# Patient Record
Sex: Female | Born: 1940 | Race: White | Hispanic: No | Smoking: Never smoker
Health system: Southern US, Community
[De-identification: ages and names within clinical notes are randomized; demographics above are authoritative.]

## PROBLEM LIST (undated history)

## (undated) DIAGNOSIS — I10 Essential (primary) hypertension: Secondary | ICD-10-CM

## (undated) DIAGNOSIS — M1990 Unspecified osteoarthritis, unspecified site: Secondary | ICD-10-CM

## (undated) DIAGNOSIS — R42 Dizziness and giddiness: Secondary | ICD-10-CM

## (undated) DIAGNOSIS — E785 Hyperlipidemia, unspecified: Secondary | ICD-10-CM

## (undated) DIAGNOSIS — K5730 Diverticulosis of large intestine without perforation or abscess without bleeding: Secondary | ICD-10-CM

## (undated) DIAGNOSIS — R002 Palpitations: Secondary | ICD-10-CM

## (undated) DIAGNOSIS — C801 Malignant (primary) neoplasm, unspecified: Secondary | ICD-10-CM

## (undated) DIAGNOSIS — Z86010 Personal history of colonic polyps: Secondary | ICD-10-CM

## (undated) DIAGNOSIS — R030 Elevated blood-pressure reading, without diagnosis of hypertension: Secondary | ICD-10-CM

## (undated) HISTORY — DX: Unspecified osteoarthritis, unspecified site: M19.90

## (undated) HISTORY — PX: ROTATOR CUFF REPAIR: SHX139

## (undated) HISTORY — DX: Hyperlipidemia, unspecified: E78.5

## (undated) HISTORY — PX: LAMINECTOMY: SHX219

## (undated) HISTORY — DX: Personal history of colonic polyps: Z86.010

## (undated) HISTORY — DX: Dizziness and giddiness: R42

## (undated) HISTORY — DX: Malignant (primary) neoplasm, unspecified: C80.1

## (undated) HISTORY — PX: ABDOMINAL HYSTERECTOMY: SHX81

## (undated) HISTORY — DX: Diverticulosis of large intestine without perforation or abscess without bleeding: K57.30

## (undated) HISTORY — DX: Elevated blood-pressure reading, without diagnosis of hypertension: R03.0

## (undated) HISTORY — PX: VAGINAL HYSTERECTOMY: SUR661

## (undated) HISTORY — DX: Palpitations: R00.2

---

## 2004-07-18 ENCOUNTER — Ambulatory Visit: Payer: Self-pay | Admitting: Internal Medicine

## 2004-09-05 ENCOUNTER — Ambulatory Visit: Payer: Self-pay | Admitting: Internal Medicine

## 2004-12-10 ENCOUNTER — Ambulatory Visit: Payer: Self-pay | Admitting: Internal Medicine

## 2005-01-30 ENCOUNTER — Ambulatory Visit: Payer: Self-pay | Admitting: Internal Medicine

## 2005-02-24 ENCOUNTER — Encounter: Admission: RE | Admit: 2005-02-24 | Discharge: 2005-02-24 | Payer: Self-pay | Admitting: Specialist

## 2005-03-13 ENCOUNTER — Ambulatory Visit: Payer: Self-pay | Admitting: Internal Medicine

## 2005-03-21 ENCOUNTER — Ambulatory Visit (HOSPITAL_COMMUNITY): Admission: RE | Admit: 2005-03-21 | Discharge: 2005-03-22 | Payer: Self-pay | Admitting: Specialist

## 2005-03-21 ENCOUNTER — Encounter (INDEPENDENT_AMBULATORY_CARE_PROVIDER_SITE_OTHER): Payer: Self-pay | Admitting: Specialist

## 2005-10-04 ENCOUNTER — Ambulatory Visit (HOSPITAL_COMMUNITY): Admission: RE | Admit: 2005-10-04 | Discharge: 2005-10-05 | Payer: Self-pay | Admitting: Specialist

## 2005-11-28 ENCOUNTER — Ambulatory Visit: Payer: Self-pay | Admitting: Internal Medicine

## 2006-06-21 ENCOUNTER — Emergency Department (HOSPITAL_COMMUNITY): Admission: EM | Admit: 2006-06-21 | Discharge: 2006-06-21 | Payer: Self-pay | Admitting: Emergency Medicine

## 2006-06-24 ENCOUNTER — Encounter: Payer: Self-pay | Admitting: Internal Medicine

## 2006-06-27 ENCOUNTER — Ambulatory Visit: Payer: Self-pay | Admitting: Internal Medicine

## 2006-06-27 LAB — CONVERTED CEMR LAB
Albumin: 3.7 g/dL (ref 3.5–5.2)
Basophils Absolute: 0 10*3/uL (ref 0.0–0.1)
Bilirubin, Direct: 0.1 mg/dL (ref 0.0–0.3)
Cholesterol: 254 mg/dL (ref 0–200)
Direct LDL: 149.4 mg/dL
Eosinophils Absolute: 0.1 10*3/uL (ref 0.0–0.6)
Hemoglobin: 13.5 g/dL (ref 12.0–15.0)
Lymphocytes Relative: 25.4 % (ref 12.0–46.0)
MCV: 91.1 fL (ref 78.0–100.0)
Monocytes Relative: 7.5 % (ref 3.0–11.0)
Neutro Abs: 4.2 10*3/uL (ref 1.4–7.7)
TSH: 2.41 microintl units/mL (ref 0.35–5.50)
Total CHOL/HDL Ratio: 4.4
Total Protein: 7.5 g/dL (ref 6.0–8.3)
Triglycerides: 127 mg/dL (ref 0–149)
VLDL: 25 mg/dL (ref 0–40)

## 2006-07-01 ENCOUNTER — Encounter: Payer: Self-pay | Admitting: Internal Medicine

## 2006-07-01 DIAGNOSIS — E785 Hyperlipidemia, unspecified: Secondary | ICD-10-CM

## 2006-07-01 HISTORY — DX: Hyperlipidemia, unspecified: E78.5

## 2006-09-26 ENCOUNTER — Telehealth: Payer: Self-pay | Admitting: *Deleted

## 2006-10-23 ENCOUNTER — Ambulatory Visit: Payer: Self-pay | Admitting: Internal Medicine

## 2006-10-23 LAB — CONVERTED CEMR LAB
ALT: 27 units/L (ref 0–35)
AST: 22 units/L (ref 0–37)
BUN: 13 mg/dL (ref 6–23)
Bilirubin Urine: NEGATIVE
CO2: 33 meq/L — ABNORMAL HIGH (ref 19–32)
Chloride: 108 meq/L (ref 96–112)
Cholesterol: 202 mg/dL (ref 0–200)
GFR calc Af Amer: 108 mL/min
GFR calc non Af Amer: 90 mL/min
HCT: 37.1 % (ref 36.0–46.0)
HDL: 60.2 mg/dL (ref >39.0)
Hemoglobin: 12.6 g/dL (ref 12.0–15.0)
Lymphocytes Relative: 29.9 % (ref 12.0–46.0)
Neutro Abs: 2.9 10*3/uL (ref 1.4–7.7)
Neutrophils Relative %: 58.9 % (ref 43.0–77.0)
RBC: 4.03 M/uL (ref 3.87–5.11)
RDW: 13.9 % (ref 11.5–14.6)
Specific Gravity, Urine: 1.02
Total Bilirubin: 0.7 mg/dL (ref 0.3–1.2)
Triglycerides: 134 mg/dL (ref 0–149)
WBC: 4.9 10*3/uL (ref 4.5–10.5)
pH: 6.5

## 2006-10-31 ENCOUNTER — Ambulatory Visit: Payer: Self-pay | Admitting: Internal Medicine

## 2006-10-31 DIAGNOSIS — Z860100 Personal history of colon polyps, unspecified: Secondary | ICD-10-CM

## 2006-10-31 HISTORY — DX: Personal history of colon polyps, unspecified: Z86.0100

## 2006-10-31 HISTORY — DX: Personal history of colonic polyps: Z86.010

## 2006-11-03 ENCOUNTER — Telehealth: Payer: Self-pay | Admitting: Internal Medicine

## 2006-11-14 ENCOUNTER — Ambulatory Visit: Payer: Self-pay | Admitting: Internal Medicine

## 2006-11-14 ENCOUNTER — Encounter: Payer: Self-pay | Admitting: Internal Medicine

## 2006-11-24 ENCOUNTER — Ambulatory Visit: Payer: Self-pay | Admitting: Internal Medicine

## 2006-11-24 ENCOUNTER — Encounter: Payer: Self-pay | Admitting: Internal Medicine

## 2007-01-29 ENCOUNTER — Ambulatory Visit: Payer: Self-pay | Admitting: Internal Medicine

## 2007-01-29 ENCOUNTER — Telehealth: Payer: Self-pay | Admitting: Internal Medicine

## 2007-01-29 DIAGNOSIS — R1013 Epigastric pain: Secondary | ICD-10-CM | POA: Insufficient documentation

## 2007-01-30 ENCOUNTER — Encounter: Admission: RE | Admit: 2007-01-30 | Discharge: 2007-01-30 | Payer: Self-pay | Admitting: Internal Medicine

## 2007-01-30 ENCOUNTER — Telehealth: Payer: Self-pay | Admitting: Internal Medicine

## 2007-04-29 ENCOUNTER — Telehealth: Payer: Self-pay | Admitting: Internal Medicine

## 2007-07-06 ENCOUNTER — Ambulatory Visit: Payer: Self-pay | Admitting: Internal Medicine

## 2007-08-20 ENCOUNTER — Telehealth: Payer: Self-pay | Admitting: Internal Medicine

## 2007-08-21 ENCOUNTER — Ambulatory Visit: Payer: Self-pay | Admitting: Internal Medicine

## 2007-08-21 DIAGNOSIS — M549 Dorsalgia, unspecified: Secondary | ICD-10-CM | POA: Insufficient documentation

## 2007-08-28 ENCOUNTER — Encounter: Payer: Self-pay | Admitting: Internal Medicine

## 2007-11-02 ENCOUNTER — Telehealth: Payer: Self-pay | Admitting: Internal Medicine

## 2007-11-06 ENCOUNTER — Ambulatory Visit: Payer: Self-pay | Admitting: Internal Medicine

## 2007-11-24 ENCOUNTER — Telehealth: Payer: Self-pay | Admitting: Internal Medicine

## 2007-12-08 ENCOUNTER — Telehealth: Payer: Self-pay | Admitting: Internal Medicine

## 2007-12-30 ENCOUNTER — Ambulatory Visit: Payer: Self-pay | Admitting: Internal Medicine

## 2007-12-30 ENCOUNTER — Telehealth: Payer: Self-pay | Admitting: Internal Medicine

## 2008-01-07 ENCOUNTER — Encounter: Payer: Self-pay | Admitting: Internal Medicine

## 2008-01-14 ENCOUNTER — Ambulatory Visit: Payer: Self-pay | Admitting: Internal Medicine

## 2008-01-14 DIAGNOSIS — J069 Acute upper respiratory infection, unspecified: Secondary | ICD-10-CM | POA: Insufficient documentation

## 2008-02-15 ENCOUNTER — Ambulatory Visit: Payer: Self-pay | Admitting: Internal Medicine

## 2008-02-16 ENCOUNTER — Telehealth: Payer: Self-pay | Admitting: Internal Medicine

## 2008-02-19 ENCOUNTER — Telehealth: Payer: Self-pay | Admitting: Internal Medicine

## 2008-04-07 ENCOUNTER — Telehealth: Payer: Self-pay | Admitting: Internal Medicine

## 2008-04-08 ENCOUNTER — Telehealth: Payer: Self-pay | Admitting: Internal Medicine

## 2008-10-06 ENCOUNTER — Telehealth: Payer: Self-pay | Admitting: Internal Medicine

## 2008-10-21 ENCOUNTER — Ambulatory Visit: Payer: Self-pay | Admitting: Internal Medicine

## 2008-10-21 DIAGNOSIS — R002 Palpitations: Secondary | ICD-10-CM

## 2008-10-21 DIAGNOSIS — K5730 Diverticulosis of large intestine without perforation or abscess without bleeding: Secondary | ICD-10-CM | POA: Insufficient documentation

## 2008-10-21 HISTORY — DX: Diverticulosis of large intestine without perforation or abscess without bleeding: K57.30

## 2008-10-21 HISTORY — DX: Palpitations: R00.2

## 2009-01-18 ENCOUNTER — Encounter: Payer: Self-pay | Admitting: Internal Medicine

## 2009-06-13 ENCOUNTER — Ambulatory Visit: Payer: Self-pay | Admitting: Internal Medicine

## 2009-06-13 DIAGNOSIS — J029 Acute pharyngitis, unspecified: Secondary | ICD-10-CM

## 2009-07-12 ENCOUNTER — Ambulatory Visit: Payer: Self-pay | Admitting: Family Medicine

## 2009-07-12 DIAGNOSIS — R030 Elevated blood-pressure reading, without diagnosis of hypertension: Secondary | ICD-10-CM

## 2009-07-12 HISTORY — DX: Elevated blood-pressure reading, without diagnosis of hypertension: R03.0

## 2009-07-25 ENCOUNTER — Ambulatory Visit: Payer: Self-pay | Admitting: Family Medicine

## 2009-07-25 DIAGNOSIS — R42 Dizziness and giddiness: Secondary | ICD-10-CM | POA: Insufficient documentation

## 2009-07-25 DIAGNOSIS — H6120 Impacted cerumen, unspecified ear: Secondary | ICD-10-CM

## 2009-07-25 HISTORY — DX: Dizziness and giddiness: R42

## 2009-10-16 ENCOUNTER — Telehealth: Payer: Self-pay | Admitting: Internal Medicine

## 2009-10-16 ENCOUNTER — Ambulatory Visit: Payer: Self-pay | Admitting: Internal Medicine

## 2009-11-30 ENCOUNTER — Ambulatory Visit: Payer: Self-pay | Admitting: Internal Medicine

## 2009-12-18 ENCOUNTER — Telehealth: Payer: Self-pay | Admitting: Internal Medicine

## 2009-12-20 ENCOUNTER — Ambulatory Visit: Payer: Self-pay | Admitting: Internal Medicine

## 2009-12-20 DIAGNOSIS — M79609 Pain in unspecified limb: Secondary | ICD-10-CM

## 2009-12-25 ENCOUNTER — Telehealth: Payer: Self-pay | Admitting: Internal Medicine

## 2010-02-06 ENCOUNTER — Ambulatory Visit
Admission: RE | Admit: 2010-02-06 | Discharge: 2010-02-06 | Payer: Self-pay | Source: Home / Self Care | Attending: Internal Medicine | Admitting: Internal Medicine

## 2010-02-06 ENCOUNTER — Other Ambulatory Visit: Payer: Self-pay | Admitting: Internal Medicine

## 2010-02-06 ENCOUNTER — Encounter: Payer: Self-pay | Admitting: Internal Medicine

## 2010-02-06 DIAGNOSIS — E559 Vitamin D deficiency, unspecified: Secondary | ICD-10-CM | POA: Insufficient documentation

## 2010-02-06 LAB — CBC WITH DIFFERENTIAL/PLATELET
Basophils Absolute: 0 10*3/uL (ref 0.0–0.1)
Basophils Relative: 0.6 % (ref 0.0–3.0)
Eosinophils Absolute: 0.1 10*3/uL (ref 0.0–0.7)
Eosinophils Relative: 2.1 % (ref 0.0–5.0)
HCT: 39.3 % (ref 36.0–46.0)
Hemoglobin: 13.4 g/dL (ref 12.0–15.0)
Lymphocytes Relative: 19.6 % (ref 12.0–46.0)
Lymphs Abs: 1 10*3/uL (ref 0.7–4.0)
MCHC: 34.1 g/dL (ref 30.0–36.0)
MCV: 92.9 fl (ref 78.0–100.0)
Monocytes Absolute: 0.3 10*3/uL (ref 0.1–1.0)
Monocytes Relative: 6.9 % (ref 3.0–12.0)
Neutro Abs: 3.5 10*3/uL (ref 1.4–7.7)
Neutrophils Relative %: 70.8 % (ref 43.0–77.0)
Platelets: 300 10*3/uL (ref 150.0–400.0)
RBC: 4.24 Mil/uL (ref 3.87–5.11)
RDW: 14.5 % (ref 11.5–14.6)
WBC: 5 10*3/uL (ref 4.5–10.5)

## 2010-02-06 LAB — BASIC METABOLIC PANEL
BUN: 14 mg/dL (ref 6–23)
CO2: 30 mEq/L (ref 19–32)
Calcium: 9 mg/dL (ref 8.4–10.5)
Chloride: 105 mEq/L (ref 96–112)
Creatinine, Ser: 0.5 mg/dL (ref 0.4–1.2)
GFR: 119.36 mL/min (ref >60.00)
Glucose, Bld: 87 mg/dL (ref 70–99)
Potassium: 4.3 mEq/L (ref 3.5–5.1)
Sodium: 143 mEq/L (ref 135–145)

## 2010-02-06 LAB — HEPATIC FUNCTION PANEL
ALT: 19 U/L (ref 0–35)
AST: 18 U/L (ref 0–37)
Albumin: 3.9 g/dL (ref 3.5–5.2)
Alkaline Phosphatase: 73 U/L (ref 39–117)
Bilirubin, Direct: 0.1 mg/dL (ref 0.0–0.3)
Total Bilirubin: 0.7 mg/dL (ref 0.3–1.2)
Total Protein: 7.2 g/dL (ref 6.0–8.3)

## 2010-02-06 LAB — LIPID PANEL
Cholesterol: 231 mg/dL — ABNORMAL HIGH (ref 0–200)
HDL: 72.3 mg/dL (ref >39.00)
Total CHOL/HDL Ratio: 3
Triglycerides: 91 mg/dL (ref 0.0–149.0)
VLDL: 18.2 mg/dL (ref 0.0–40.0)

## 2010-02-06 LAB — TSH: TSH: 3.4 u[IU]/mL (ref 0.35–5.50)

## 2010-02-06 LAB — LDL CHOLESTEROL, DIRECT: Direct LDL: 145.3 mg/dL

## 2010-02-09 LAB — CONVERTED CEMR LAB: Vit D, 25-Hydroxy: 22 ng/mL — ABNORMAL LOW (ref 30–89)

## 2010-02-11 ENCOUNTER — Encounter: Payer: Self-pay | Admitting: Orthopedic Surgery

## 2010-02-11 ENCOUNTER — Encounter: Payer: Self-pay | Admitting: Specialist

## 2010-02-18 LAB — CONVERTED CEMR LAB
ALT: 28 units/L (ref 0–35)
AST: 21 units/L (ref 0–37)
Basophils Absolute: 0 10*3/uL (ref 0.0–0.1)
Bilirubin, Direct: 0.1 mg/dL (ref 0.0–0.3)
Creatinine, Ser: 0.5 mg/dL (ref 0.4–1.2)
Eosinophils Relative: 1.9 % (ref 0.0–5.0)
GFR calc non Af Amer: 130.96 mL/min (ref >60)
Glucose, Bld: 89 mg/dL (ref 70–99)
HDL: 64.5 mg/dL (ref >39.00)
Hemoglobin: 13.7 g/dL (ref 12.0–15.0)
Lymphocytes Relative: 25.7 % (ref 12.0–46.0)
MCV: 93.5 fL (ref 78.0–100.0)
Neutrophils Relative %: 65.3 % (ref 43.0–77.0)
Platelets: 278 10*3/uL (ref 150.0–400.0)
RBC: 4.36 M/uL (ref 3.87–5.11)
TSH: 2.53 microintl units/mL (ref 0.35–5.50)
Total Bilirubin: 0.6 mg/dL (ref 0.3–1.2)
Total Protein: 7.7 g/dL (ref 6.0–8.3)
Triglycerides: 85 mg/dL (ref 0.0–149.0)
Vit D, 25-Hydroxy: 28 ng/mL — ABNORMAL LOW (ref 30–89)

## 2010-02-20 NOTE — Progress Notes (Signed)
Summary: tetanus  Phone Note Call from Patient Call back at Work Phone [phone]   Caller: vm Summary of Call: Larey Seat on grocery cart yessterday & broke skin on leg.  Let me know if I need tetanus shot.  None on record.  Patient coming at 4:30 from [place].  Rudy Jew, RN  October 16, 2009 2:51 PM  Initial call taken by: Rudy Jew, RN,  October 16, 2009 2:44 PM

## 2010-02-20 NOTE — Assessment & Plan Note (Signed)
Summary: elevated BP/dm   Vital Signs:  Patient profile:   70 year old female Height:      68 inches (172.72 cm) Weight:      186 pounds (84.55 kg) O2 Sat:      98 % on Room air Temp:     98.1 degrees F (36.72 degrees C) oral Pulse rate:   69 / minute BP sitting:   150 / 90  (left arm) Cuff size:   regular  Vitals Entered By: Kathrynn Speed CMA (July 12, 2009 3:33 PM)  O2 Flow:  Room air  Serial Vital Signs/Assessments:  Time      Position  BP       Pulse  Resp  Temp     By                     124/84                         Danise Edge MD                     136/84                         Danise Edge MD  CC: Elevated BP x 2 days/ light headed /shakey not on going/ headacehes/ src CBG Result 111   History of Present Illness: Patient in for concerns regarding recent malaise and elevated BP. She has been struggling with low grade congestion and irritation in throat lately. She has noted some mild facial congestion/mild HA and so she checked her BP yesterday twice and once got a systolic of 145 and then got 162 later. She also notes a sense of feeling weak and very slightly woozey, has had a sense of a slight film over her eyes, denies any discharge or pruritis.  She has been seeing an opthamologist recently due to some issues with visual fields floaters. These have now resolved. She is quick to point out that she has been under a great deal of stress lately. Her husband of 22 years is in the advanced stages of Alzheimer's disease and stays at a SNF for care at this time. She still goes to visit him 1-2 x daily and reports an episode lately where she got very upset with the staff for the way they were talking to the residents and she actually considered removing him. In general though she is very well aware of how important it is for him to be there. She still reports quality sleep, denies anhedonia and denies difficulty with concentration. She does admit to crying easily on most days but  does not feel she is overly depressed. She has tried support groups to help with caregivers of Alzheimer's patients but does not find them helpful  Current Medications (verified): 1)  Crestor 5 Mg Tabs (Rosuvastatin Calcium) .Marland Kitchen.. 1 Once Daily  Allergies (verified): No Known Drug Allergies  Past History:  Past medical history reviewed for relevance to current acute and chronic problems. Social history (including risk factors) reviewed for relevance to current acute and chronic problems.  Past Medical History: Reviewed history from 10/21/2008 and no changes required. Hyperlipidemia Colonic polyps, hx of palpitations Diverticulosis, colon  Social History: Reviewed history from 10/31/2006 and no changes required. married and lives with her husband has advanced dementia  Review of Systems      See HPI  Physical Exam  General:  Well-developed,well-nourished,in no acute distress; alert,appropriate and cooperative throughout examination Head:  Normocephalic and atraumatic without obvious abnormalities.  Eyes:  No corneal or conjunctival inflammation noted. EOMI. Perrla. Funduscopic exam benign, without hemorrhages, exudates or papilledema. Vision grossly normal. Ears:  External ear exam shows no significant lesions or deformities.  Otoscopic examination reveals clear canals, tympanic membranes are intact bilaterally without bulging, retraction, inflammation or discharge. Hearing is grossly normal bilaterally. Slight non obstructing cerumen b/l canals Nose:  mucosal erythema.   Mouth:  Oral mucosa and oropharynx without lesions or exudates.  Teeth in good repair. Neck:  No deformities, masses, or tenderness noted. Lungs:  Normal respiratory effort, chest expands symmetrically. Lungs are clear to auscultation, no crackles or wheezes. Heart:  Normal rate and regular rhythm. S1 and S2 normal without gallop, murmur, click, rub or other extra sounds. Abdomen:  Bowel sounds positive,abdomen  soft and non-tender without masses, organomegaly or hernias noted. Extremities:  No clubbing, cyanosis, edema Neurologic:  No cranial nerve deficits noted. Station and gait are normal. Plantar reflexes are down-going bilaterally. DTRs are symmetrical throughout. Sensory, motor and coordinative functions appear intact. Cervical Nodes:  No lymphadenopathy noted Psych:  Cognition and judgment appear intact. Alert and cooperative with normal attention span and concentration. No apparent delusions, illusions, hallucinations   Impression & Recommendations:  Problem # 1:  SORE THROAT (ICD-462) Appears to  be recovering from a mild viral illness, she is encouraged to gradually decrease her visits to the nursing home to visit her husband and to put more energy into her health maintenance and she agrees. Report worsening symptoms, consider OTC antihistamine if sinus pressure and throat irritation escalate.  Problem # 2:  PALPITATIONS, OCCASIONAL (ICD-785.1) Not occurring frequently and without associated symptoms, but does tend to occur when she is stressed, would rec she try Ativan and use it sparingly under stressful conditions, do not drive after taking and if use increases consider daily SSRI  Problem # 3:  ELEVATED BP READING WITHOUT DX HYPERTENSION (ICD-796.2) Avoid sodium, caffeine as much as possible, cont with moderate exercise and a good sleeping regimen. Check bp after resting 15 min weekly and thenreturn for bp check in one month. If any concerns call or seek immediate medical care  Complete Medication List: 1)  Crestor 5 Mg Tabs (Rosuvastatin calcium) .Marland Kitchen.. 1 once daily 2)  Lorazepam 0.5 Mg Tabs (Lorazepam) .Marland Kitchen.. 1 tab by mouth daily as needed for anxiety/palpitations or insomnia  Patient Instructions: 1)  Please schedule a follow-up appointment in 1 month for BP check. Avoid excessive sodium and caffeine. Keep up with regular exercise and get 7-8 hours sleep nightly. Use Lorazepam as needed  for anxiety/insomnia/palpitations. Prescriptions: LORAZEPAM 0.5 MG TABS (LORAZEPAM) 1 tab by mouth daily as needed for anxiety/palpitations or insomnia  #20 x 0   Entered and Authorized by:   Danise Edge MD   Signed by:   Danise Edge MD on 07/12/2009   Method used:   Print then Give to Patient   RxID:   [id number]   Laboratory Results   Blood Tests     CBG Random:: 111mg /dL

## 2010-02-20 NOTE — Progress Notes (Signed)
Summary: low back pain  Phone Note Call from Patient Call back at Work Phone [phone]   Caller: Patient Call For: Gordy Savers  MD Summary of Call: Pt would like to have a handicapped sticker due to low back pain. Initial call taken by: Lynann Beaver CMA AAMA,  December 18, 2009 1:34 PM  Follow-up for Phone Call        ok-will fill out at next [facility] Follow-up by: Gordy Savers  MD,  December 18, 2009 5:23 PM  Additional Follow-up for Phone Call Additional follow up Details #1::        Pt is requesting to move appt up.  Additional Follow-up by: Lynann Beaver CMA AAMA,  December 19, 2009 10:49 AM

## 2010-02-20 NOTE — Progress Notes (Signed)
Summary: sinus infect  Phone Note Call from Patient   Caller: Patient Call For: Gordy Savers  MD Reason for Call: Acute Illness Summary of Call: Pt is asking for Rx for sinus infection.......Marland Kitchenheadache, nasal congestion........swollen around eyes and nose.   [place] [phone] Initial call taken by: Lynann Beaver CMA AAMA,  December 25, 2009 12:09 PM  Follow-up for Phone Call        mucinex d  one  twice daily;  saline irrigation  prn Follow-up by: Gordy Savers  MD,  December 25, 2009 12:22 PM  Additional Follow-up for Phone Call Additional follow up Details #1::        Notified pt. Additional Follow-up by: Lynann Beaver CMA AAMA,  December 25, 2009 12:25 PM

## 2010-02-20 NOTE — Assessment & Plan Note (Signed)
Summary: needs handicapped sticker/dm   Vital Signs:  Patient profile:   70 year old female Weight:      189 pounds Temp:     97.8 degrees F oral BP sitting:   120 / 78  (right arm) Cuff size:   regular  Vitals Entered By: Duard Brady LPN (December 20, 2009 10:50 AM) CC: c/o back pain  , check "knot" inner (R) foot Is Patient Diabetic? No   CC:  c/o back pain   and check "knot" inner (R) foot.  History of Present Illness: 70 year old patient who is seen today complaining of right medial foot pain.  She does have a prior history of a laminectomy and chronic left lower extremity postoperative numbness.  Pain is been present intermittently for several days.  She has not taken any anti-inflammatory medication.  She is on Crestor and scheduled for a comprehensive evaluation in two months  Allergies (verified): No Known Drug Allergies  Past History:  Past Medical History: Reviewed history from 10/21/2008 and no changes required. Hyperlipidemia Colonic polyps, hx of palpitations Diverticulosis, colon  Review of Systems       The patient complains of difficulty walking.  The patient denies anorexia, fever, weight loss, weight gain, vision loss, decreased hearing, hoarseness, chest pain, syncope, dyspnea on exertion, peripheral edema, prolonged cough, headaches, hemoptysis, abdominal pain, melena, hematochezia, severe indigestion/heartburn, hematuria, incontinence, genital sores, muscle weakness, suspicious skin lesions, transient blindness, depression, unusual weight change, abnormal bleeding, enlarged lymph nodes, angioedema, and breast masses.    Physical Exam  General:  Well-developed,well-nourished,in no acute distress; alert,appropriate and cooperative throughout examination; normal blood pressure Extremities:  some mild the soft tissue swelling and tenderness along the medial aspect of the right midfoot area   Impression & Recommendations:  Problem # 1:  FOOT PAIN,  RIGHT (ICD-729.5)  Problem # 2:  HYPERLIPIDEMIA (ICD-272.4)  Her updated medication list for this problem includes:    Crestor 5 Mg Tabs (Rosuvastatin calcium) .Marland Kitchen... 1 once daily  Her updated medication list for this problem includes:    Crestor 5 Mg Tabs (Rosuvastatin calcium) .Marland Kitchen... 1 once daily  Complete Medication List: 1)  Crestor 5 Mg Tabs (Rosuvastatin calcium) .Marland Kitchen.. 1 once daily 2)  Lorazepam 0.5 Mg Tabs (Lorazepam) .Marland Kitchen.. 1 tab by mouth daily as needed for anxiety/palpitations or insomnia  Patient Instructions: 1)  annual examination as scheduled 2)  Take 400-600mg  of Ibuprofen (Advil, Motrin) with food every 4-6 hours as needed for relief of pain or comfort of fever. Prescriptions: CRESTOR 5 MG TABS (ROSUVASTATIN CALCIUM) 1 once daily  #90 x 6   Entered and Authorized by:   Gordy Savers  MD   Signed by:   Gordy Savers  MD on 12/20/2009   Method used:   Electronically to        [facility]* (retail)       [address]       [place], [place]  [id number]       Ph: [phone]       Fax: [phone]   RxID:   [id number]    Orders Added: 1)  Est. Patient Level III [30160]

## 2010-02-20 NOTE — Assessment & Plan Note (Signed)
Summary: sore throat/dm   Vital Signs:  Patient profile:   70 year old female Weight:      188 pounds Temp:     97.8 degrees F oral BP sitting:   118 / 68  (right arm) Cuff size:   regular  Vitals Entered By: Duard Brady LPN (Jun 13, 2009 5:00 PM) CC: c/o sore throat only Is Patient Diabetic? No   CC:  c/o sore throat only.  History of Present Illness: 70 year old patient who presents with the 3 day history of sore throat and malaise.  She will be caring for her grandchildren later and was concerned about the possibility of a strep pharyngitis.  Denies any fever or cervical adenopathy.  No prior history of strep.  Only medical regimen includes Crestor 5 mg daily  Preventive Screening-Counseling & Management  Alcohol-Tobacco     Smoking Status: never  Allergies (verified): No Known Drug Allergies  Past History:  Past Medical History: Reviewed history from 10/21/2008 and no changes required. Hyperlipidemia Colonic polyps, hx of palpitations Diverticulosis, colon  Review of Systems  The patient denies anorexia, fever, weight loss, weight gain, vision loss, decreased hearing, hoarseness, chest pain, syncope, dyspnea on exertion, peripheral edema, prolonged cough, headaches, hemoptysis, abdominal pain, melena, hematochezia, severe indigestion/heartburn, hematuria, incontinence, genital sores, muscle weakness, suspicious skin lesions, transient blindness, difficulty walking, depression, unusual weight change, abnormal bleeding, enlarged lymph nodes, angioedema, and breast masses.    Physical Exam  General:  Well-developed,well-nourished,in no acute distress; alert,appropriate and cooperative throughout examination Head:  Normocephalic and atraumatic without obvious abnormalities. No apparent alopecia or balding. Eyes:  No corneal or conjunctival inflammation noted. EOMI. Perrla. Funduscopic exam benign, without hemorrhages, exudates or papilledema. Vision grossly  normal. Ears:  External ear exam shows no significant lesions or deformities.  Otoscopic examination reveals clear canals, tympanic membranes are intact bilaterally without bulging, retraction, inflammation or discharge. Hearing is grossly normal bilaterally. Mouth:  pharyngeal erythema.   Neck:  No deformities, masses, or tenderness noted. Lungs:  Normal respiratory effort, chest expands symmetrically. Lungs are clear to auscultation, no crackles or wheezes. Heart:  Normal rate and regular rhythm. S1 and S2 normal without gallop, murmur, click, rub or other extra sounds.   Impression & Recommendations:  Problem # 1:  SORE THROAT (ICD-462)  Orders: Rapid Strep (16109)  Problem # 2:  HYPERLIPIDEMIA (ICD-272.4)  Her updated medication list for this problem includes:    Crestor 5 Mg Tabs (Rosuvastatin calcium) .Marland Kitchen... 1 once daily  Complete Medication List: 1)  Crestor 5 Mg Tabs (Rosuvastatin calcium) .Marland Kitchen.. 1 once daily  Patient Instructions: 1)  Take 400-600mg  of Ibuprofen (Advil, Motrin) with food every 4-6 hours as needed for relief of pain or comfort of fever.

## 2010-02-20 NOTE — Assessment & Plan Note (Signed)
Summary: TETANUS/PS  Nurse Visit   Vitals Entered By: Duard Brady LPN (October 16, 2009 4:40 PM)  Allergies: No Known Drug Allergies  Immunizations Administered:  Tetanus Vaccine:    Vaccine Type: Tdap    Site: left deltoid    Mfr: GlaxoSmithKline    Dose: 0.5 ml    Route: IM    Given by: Duard Brady LPN    Exp. Date: 11/10/2011    Lot #: [id number]    VIS given: 12/09/07 version given October 16, 2009.    Physician counseled: yes  Orders Added: 1)  Tdap => 45yrs IM [90715] 2)  Admin 1st Vaccine [09811]

## 2010-02-20 NOTE — Assessment & Plan Note (Signed)
Summary: flu shot ok per kim/njr  Nurse Visit Flu Vaccine Consent Questions     Do you have a history of severe allergic reactions to this vaccine? no    Any prior history of allergic reactions to egg and/or gelatin? no    Do you have a sensitivity to the preservative Thimersol? no    Do you have a past history of Guillan-Barre Syndrome? no    Do you currently have an acute febrile illness? no    Have you ever had a severe reaction to latex? no    Vaccine information given and explained to patient? yes    Are you currently pregnant? no    Lot Number:[id number]   Exp Date:07/21/2010   Site Given  Left Deltoid IM   Vitals Entered By: Duard Brady LPN (November 30, 2009 4:05 PM)  Allergies: No Known Drug Allergies  Orders Added: 1)  Flu Vaccine 47yrs + MEDICARE PATIENTS [Q2039] 2)  Administration Flu vaccine - MCR [G0008]

## 2010-02-20 NOTE — Assessment & Plan Note (Signed)
Summary: F/U ON BP // RS/dizziness//ccm   Vital Signs:  Patient profile:   70 year old female Height:      68 inches (172.72 cm) Weight:      188 pounds (85.45 kg) BMI:     28.69 O2 Sat:      96 % on Room air Temp:     97.6 degrees F (36.44 degrees C) oral Pulse rate:   61 / minute BP sitting:   142 / 92  (left arm) Cuff size:   regular  Vitals Entered By: Josph Macho RMA (July 25, 2009 11:18 AM)  O2 Flow:  Room air CC: Follow-up visit on BP- dizziness, light headed/ sore throat X1 month/ right ear feels "stopped up"/ CF Is Patient Diabetic? No   History of Present Illness: Saturday morning (3 days ago) onset of vertigo.  Lasted about one hour.  Describes one month hx of some dizziness which sounds like mild transient vertigo over the past month. Triggered by looking down. Occ slight nausea.  No hearing loss.  Rare mild headaches.  Dizzy symptoms come and go. No alleviating factors.  No orthostatic symptoms. No nasal congestion.  Some sore throat off and on for one month. No fever.  Has noticed frequent "sniffing".  No eye symptoms. Also complains of some bil ear fullness recently.  No hearing loss and no tinnitus reported.  Recent elevated blood pressure.  Stress of caring for her husband with Alzheimers disease.  Current Medications (verified): 1)  Crestor 5 Mg Tabs (Rosuvastatin Calcium) .Marland Kitchen.. 1 Once Daily 2)  Lorazepam 0.5 Mg Tabs (Lorazepam) .Marland Kitchen.. 1 Tab By Mouth Daily As Needed For Anxiety/palpitations or Insomnia  Allergies (verified): No Known Drug Allergies  Past History:  Past Medical History: Last updated: 10/21/2008 Hyperlipidemia Colonic polyps, hx of palpitations Diverticulosis, colon  Social History: Last updated: 10/31/2006 married and lives with her husband has advanced dementia PMH reviewed for relevance, SH/Risk Factors reviewed for relevance  Review of Systems  The patient denies anorexia, fever, weight loss, vision loss, decreased hearing,  chest pain, syncope, dyspnea on exertion, peripheral edema, headaches, muscle weakness, difficulty walking, and depression.    Physical Exam  General:  Well-developed,well-nourished,in no acute distress; alert,appropriate and cooperative throughout examination Head:  Normocephalic and atraumatic without obvious abnormalities. No apparent alopecia or balding. Eyes:  pupils equal, pupils round, and pupils reactive to light.   Ears:  bilateral cerumen in canals-irrigated with total removal and TMs appear normal. Nose:  External nasal examination shows no deformity or inflammation. Nasal mucosa are pink and moist without lesions or exudates. Mouth:  Oral mucosa and oropharynx without lesions or exudates.  Teeth in good repair. Neck:  No deformities, masses, or tenderness noted. Lungs:  Normal respiratory effort, chest expands symmetrically. Lungs are clear to auscultation, no crackles or wheezes. Heart:  normal rate and regular rhythm.   Neurologic:  alert & oriented X3, cranial nerves II-XII intact, strength normal in all extremities, sensation intact to light touch, gait normal, and finger-to-nose normal.   Cervical Nodes:  no anterior cervical adenopathy and R posterior LN tender.   Psych:  normally interactive, good eye contact, not depressed appearing, and slightly anxious.     Impression & Recommendations:  Problem # 1:  SORE THROAT (ICD-462) ?allergic postnasal drip.  Pt to try Allegra and follow up with primary if not improving next couple of weeks.  Problem # 2:  VERTIGO (ICD-780.4) Assessment: New nonfocal neuro exam.  Symptoms very transient.  ?BPV.  Observe.  Consider Epley maneuvers if symptoms continue.  Problem # 3:  CERUMEN IMPACTION (ICD-380.4) Assessment: Improved  Orders: Cerumen Impaction Removal (16109)  Problem # 4:  ELEVATED BP READING WITHOUT DX HYPERTENSION (ICD-796.2) stable.  Complete Medication List: 1)  Crestor 5 Mg Tabs (Rosuvastatin calcium) .Marland Kitchen.. 1 once  daily 2)  Lorazepam 0.5 Mg Tabs (Lorazepam) .Marland Kitchen.. 1 tab by mouth daily as needed for anxiety/palpitations or insomnia  Patient Instructions: 1)  Take Allegra one daily and follow up with Dr Kirtland Bouchard if sore throat or dizzy symptoms persist in one to two week.

## 2010-02-22 NOTE — Assessment & Plan Note (Signed)
Summary: CPX (PT WILL COME IN FASTING) // RS   Vital Signs:  Patient profile:   70 year old female Weight:      182 pounds Temp:     98.1 degrees F oral BP sitting:   114 / 72  (right arm) Cuff size:   regular  Vitals Entered By: Duard Brady LPN (February 06, 2010 8:42 AM) CC: cpx - doing well Is Patient Diabetic? No   CC:  cpx - doing well.  History of Present Illness: 70 year old patient who is seen today for a preventive health examination.  She is followed by OB/GYN at [facility].  She is a prior hysterectomy and has had a recent gynecologic exam.  Last week.  She has a history of occasional palpitations that have not been bothersome of late.  She is on Crestor for dyslipidemia, and also has a history colonic polyps.  She is up-to-date on her colonoscopies.  She otherwise is asymptomatic. Her only other concern is left foot pain.  [facility]  Here for Medicare AWV:  1.   Risk factors based on Past M, S, F history:  cardiovascular risk factors include dyslipidemia 2.   Physical Activities: remains active without exercise limitation 3.   Depression/mood: no history depression, or mood disorder, has had some mild anxiety issues in the past due to situational stressors, but stable at present 4.   Hearing: no deficits 5.   ADL's: independent in all aspects of daily living.  Still works full-time 6.   Fall Risk: low 7.   Home Safety: no problems  identifying 8.   Height, weight, &visual acuity:height and weight stable.  No change in visual acuity 9.   Counseling: mammogram will be obtained next month follow-up colonoscopy when due encouraged 10.   Labs ordered based on risk factors: laboratory studies will be reviewed, including lipid panel.  A vitamin D level.  Also be checked in view of her history of vitamin D deficiency 11.           Referral Coordination- eye examination scheduled for later this month 12.           Care Plan- heart  healthy diet, calcium and vitamin D supplementation, and regular.  Exercise, all encouraged.  Modest weight loss encouraged 13.            Cognitive Assessment- alert and oriented, with normal affect, handles all  executive functioning- husband has advanced dementia   Allergies (verified): No Known Drug Allergies  Past History:  Past Medical History: Reviewed history from 10/21/2008 and no changes required. Hyperlipidemia Colonic polyps, hx of palpitations Diverticulosis, colon  Past Surgical History: Hysterectomy  [facility] Lumbar laminectomy Rotator cuff repair colonoscopy  12/09  Family History: Reviewed history from 10/31/2006 and no changes required. father recently died at 23.  Mother died age 56.  History of COPD, diabetes one brother, status post melanoma.  One sister with diabetes s/p stent   Social History: Reviewed history from 10/31/2006 and no changes required. married and lives with her husband has advanced dementia  Review of Systems  The patient denies anorexia, fever, weight loss, weight gain, vision loss, decreased hearing, hoarseness, chest pain, syncope, dyspnea on exertion, peripheral edema, prolonged cough, headaches, hemoptysis, abdominal pain, melena, hematochezia, severe indigestion/heartburn, hematuria, incontinence, genital sores, muscle weakness, suspicious skin lesions, transient blindness, difficulty walking, depression, unusual weight change, abnormal bleeding, enlarged lymph nodes, angioedema, and breast masses.  Physical Exam  General:  Well-developed,well-nourished,in no acute distress; alert,appropriate and cooperative throughout examination; 112/72 Head:  Normocephalic and atraumatic without obvious abnormalities. No apparent alopecia or balding. Eyes:  No corneal or conjunctival inflammation noted. EOMI. Perrla. Funduscopic exam benign, without hemorrhages, exudates or papilledema. Vision grossly normal. Ears:  External ear exam shows  no significant lesions or deformities.  Otoscopic examination reveals clear canals, tympanic membranes are intact bilaterally without bulging, retraction, inflammation or discharge. Hearing is grossly normal bilaterally. Nose:  External nasal examination shows no deformity or inflammation. Nasal mucosa are pink and moist without lesions or exudates. Mouth:  Oral mucosa and oropharynx without lesions or exudates.  Teeth in good repair. Neck:  No deformities, masses, or tenderness noted. Chest Wall:  No deformities, masses, or tenderness noted. Breasts:  No mass, nodules, thickening, tenderness, bulging, retraction, inflamation, nipple discharge or skin changes noted.   Lungs:  Normal respiratory effort, chest expands symmetrically. Lungs are clear to auscultation, no crackles or wheezes. Heart:  Normal rate and regular rhythm. S1 and S2 normal without gallop, murmur, click, rub or other extra sounds. Abdomen:  Bowel sounds positive,abdomen soft and non-tender without masses, organomegaly or hernias noted. Msk:  No deformity or scoliosis noted of thoracic or lumbar spine.   Pulses:  R and L carotid,radial,femoral,dorsalis pedis and posterior tibial pulses are full and equal bilaterally Extremities:  No clubbing, cyanosis, edema, or deformity noted with normal full range of motion of all joints.   Neurologic:  No cranial nerve deficits noted. Station and gait are normal. Plantar reflexes are down-going bilaterally. DTRs are symmetrical throughout. Sensory, motor and coordinative functions appear intact. Skin:  Intact without suspicious lesions or rashes Cervical Nodes:  No lymphadenopathy noted Axillary Nodes:  No palpable lymphadenopathy Inguinal Nodes:  No significant adenopathy Psych:  Cognition and judgment appear intact. Alert and cooperative with normal attention span and concentration. No apparent delusions, illusions, hallucinations   Impression & Recommendations:  Problem # 1:   PALPITATIONS, OCCASIONAL (ICD-785.1)  Orders: EKG w/ Interpretation (93000) TLB-BMP (Basic Metabolic Panel-BMET) (80048-METABOL) TLB-CBC Platelet - w/Differential (85025-CBCD) TLB-Hepatic/Liver Function Pnl (80076-HEPATIC)  Problem # 2:  COLONIC POLYPS, HX OF (ICD-V12.72)  Orders: TLB-BMP (Basic Metabolic Panel-BMET) (80048-METABOL) TLB-CBC Platelet - w/Differential (85025-CBCD) TLB-Hepatic/Liver Function Pnl (80076-HEPATIC)  Problem # 3:  HYPERLIPIDEMIA (ICD-272.4)  Her updated medication list for this problem includes:    Crestor 5 Mg Tabs (Rosuvastatin calcium) .Marland Kitchen... 1 once daily  Orders: Venipuncture (16109) TLB-Lipid Panel (80061-LIPID) TLB-BMP (Basic Metabolic Panel-BMET) (80048-METABOL) TLB-CBC Platelet - w/Differential (85025-CBCD) TLB-Hepatic/Liver Function Pnl (80076-HEPATIC) TLB-TSH (Thyroid Stimulating Hormone) (84443-TSH)  Problem # 4:  VITAMIN D DEFICIENCY (ICD-268.9)  Orders: Venipuncture (60454) TLB-Lipid Panel (80061-LIPID) TLB-BMP (Basic Metabolic Panel-BMET) (80048-METABOL) TLB-CBC Platelet - w/Differential (85025-CBCD) TLB-Hepatic/Liver Function Pnl (80076-HEPATIC) TLB-TSH (Thyroid Stimulating Hormone) (84443-TSH) T-Vitamin D (25-Hydroxy) (09811-91478) Specimen Handling (29562)  Complete Medication List: 1)  Crestor 5 Mg Tabs (Rosuvastatin calcium) .Marland Kitchen.. 1 once daily 2)  Lorazepam 0.5 Mg Tabs (Lorazepam) .Marland Kitchen.. 1 tab by mouth daily as needed for anxiety/palpitations or insomnia  Other Orders: Medicare -1st Annual Wellness Visit [phone])  Patient Instructions: 1)  Please schedule a follow-up appointment in 1 year. 2)  Limit your Sodium (Salt). 3)  It is important that you exercise regularly at least 20 minutes 5 times a week. If you develop chest pain, have severe difficulty breathing, or feel very tired , stop exercising immediately and seek medical attention. 4)  You need to lose weight. Consider a lower calorie diet and  regular exercise.  5)  Take  calcium +Vitamin D daily. Prescriptions: CRESTOR 5 MG TABS (ROSUVASTATIN CALCIUM) 1 once daily  #90 x 6   Entered and Authorized by:   Gordy Savers  MD   Signed by:   Gordy Savers  MD on 02/06/2010   Method used:   Electronically to        [facility]* (retail)       [address]       [place], [place]  [id number]       Ph: [phone]       Fax: [phone]   RxID:   [id number]    Orders Added: 1)  EKG w/ Interpretation [93000] 2)  Medicare -1st Annual Wellness Visit [G0438] 3)  Est. Patient Level III [24401] 4)  Venipuncture [36415] 5)  TLB-Lipid Panel [80061-LIPID] 6)  TLB-BMP (Basic Metabolic Panel-BMET) [80048-METABOL] 7)  TLB-CBC Platelet - w/Differential [85025-CBCD] 8)  TLB-Hepatic/Liver Function Pnl [80076-HEPATIC] 9)  TLB-TSH (Thyroid Stimulating Hormone) [84443-TSH] 10)  T-Vitamin D (25-Hydroxy) [02725-36644] 11)  Specimen Handling [99000]

## 2010-06-08 NOTE — Op Note (Signed)
NAMEMarland Kitchen  [name], [name] NO.:  [id number]   MEDICAL RECORD NO.:  [id number]          PATIENT TYPE:  AMB   LOCATION:  DAY                          FACILITY:  [facility]   PHYSICIAN:  Jene Every, M.D.    DATE OF BIRTH:  Nov 22, 1940   DATE OF PROCEDURE:  10/04/2005  DATE OF DISCHARGE:                                 OPERATIVE REPORT   PREOPERATIVE DIAGNOSIS:  Rotator cuff tear and adhesive capsulitis, right  shoulder.   POSTOPERATIVE DIAGNOSIS:  Rotator cuff tear and adhesive capsulitis, right  shoulder.   OPERATION/PROCEDURE:  1. __________  manipulation under anesthesia.  2. Examination under anesthesia followed by open right rotator cuff      repair, subacromial with decompression.  3. Acromioplasty with utilized of  three Mitek suture anchors.   ANESTHESIA:  General.   SURGEON:  Jene Every, M.D.   ASSISTANT:  Roma Schanz, P.A.-C.   BRIEF INDICATION:  This is a 70 year old female with refractory shoulder  pain.  MRI indicated full-thickness retracted rotator cuff tear.  Operative  intervention was indicated for repair.  Risks and benefits discussed  including bleeding, infection, injury to vascular structures, suboptimal  range of motion, recurrent pain, tear, etc.   DESCRIPTION OF PROCEDURE:  The patient supine, beach chair position.  After  induction of adequate general anesthesia and 1 g of Kefzol, the right  shoulder was manipulated under anesthesia.  She lacked some forward flexion  which was achieved through gentle manipulation.  Good internal and external  rotation.  Good abduction.  Next, the right shoulder and upper extremities  prepped and draped in the usual sterile fashion.  Incision was made over the  anterior aspect of the acromion and Langer's lines. Subcutaneous tissue was  dissected by electrocautery to achieve hemostasis. The raphe between the  anterolateral heads of the deltoid was identified and divided and  subperiosteally  elevated from anterolateral aspect of the acromion.  High-  speed bur was utilized to perform the acromioplasty of the anterolateral  edge of the acromion. The CA ligament had been excised prior to that.  Hypertrophic bursa was excised.  A retractor and rotator cuff tear was noted  1 cm of retraction.  A trough was performed just medial to the greater  tuberosity lateral to the articular surfaces performed with a Beyer rongeur.  Three Mitek suture anchors spaced approximately 1 cm apart were impacted  into the bone. Excellent purchase.  No pullout.  The rotator cuff was then  advanced.  Digitally lysed adhesions in the subacromial space.  __________  was advanced into the bed and repaired with the Ethibond sutures that were  attached to the Mitek suture anchors with excellent repair.  The bleeding  edge of the tendon was then double-row sutured with 0 Vicryl that was  placed, trimmed, through the greater tuberosity in three separate sections  for a full watertight closure.  Good range without contraction.  Excellent  coverage.  I repaired the raphe with #1 Vicryl figure-of-eight suture,  subcutaneous tissue reapproximated with 2-0 Vicryl simple sutures and skin  was reapproximated with 4-0 subcuticular chromic.  Wound was reinforced with  Steri-Strips.  Sterile dressing applied.  The patient was placed supine on  the hospital bed, extubated without difficulty and transported to the  recovery room in satisfactory condition.  The patient tolerated the  procedure well.  No complications.      Jene Every, M.D.  Electronically Signed     JB/MEDQ  D:  10/04/2005  T:  10/06/2005  Job:  [id number]

## 2010-06-08 NOTE — Op Note (Signed)
[name], [name] NO.:  [id number]   MEDICAL RECORD NO.:  [id number]          PATIENT TYPE:  AMB   LOCATION:  DAY                          FACILITY:  [facility]   PHYSICIAN:  Jene Every, M.D.    DATE OF BIRTH:  [birth year]   DATE OF PROCEDURE:  03/21/2005  DATE OF DISCHARGE:                                 OPERATIVE REPORT   PROCEDURE:  1.  Lateral recess decompression in the 4-5 foraminotomy of L5.  2.  Microdiskectomy at 4-5.   ANESTHESIA:  General.   ASSISTANT:  Roma Schanz, P.A.   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 The patient had severe pain,  numbness in the L4-5 dermatome. EHL weakness, quad weakness. Operative  intervention was indicated for decompression. The 4 and 5 root by  microdiskectomy and lateral recess decompression. The risks and benefits  were discussed including bleeding, infection, injury to neurovascular  structures, CSF leakage, epidural fibrosis, adjacent segment disease, need  for fusion in the future, anesthetic complications, etc.   TECHNIQUE:  The patient in the supine position and after the induction of  adequate general anesthesia and 1 gram of Kefzol, she was placed prone on  the [place] frame, all bony prominences well padded. The lumbar region was  prepped and draped in the usual sterile fashion. Two 18 gauge spinal needles  were utilized to localize the 4-5 interspace confirmed with x-ray. An  incision was made from the spinous process of 4 to 5, subcutaneous tissue  was dissected, electrocautery was utilized to achieve hemostasis. The  dorsolumbar fascia was identified and divided in line with the skin  incision, paraspinous muscle elevated from the lamina of 4 and 5. A  McCullough retractor was placed, operating microscope was draped and brought  onto the surgical field. Confirmatory radiograph obtained with Penfield 4 at  the upper  end of the interlaminar space. Hemilaminotomy on the caudad edge  of 4 was performed with a high speed diamond bur and a 2 and 3 mm Kerrison  detaching the ligamentum flavum from the cephalad edge preserving the wide  section of the pars. The ligamentum flavum was detached from the cephalad  edge of 5 with a straight curette. A 2 mm Kerrison was utilized to perform a  foraminotomy at 5. I decompressed the lateral recess __________ after the  ligamentum flavum was removed from the interspace with the 5 root compressed  into the lateral recess. Recess was decompressed. The 5 root was stretched  proximally to the thecal sac. We identified the disk space, gently mobilized  the thecal sac medially, undercut the facet down the disk space and a free  fragment migrated cephalad into the foramen compressing the 4 root. I swept  the foramen cephalad and 4 separate fragments were removed that were out  into the foramen up underneath the 4 root. These were retrieved with a  micropituitary. These were sent to pathology. One piece was evacuated in the  suction. I entered the disk space, there was no disk herniation here noted  that was retrievable. I swept again with a hockey stick and a nerve hook out  in the foramen of 4 underneath the thecal sac. I examined the foramen of 4  and 5 and no residual disk herniation. There was a good excursion of the  root, at least a centimeter of medial pedicle without difficulty. Bipolar  electrocautery had been utilized to achieve hemostasis. The disk space was  copiously irrigated with antibiotic irrigation. Next, inspection revealed no  evidence of CSF leakage or active bleeding. Thrombin soaked Gelfoam was  placed in the laminotomy defect. A McCullough retractor was removed,  paraspinous muscle was inspected with no evidence of active bleeding. The  dorsolumbar fascia was reapproximated with #1 Vicryl interrupted figure-of-  eight suture. The subcutaneous tissue  reapproximated with 2-0 Vicryl simple  suture. The skin was reapproximated with 4-0 subcuticular Prolene, wound  reinforced with Steri-Strips, sterile dressing applied. Placed supine on the  hospital bed, extubated without difficulty and transported to the recovery  room in satisfactory condition.   The patient tolerated the procedure well with no complications. Minimal  blood loss. Assistant Roma Schanz.      Jene Every, M.D.  Electronically Signed     JB/MEDQ  D:  03/21/2005  T:  03/22/2005  Job:  [id number]

## 2010-06-08 NOTE — Discharge Summary (Signed)
[name], [name] NO.:  [id number]   MEDICAL RECORD NO.:  [id number]          PATIENT TYPE:  AMB   LOCATION:  DAY                          FACILITY:  [facility]   PHYSICIAN:  Jene Every, M.D.    DATE OF BIRTH:  [birth year]   DATE OF ADMISSION:  10/04/2005  DATE OF DISCHARGE:  10/04/2005                                 DISCHARGE SUMMARY   /   No dictation.      Jene Every, M.D.  Electronically Signed     JB/MEDQ  D:  10/04/2005  T:  10/06/2005  Job:  [id number]

## 2010-08-15 ENCOUNTER — Encounter: Payer: Self-pay | Admitting: Internal Medicine

## 2010-08-20 ENCOUNTER — Encounter: Payer: Self-pay | Admitting: Internal Medicine

## 2010-08-20 ENCOUNTER — Ambulatory Visit (INDEPENDENT_AMBULATORY_CARE_PROVIDER_SITE_OTHER): Payer: Self-pay | Admitting: Internal Medicine

## 2010-08-20 VITALS — BP 110/70 | HR 62 | Temp 97.7°F | Wt 164.0 lb

## 2010-08-20 DIAGNOSIS — R030 Elevated blood-pressure reading, without diagnosis of hypertension: Secondary | ICD-10-CM

## 2010-08-20 DIAGNOSIS — R002 Palpitations: Secondary | ICD-10-CM

## 2010-08-20 DIAGNOSIS — E785 Hyperlipidemia, unspecified: Secondary | ICD-10-CM

## 2010-08-20 NOTE — Progress Notes (Signed)
  Subjective:    Patient ID: [name], female    DOB: [birth year], 70 y.o.   MRN: [id number]  HPI  70 year old patient who is seen today for followup. She has a history of occasional palpitations. She  really describes episodes where her heart is beating faster than normal. EKG reviewed today and revealed a sinus bradycardia with no ectopy. She is doing quite well she exercise regularly; she did lose her husband earlier this spring. She was seen by her dentist earlier had noted to have initial high blood pressure readings. Blood pressure today is in the low-normal range. She remains on Crestor for dyslipidemia which she continues to tolerate well. Laboratory studies were done in January and were unremarkable except for a low vitamin D. She is not on vitamin D supplements. This was encouraged today.   Review of Systems  Constitutional: Negative.   HENT: Negative for hearing loss, congestion, sore throat, rhinorrhea, dental problem, sinus pressure and tinnitus.   Eyes: Negative for pain, discharge and visual disturbance.  Respiratory: Negative for cough and shortness of breath.   Cardiovascular: Negative for chest pain, palpitations and leg swelling.  Gastrointestinal: Negative for nausea, vomiting, abdominal pain, diarrhea, constipation, blood in stool and abdominal distention.  Genitourinary: Negative for dysuria, urgency, frequency, hematuria, flank pain, vaginal bleeding, vaginal discharge, difficulty urinating, vaginal pain and pelvic pain.  Musculoskeletal: Negative for joint swelling, arthralgias and gait problem.  Skin: Negative for rash.  Neurological: Negative for dizziness, syncope, speech difficulty, weakness, numbness and headaches.  Hematological: Negative for adenopathy.  Psychiatric/Behavioral: Negative for behavioral problems, dysphoric mood and agitation. The patient is not nervous/anxious.        Objective:   Physical Exam  Constitutional: She is oriented to person,  place, and time. She appears well-developed and well-nourished.  HENT:  Head: Normocephalic.  Right Ear: External ear normal.  Left Ear: External ear normal.  Mouth/Throat: Oropharynx is clear and moist.  Eyes: Conjunctivae and EOM are normal. Pupils are equal, round, and reactive to light.  Neck: Normal range of motion. Neck supple. No thyromegaly present.  Cardiovascular: Normal rate, regular rhythm, normal heart sounds and intact distal pulses.   Pulmonary/Chest: Effort normal and breath sounds normal.  Abdominal: Soft. Bowel sounds are normal. She exhibits no mass. There is no tenderness.  Musculoskeletal: Normal range of motion.  Lymphadenopathy:    She has no cervical adenopathy.  Neurological: She is alert and oriented to person, place, and time.  Skin: Skin is warm and dry. No rash noted.  Psychiatric: She has a normal mood and affect. Her behavior is normal.          Assessment & Plan:   Palpitations. Dyslipidemia Normotensive History colonic polyps  She is doing quite well clinically. We'll continue her present regimen. Daily compliance with the Crestor encouraged. We'll recheck in 6 months daily vitamin D supplements encouraged

## 2010-08-20 NOTE — Patient Instructions (Signed)
It is important that you exercise regularly, at least 20 minutes 3 to 4 times per week.  If you develop chest pain or shortness of breath seek  medical attention.  Take a calcium supplement, plus 800-1200 units of vitamin D  Return in 6 months for follow-up  

## 2010-12-19 ENCOUNTER — Telehealth: Payer: Self-pay | Admitting: Internal Medicine

## 2010-12-19 NOTE — Telephone Encounter (Signed)
Spoke with pt- no fever, no cough, just has "a little" sore throat, dr. Amador Cunas out of office, I dont think rapid strep indicated at this time , offered appt tomorrow - declined at this time. Instructed to garlge with warm water, take allergy OTC to see if that helps -may be post nasal drip/allergy sx. Will call if sx change and she feels she needs to be seen . KIK

## 2010-12-19 NOTE — Telephone Encounter (Signed)
Wants to come in today. May have strep. Please advise.

## 2011-01-07 ENCOUNTER — Ambulatory Visit (INDEPENDENT_AMBULATORY_CARE_PROVIDER_SITE_OTHER): Payer: BC Managed Care – PPO | Admitting: Internal Medicine

## 2011-01-07 DIAGNOSIS — Z0000 Encounter for general adult medical examination without abnormal findings: Secondary | ICD-10-CM

## 2011-01-07 DIAGNOSIS — Z23 Encounter for immunization: Secondary | ICD-10-CM

## 2011-01-08 ENCOUNTER — Ambulatory Visit: Payer: BC Managed Care – PPO | Admitting: Internal Medicine

## 2011-01-09 ENCOUNTER — Ambulatory Visit: Payer: BC Managed Care – PPO | Admitting: Internal Medicine

## 2011-02-26 ENCOUNTER — Encounter: Payer: Self-pay | Admitting: Internal Medicine

## 2011-02-26 ENCOUNTER — Ambulatory Visit (INDEPENDENT_AMBULATORY_CARE_PROVIDER_SITE_OTHER): Payer: BC Managed Care – PPO | Admitting: Internal Medicine

## 2011-02-26 DIAGNOSIS — Z0000 Encounter for general adult medical examination without abnormal findings: Secondary | ICD-10-CM

## 2011-02-26 DIAGNOSIS — R002 Palpitations: Secondary | ICD-10-CM

## 2011-02-26 DIAGNOSIS — Z86010 Personal history of colonic polyps: Secondary | ICD-10-CM

## 2011-02-26 DIAGNOSIS — E785 Hyperlipidemia, unspecified: Secondary | ICD-10-CM

## 2011-02-26 DIAGNOSIS — E559 Vitamin D deficiency, unspecified: Secondary | ICD-10-CM

## 2011-02-26 DIAGNOSIS — H6120 Impacted cerumen, unspecified ear: Secondary | ICD-10-CM

## 2011-02-26 LAB — COMPREHENSIVE METABOLIC PANEL
AST: 27 U/L (ref 0–37)
BUN: 17 mg/dL (ref 6–23)
CO2: 31 mEq/L (ref 19–32)
Chloride: 107 mEq/L (ref 96–112)
Sodium: 144 mEq/L (ref 135–145)
Total Bilirubin: 0.7 mg/dL (ref 0.3–1.2)

## 2011-02-26 LAB — LIPID PANEL
Cholesterol: 258 mg/dL — ABNORMAL HIGH (ref 0–200)
VLDL: 13.4 mg/dL (ref 0.0–40.0)

## 2011-02-26 MED ORDER — LORAZEPAM 0.5 MG PO TABS: 0.5000 mg | ORAL_TABLET | Freq: Every day | ORAL | Status: DC | PRN

## 2011-02-26 MED ORDER — ROSUVASTATIN CALCIUM 5 MG PO TABS: 5.0000 mg | ORAL_TABLET | Freq: Every day | ORAL | Status: DC

## 2011-02-26 NOTE — Patient Instructions (Signed)
Take a calcium supplement, plus 800-1200 units of vitamin D    It is important that you exercise regularly, at least 20 minutes 3 to 4 times per week.  If you develop chest pain or shortness of breath seek  medical attention.  Return in one year for follow-up   

## 2011-02-26 NOTE — Progress Notes (Signed)
Subjective:    Patient ID: [name], female    DOB: [birth year], 71 y.o.   MRN: [id number]  HPI History of Present Illness:   71  year old patient who is seen today for a preventive health examination. She is followed by OB/GYN at [facility]. She is a prior hysterectomy and has had a recent gynecologic exam. Last week. She has a history of occasional palpitations that have not been bothersome of late. She is on Crestor for dyslipidemia, which she discontinued approximately 4 months ago and also has a history colonic polyps. She is up-to-date on her colonoscopies. She otherwise is asymptomatic.    Here for Medicare AWV:   1. Risk factors based on Past M, S, F history: cardiovascular risk factors include dyslipidemia  2. Physical Activities: remains active without exercise limitation  3. Depression/mood: no history depression, or mood disorder, has had some mild anxiety issues in the past due to situational stressors, but stable at present  4. Hearing: no deficits  5. ADL's: independent in all aspects of daily living. Still works full-time  6. Fall Risk: low  7. Home Safety: no problems identifying  8. Height, weight, &visual acuity:height and weight stable. No change in visual acuity  9. Counseling: mammogram will be obtained next month follow-up colonoscopy when due encouraged  10. Labs ordered based on risk factors: laboratory studies will be reviewed, including lipid panel. A vitamin D level. Also be checked in view of her history of vitamin D deficiency  11. Referral Coordination- eye examination scheduled for later this month  12. Care Plan- heart healthy diet, calcium and vitamin D supplementation, and regular. Exercise, all encouraged. Modest weight loss encouraged  13. Cognitive Assessment- alert and oriented, with normal affect, handles all executive functioning- husband had advanced dementia   Allergies (verified):  No Known Drug Allergies   Past History:  Past Medical  History:  Reviewed history from 10/21/2008 and no changes required.  Hyperlipidemia  Colonic polyps, hx of  palpitations  Diverticulosis, colon   Past Surgical History:  Hysterectomy [facility])  Lumbar laminectomy  Rotator cuff repair  colonoscopy 12/09   Family History:  Reviewed history from 10/31/2006 and no changes required.  father recently died at 56. Mother died age 32. History of COPD, diabetes one brother, status post melanoma. One sister with diabetes s/p stent   Social History:  Reviewed history from 10/31/2006 and no changes required.  Widowed; husband died of  advanced dementia    Review of Systems  Constitutional: Negative for fever, appetite change, fatigue and unexpected weight change.  HENT: Negative for hearing loss, ear pain, nosebleeds, congestion, sore throat, mouth sores, trouble swallowing, neck stiffness, dental problem, voice change, sinus pressure and tinnitus.   Eyes: Negative for photophobia, pain, redness and visual disturbance.  Respiratory: Negative for cough, chest tightness and shortness of breath.   Cardiovascular: Negative for chest pain, palpitations and leg swelling.  Gastrointestinal: Negative for nausea, vomiting, abdominal pain, diarrhea, constipation, blood in stool, abdominal distention and rectal pain.  Genitourinary: Negative for dysuria, urgency, frequency, hematuria, flank pain, vaginal bleeding, vaginal discharge, difficulty urinating, genital sores, vaginal pain, menstrual problem and pelvic pain.  Musculoskeletal: Negative for back pain and arthralgias.  Skin: Negative for rash.  Neurological: Negative for dizziness, syncope, speech difficulty, weakness, light-headedness, numbness and headaches.  Hematological: Negative for adenopathy. Does not bruise/bleed easily.  Psychiatric/Behavioral: Negative for suicidal ideas, behavioral problems, self-injury, dysphoric mood and agitation. The patient is not nervous/anxious.  Objective:   Physical Exam  Constitutional: She is oriented to person, place, and time. She appears well-developed and well-nourished.  HENT:  Head: Normocephalic and atraumatic.  Right Ear: External ear normal.  Left Ear: External ear normal.  Mouth/Throat: Oropharynx is clear and moist.  Eyes: Conjunctivae and EOM are normal.  Neck: Normal range of motion. Neck supple. No JVD present. No thyromegaly present.  Cardiovascular: Normal rate, regular rhythm, normal heart sounds and intact distal pulses.   No murmur heard. Pulmonary/Chest: Effort normal and breath sounds normal. She has no wheezes. She has no rales.  Abdominal: Soft. Bowel sounds are normal. She exhibits no distension and no mass. There is no tenderness. There is no rebound and no guarding.  Musculoskeletal: Normal range of motion. She exhibits no edema and no tenderness.  Neurological: She is alert and oriented to person, place, and time. She has normal reflexes. No cranial nerve deficit. She exhibits normal muscle tone. Coordination normal.  Skin: Skin is warm and dry. No rash noted.  Psychiatric: She has a normal mood and affect. Her behavior is normal.          Assessment & Plan:    Preventive health examination Colonic  Polyps History of dyslipidemia. We'll review a lipid profile and consider resuming Crestor Vitamin D deficiency. We'll check a vitamin D level  Recheck one year

## 2011-02-27 LAB — LDL CHOLESTEROL, DIRECT: Direct LDL: 149.2 mg/dL

## 2011-02-27 NOTE — Progress Notes (Signed)
Quick Note:  Left a message for pt to return call. ______ 

## 2011-02-28 ENCOUNTER — Encounter: Payer: Self-pay | Admitting: Internal Medicine

## 2011-02-28 NOTE — Progress Notes (Signed)
Quick Note:  Spoke with pt - informed of labs - will start taking 2 ca/vit d 1200/1000 daily ______

## 2011-03-20 ENCOUNTER — Other Ambulatory Visit: Payer: Self-pay | Admitting: Dermatology

## 2011-08-30 LAB — HM MAMMOGRAPHY: HM Mammogram: NEGATIVE

## 2011-09-03 ENCOUNTER — Encounter: Payer: Self-pay | Admitting: Internal Medicine

## 2011-09-24 ENCOUNTER — Telehealth: Payer: Self-pay | Admitting: Family Medicine

## 2011-09-24 NOTE — Telephone Encounter (Signed)
[facility] Triage Call Report Triage Record Num: [id number] Operator: Tarri Glenn Patient Name: [name] Call Date & Time: 09/22/2011 12:36:38PM Patient Phone: [phone] PCP: Gordy Savers Patient Gender: Female PCP Fax : [phone] Patient DOB: [birth year] Practice Name: [facility] Reason for Call: Caller: [name]/Patient; PCP: Eleonore Chiquito [facility]); CB#: [phone]; Call regarding Nausea, shakiness and sweating; Patient is calling about sweating, nervous, shaky. Onset 09/22/11. Patient went to [facility] to have blood pressure checked. Blood pressure 152/76 Pulse 59. Patient denies chest pains. All emergent signs and symptoms ruled out with exception to new onset of generalized weakness per Weakness or Paralysis protocol. Advised patient to call family to come and get her to take to emergency room/urgent care for evaluation. Will call son to take her to urgent care. Protocol(s) Used: Weakness or Paralysis Recommended Outcome per Protocol: See ED Immediately Reason for Outcome: New onset of generalized weakness Care Advice: ~ Another adult should drive. 09/

## 2011-09-25 ENCOUNTER — Ambulatory Visit: Payer: Medicare Other | Admitting: Internal Medicine

## 2011-10-02 ENCOUNTER — Ambulatory Visit: Payer: Medicare Other | Admitting: Family

## 2011-10-03 ENCOUNTER — Ambulatory Visit (INDEPENDENT_AMBULATORY_CARE_PROVIDER_SITE_OTHER): Payer: Medicare Other | Admitting: Internal Medicine

## 2011-10-03 ENCOUNTER — Encounter: Payer: Self-pay | Admitting: Internal Medicine

## 2011-10-03 VITALS — BP 112/80 | Temp 98.3°F | Wt 164.0 lb

## 2011-10-03 DIAGNOSIS — E559 Vitamin D deficiency, unspecified: Secondary | ICD-10-CM

## 2011-10-03 DIAGNOSIS — H6120 Impacted cerumen, unspecified ear: Secondary | ICD-10-CM

## 2011-10-03 DIAGNOSIS — R42 Dizziness and giddiness: Secondary | ICD-10-CM

## 2011-10-03 DIAGNOSIS — Z23 Encounter for immunization: Secondary | ICD-10-CM

## 2011-10-03 DIAGNOSIS — E785 Hyperlipidemia, unspecified: Secondary | ICD-10-CM

## 2011-10-03 NOTE — Progress Notes (Signed)
Subjective:    Patient ID: [name], female    DOB: 11-04-1940, 71 y.o.   MRN: [id number]  HPI  71 year old patient who is seen today for followup. She has a number of complaints including nausea general sense of unwellness lightheadedness and decreased ability to focus properly. She describes herself as being anxious and shaky. Symptoms have been intermittent over the past 4 weeks. She has been exercising regularly and goes to her health club almost daily. She has a history of dyslipidemia but has self discontinued Crestor some time ago. She takes Ativan when necessary. She does have a history of prior positional vertigo but these symptoms are different. She has had cerumen impactions in the past and does complain of some ear fullness along with the dizziness and lightheadedness  Past Medical History  Diagnosis Date  . COLONIC POLYPS, HX OF 10/31/2006  . DIVERTICULOSIS, COLON 10/21/2008  . ELEVATED BP READING WITHOUT DX HYPERTENSION 07/12/2009  . HYPERLIPIDEMIA 07/01/2006  . PALPITATIONS, OCCASIONAL 10/21/2008  . VERTIGO 07/25/2009    History   Social History  . Marital Status: Married    Spouse Name: N/A    Number of Children: N/A  . Years of Education: N/A   Occupational History  . Not on file.   Social History Main Topics  . Smoking status: Never Smoker   . Smokeless tobacco: Never Used  . Alcohol Use: No  . Drug Use: No  . Sexually Active: Not on file   Other Topics Concern  . Not on file   Social History Narrative  . No narrative on file    Past Surgical History  Procedure Date  . Abdominal hysterectomy   . Laminectomy     lumbar  . Rotator cuff repair     Family History  Problem Relation Age of Onset  . Diabetes Sister   . Diabetes Brother     No Known Allergies  Current Outpatient Prescriptions on File Prior to Visit  Medication Sig Dispense Refill  . CALCIUM-VITAMIN D PO Take 2 tablets by mouth daily. Each tab - 1200-1000mg       . LORazepam  (ATIVAN) 0.5 MG tablet Take 1 tablet (0.5 mg total) by mouth daily as needed.  60 tablet  2  . Multiple Vitamin (MULTIVITAMIN) tablet Take 1 tablet by mouth daily.        BP 112/80  Temp 98.3 F (36.8 C) (Oral)  Wt 164 lb (74.39 kg)       Review of Systems  Constitutional: Positive for fatigue.  HENT: Negative for hearing loss, congestion, sore throat, rhinorrhea, dental problem, sinus pressure and tinnitus.   Eyes: Negative for pain, discharge and visual disturbance.  Respiratory: Negative for cough and shortness of breath.   Cardiovascular: Negative for chest pain, palpitations and leg swelling.  Gastrointestinal: Negative for nausea, vomiting, abdominal pain, diarrhea, constipation, blood in stool and abdominal distention.  Genitourinary: Negative for dysuria, urgency, frequency, hematuria, flank pain, vaginal bleeding, vaginal discharge, difficulty urinating, vaginal pain and pelvic pain.  Musculoskeletal: Negative for joint swelling, arthralgias and gait problem.  Skin: Negative for rash.  Neurological: Positive for dizziness and light-headedness. Negative for syncope, speech difficulty, weakness, numbness and headaches.  Hematological: Negative for adenopathy.  Psychiatric/Behavioral: Negative for behavioral problems, dysphoric mood and agitation. The patient is nervous/anxious.        Objective:   Physical Exam  Constitutional: She is oriented to person, place, and time. She appears well-developed and well-nourished.  HENT:  Head: Normocephalic.  Right Ear: External ear normal.  Left Ear: External ear normal.  Mouth/Throat: Oropharynx is clear and moist.       Bilateral cerumen impactions  Eyes: Conjunctivae normal and EOM are normal. Pupils are equal, round, and reactive to light.  Neck: Normal range of motion. Neck supple. No thyromegaly present.  Cardiovascular: Normal rate, regular rhythm, normal heart sounds and intact distal pulses.   Pulmonary/Chest: Effort  normal and breath sounds normal.  Abdominal: Soft. Bowel sounds are normal. She exhibits no mass. There is no tenderness.  Musculoskeletal: Normal range of motion.  Lymphadenopathy:    She has no cervical adenopathy.  Neurological: She is alert and oriented to person, place, and time.  Skin: Skin is warm and dry. No rash noted.  Psychiatric: She has a normal mood and affect. Her behavior is normal.          Assessment & Plan:  Bilateral cerumen impactions. Canals irrigated until clear Dyslipidemia. The patient wishes to hold the Crestor until her annual exam in February. We'll check a lipid profile at that time Anxiety disorder  Both ears irrigated. Hopefully her multiple symptoms will improve with this maneuver. Will call when necessary otherwise we'll see for annual exam in February and

## 2011-10-03 NOTE — Patient Instructions (Signed)
Call or return to clinic prn if these symptoms worsen or fail to improve as anticipated.  Annual exam in February as scheduled

## 2012-02-27 ENCOUNTER — Encounter: Payer: Medicare Other | Admitting: Internal Medicine

## 2012-02-28 ENCOUNTER — Encounter: Payer: Medicare Other | Admitting: Internal Medicine

## 2012-03-19 ENCOUNTER — Ambulatory Visit (INDEPENDENT_AMBULATORY_CARE_PROVIDER_SITE_OTHER): Payer: PRIVATE HEALTH INSURANCE | Admitting: Internal Medicine

## 2012-03-19 ENCOUNTER — Encounter: Payer: Self-pay | Admitting: Internal Medicine

## 2012-03-19 VITALS — BP 110/70 | HR 60 | Temp 97.0°F | Resp 18 | Ht 67.75 in | Wt 161.0 lb

## 2012-03-19 DIAGNOSIS — Z0000 Encounter for general adult medical examination without abnormal findings: Secondary | ICD-10-CM

## 2012-03-19 DIAGNOSIS — R030 Elevated blood-pressure reading, without diagnosis of hypertension: Secondary | ICD-10-CM

## 2012-03-19 DIAGNOSIS — Z860100 Personal history of colon polyps, unspecified: Secondary | ICD-10-CM

## 2012-03-19 DIAGNOSIS — R002 Palpitations: Secondary | ICD-10-CM

## 2012-03-19 DIAGNOSIS — E785 Hyperlipidemia, unspecified: Secondary | ICD-10-CM

## 2012-03-19 DIAGNOSIS — E559 Vitamin D deficiency, unspecified: Secondary | ICD-10-CM

## 2012-03-19 LAB — COMPREHENSIVE METABOLIC PANEL
Albumin: 3.9 g/dL (ref 3.5–5.2)
Alkaline Phosphatase: 58 U/L (ref 39–117)
BUN: 15 mg/dL (ref 6–23)
Calcium: 9.1 mg/dL (ref 8.4–10.5)
Chloride: 104 mEq/L (ref 96–112)
Glucose, Bld: 83 mg/dL (ref 70–99)
Potassium: 4.6 mEq/L (ref 3.5–5.1)

## 2012-03-19 LAB — CBC WITH DIFFERENTIAL/PLATELET
Basophils Relative: 0.8 % (ref 0.0–3.0)
Eosinophils Relative: 1.8 % (ref 0.0–5.0)
Lymphocytes Relative: 18.3 % (ref 12.0–46.0)
Monocytes Relative: 5.4 % (ref 3.0–12.0)
Neutrophils Relative %: 73.7 % (ref 43.0–77.0)
RBC: 4.23 Mil/uL (ref 3.87–5.11)
WBC: 5.6 10*3/uL (ref 4.5–10.5)

## 2012-03-19 LAB — LIPID PANEL
Total CHOL/HDL Ratio: 3
VLDL: 13.2 mg/dL (ref 0.0–40.0)

## 2012-03-19 LAB — TSH: TSH: 2.16 u[IU]/mL (ref 0.35–5.50)

## 2012-03-19 LAB — LDL CHOLESTEROL, DIRECT: Direct LDL: 149.6 mg/dL

## 2012-03-19 MED ORDER — LORAZEPAM 0.5 MG PO TABS: 0.5000 mg | ORAL_TABLET | Freq: Every day | ORAL | Status: DC | PRN

## 2012-03-19 NOTE — Progress Notes (Signed)
Patient ID: [name], female   DOB: 1940-04-29, 72 y.o.   MRN: [id number]  Subjective:    Patient ID: [name], female    DOB: 1940/06/06, 72 y.o.   MRN: [id number]  HPI History of Present Illness:   72   year old patient who is seen today for a preventive health examination. She is followed by OB/GYN at [facility] She is a prior hysterectomy and has had a recent gynecologic exam. She has a history of occasional palpitations that have not been bothersome of late. She has been  on Crestor for dyslipidemia, which she discontinued approximately 16 months ago and also has a history colonic polyps. She is up-to-date on her colonoscopies. She otherwise is asymptomatic.  Lipid profile reviewed from last year which revealed a quite elevated HDL cholesterol and a total cholesterol HDL ratio of 3.   Here for Medicare AWV:   1. Risk factors based on Past M, S, F history: cardiovascular risk factors include dyslipidemia (high HDL) 2. Physical Activities: remains active without exercise limitation; visits  her health club 5 or 6 times weekly 3. Depression/mood: no history depression, or mood disorder, has had some mild anxiety issues in the past due to situational stressors, but stable at present  4. Hearing: no deficits  5. ADL's: independent in all aspects of daily living. Still works full-time  6. Fall Risk: low  7. Home Safety: no problems identifying  8. Height, weight, &visual acuity:height and weight stable. No change in visual acuity  9. Counseling: mammogram will be obtained next month follow-up colonoscopy when due encouraged  10. Labs ordered based on risk factors: laboratory studies will be reviewed, including lipid panel. A vitamin D level. Also be checked in view of her history of vitamin D deficiency  11. Referral Coordination- eye examination scheduled for later this month  12. Care Plan- heart healthy diet, calcium and vitamin D supplementation, and regular. Exercise,  all encouraged. Modest weight loss encouraged  13. Cognitive Assessment- alert and oriented, with normal affect, handles all executive functioning- husband had advanced dementia   Allergies (verified):  No Known Drug Allergies   Past History:  Past Medical History:  Reviewed history from 10/21/2008 and no changes required.  Hyperlipidemia  Colonic polyps, hx of  palpitations  Diverticulosis, colon   Past Surgical History:  Hysterectomy [facility])  Lumbar laminectomy  Rotator cuff repair  colonoscopy 12/09   Family History:  Reviewed history from 10/31/2006 and no changes required.  father recently died at 58. Mother died age 1. History of COPD, diabetes one brother, status post melanoma. One sister with diabetes s/p stent   Social History:  Reviewed history from 10/31/2006 and no changes required.  Widowed; husband died of  advanced dementia    Review of Systems  Constitutional: Negative for fever, appetite change, fatigue and unexpected weight change.  HENT: Negative for hearing loss, ear pain, nosebleeds, congestion, sore throat, mouth sores, trouble swallowing, neck stiffness, dental problem, voice change, sinus pressure and tinnitus.   Eyes: Negative for photophobia, pain, redness and visual disturbance.  Respiratory: Negative for cough, chest tightness and shortness of breath.   Cardiovascular: Negative for chest pain, palpitations and leg swelling.  Gastrointestinal: Negative for nausea, vomiting, abdominal pain, diarrhea, constipation, blood in stool, abdominal distention and rectal pain.  Genitourinary: Negative for dysuria, urgency, frequency, hematuria, flank pain, vaginal bleeding, vaginal discharge, difficulty urinating, genital sores, vaginal pain, menstrual problem and pelvic pain.  Musculoskeletal: Negative for back pain  and arthralgias.  Skin: Negative for rash.  Neurological: Negative for dizziness, syncope, speech difficulty, weakness, light-headedness,  numbness and headaches.  Hematological: Negative for adenopathy. Does not bruise/bleed easily.  Psychiatric/Behavioral: Negative for suicidal ideas, behavioral problems, self-injury, dysphoric mood and agitation. The patient is not nervous/anxious.        Objective:   Physical Exam  Constitutional: She is oriented to person, place, and time. She appears well-developed and well-nourished.  HENT:  Head: Normocephalic and atraumatic.  Right Ear: External ear normal.  Left Ear: External ear normal.  Mouth/Throat: Oropharynx is clear and moist.  Eyes: Conjunctivae and EOM are normal.  Neck: Normal range of motion. Neck supple. No JVD present. No thyromegaly present.  Cardiovascular: Normal rate, regular rhythm, normal heart sounds and intact distal pulses.   No murmur heard. Pulmonary/Chest: Effort normal and breath sounds normal. She has no wheezes. She has no rales.  Abdominal: Soft. Bowel sounds are normal. She exhibits no distension and no mass. There is no tenderness. There is no rebound and no guarding.  Musculoskeletal: Normal range of motion. She exhibits no edema and no tenderness.  Neurological: She is alert and oriented to person, place, and time. She has normal reflexes. No cranial nerve deficit. She exhibits normal muscle tone. Coordination normal.  Skin: Skin is warm and dry. No rash noted.  Psychiatric: She has a normal mood and affect. Her behavior is normal.          Assessment & Plan:    Preventive health examination Colonic  Polyps will need followup colonoscopy in one year History of dyslipidemia. We'll review a lipid profile and consider resuming Crestor Vitamin D deficiency. We'll check a vitamin D level  Recheck one year

## 2012-03-19 NOTE — Patient Instructions (Signed)
It is important that you exercise regularly, at least 20 minutes 3 to 4 times per week.  If you develop chest pain or shortness of breath seek  medical attention.  Return in one year for follow-up   

## 2012-08-25 ENCOUNTER — Encounter: Payer: Self-pay | Admitting: Internal Medicine

## 2012-08-25 ENCOUNTER — Telehealth: Payer: Self-pay | Admitting: Internal Medicine

## 2012-08-25 NOTE — Telephone Encounter (Signed)
1.  Pt is traveling to [place] in September and wants to know if she needs any vacciantions.  2.. Pt thinks she is suipposed to have colonoscopy every 5 years due to the polyps that were found in 2008. Pt had a year fu colonoscopy in 2009. Pls advise.

## 2012-08-26 NOTE — Telephone Encounter (Signed)
Spoke to pt told her probably does not require any vaccinations to visit [place] but would check with her travel agent. Patient may schedule colonoscopy upon her return; suggest return office visit in the fall per Dr. Amador Cunas. Pt verbalized understanding and asked who she saw in GI. Told pt Yancey Flemings, can just call his office and schedule Colonoscopy. Pt verbalized understanding.

## 2012-08-26 NOTE — Telephone Encounter (Signed)
Probably does not require any vaccinations to visit [place] but would check with her travel agent. Patient may schedule colonoscopy upon her return;  suggest return office visit in the fall

## 2012-09-17 ENCOUNTER — Ambulatory Visit (INDEPENDENT_AMBULATORY_CARE_PROVIDER_SITE_OTHER): Payer: PRIVATE HEALTH INSURANCE | Admitting: Internal Medicine

## 2012-09-17 ENCOUNTER — Encounter: Payer: Self-pay | Admitting: Internal Medicine

## 2012-09-17 VITALS — BP 120/80 | HR 64 | Temp 98.0°F | Resp 20 | Wt 166.0 lb

## 2012-09-17 DIAGNOSIS — H6120 Impacted cerumen, unspecified ear: Secondary | ICD-10-CM

## 2012-09-17 DIAGNOSIS — H6123 Impacted cerumen, bilateral: Secondary | ICD-10-CM

## 2012-09-17 NOTE — Patient Instructions (Signed)
Return in 6 months for follow up

## 2012-09-17 NOTE — Progress Notes (Signed)
Subjective:    Patient ID: [name], female    DOB: [birth year], 72 y.o.   MRN: [id number]  HPI  72 year old patient who was evaluated by a home health agency and was noted have some cerumen in both canals. She does not have any symptoms.  Irrigation was attempted today which she found uncomfortable and later declined Otherwise done quite well. She is excited about a trip to [place] soon  Past Medical History  Diagnosis Date  . COLONIC POLYPS, HX OF 10/31/2006  . DIVERTICULOSIS, COLON 10/21/2008  . ELEVATED BP READING WITHOUT DX HYPERTENSION 07/12/2009  . HYPERLIPIDEMIA 07/01/2006  . PALPITATIONS, OCCASIONAL 10/21/2008  . VERTIGO 07/25/2009    History   Social History  . Marital Status: Married    Spouse Name: N/A    Number of Children: N/A  . Years of Education: N/A   Occupational History  . Not on file.   Social History Main Topics  . Smoking status: Never Smoker   . Smokeless tobacco: Never Used  . Alcohol Use: No  . Drug Use: No  . Sexual Activity: Not on file   Other Topics Concern  . Not on file   Social History Narrative  . No narrative on file    Past Surgical History  Procedure Laterality Date  . Abdominal hysterectomy    . Laminectomy      lumbar  . Rotator cuff repair      Family History  Problem Relation Age of Onset  . Diabetes Sister   . Diabetes Brother     No Known Allergies  Current Outpatient Prescriptions on File Prior to Visit  Medication Sig Dispense Refill  . CALCIUM-VITAMIN D PO Take 2 tablets by mouth daily. Each tab - 1200-1000mg       . LORazepam (ATIVAN) 0.5 MG tablet Take 1 tablet (0.5 mg total) by mouth daily as needed.  60 tablet  2  . Multiple Vitamin (MULTIVITAMIN) tablet Take 1 tablet by mouth daily.       No current facility-administered medications on file prior to visit.    BP 120/80  Pulse 64  Temp(Src) 98 F (36.7 C) (Oral)  Resp 20  Wt 166 lb (75.297 kg)  BMI 25.42 kg/m2  SpO2 98%       Review of  Systems  Constitutional: Negative.   HENT: Negative for hearing loss, congestion, sore throat, rhinorrhea, dental problem, sinus pressure and tinnitus.   Eyes: Negative for pain, discharge and visual disturbance.  Respiratory: Negative for cough and shortness of breath.   Cardiovascular: Negative for chest pain, palpitations and leg swelling.  Gastrointestinal: Negative for nausea, vomiting, abdominal pain, diarrhea, constipation, blood in stool and abdominal distention.  Genitourinary: Negative for dysuria, urgency, frequency, hematuria, flank pain, vaginal bleeding, vaginal discharge, difficulty urinating, vaginal pain and pelvic pain.  Musculoskeletal: Negative for joint swelling, arthralgias and gait problem.  Skin: Negative for rash.  Neurological: Negative for dizziness, syncope, speech difficulty, weakness, numbness and headaches.  Hematological: Negative for adenopathy.  Psychiatric/Behavioral: Negative for behavioral problems, dysphoric mood and agitation. The patient is not nervous/anxious.        Objective:   Physical Exam  Constitutional: She appears well-developed and well-nourished.  HENT:  Some cerumen in the right canal A few cerumen deposits on the left but nonobstructive          Assessment & Plan:   Dyslipidemia Patient has a modest amount of cerumen in the right canal but is asymptomatic. She does not wish  irrigation here in the office today. We'll try self home irrigation in the shower  Return in the spring for her annual physical

## 2012-10-26 ENCOUNTER — Encounter: Payer: Self-pay | Admitting: Internal Medicine

## 2012-12-24 ENCOUNTER — Ambulatory Visit (AMBULATORY_SURGERY_CENTER): Payer: Self-pay

## 2012-12-24 VITALS — Ht 67.0 in | Wt 164.0 lb

## 2012-12-24 DIAGNOSIS — Z860100 Personal history of colon polyps, unspecified: Secondary | ICD-10-CM

## 2012-12-24 MED ORDER — MOVIPREP 100 G PO SOLR: 1.0000 | Freq: Once | ORAL | Status: DC

## 2012-12-25 ENCOUNTER — Encounter: Payer: Self-pay | Admitting: Internal Medicine

## 2013-01-07 ENCOUNTER — Encounter: Payer: PRIVATE HEALTH INSURANCE | Admitting: Internal Medicine

## 2013-02-11 ENCOUNTER — Telehealth: Payer: Self-pay | Admitting: Internal Medicine

## 2013-02-11 NOTE — Telephone Encounter (Signed)
Called and left detailed message on her answering machine

## 2013-02-11 NOTE — Telephone Encounter (Signed)
Pt would like to talk to Dr.K concerning getting a colonoscopy that is scheduled in Feb. Pt states she does not want to be completely sedated and wants to know if the procedure is necessary.

## 2013-02-12 NOTE — Telephone Encounter (Signed)
called

## 2013-02-12 NOTE — Telephone Encounter (Signed)
Pt requesting dr Raliegh Ip to call her back has more questions regarding the deep sedation when having the colonoscopy.

## 2013-02-22 ENCOUNTER — Telehealth: Payer: Self-pay | Admitting: Internal Medicine

## 2013-02-22 NOTE — Telephone Encounter (Signed)
Patient explained that she gets "sick" after surgeries, she states she did fine after last colonoscopy and wants that sedation. Patient states that she does not want deep sedation. Patient states she spoke with "Dr.K" about this. Explained to patient that she will be able to speak with Dr.Perry before procedure about this request. She understands and will talk with Dr.Perry.

## 2013-02-24 ENCOUNTER — Encounter: Payer: Self-pay | Admitting: Internal Medicine

## 2013-02-24 ENCOUNTER — Ambulatory Visit (AMBULATORY_SURGERY_CENTER): Payer: Medicare Other | Admitting: Internal Medicine

## 2013-02-24 VITALS — BP 135/78 | HR 67 | Temp 97.2°F | Resp 16 | Ht 67.0 in | Wt 164.0 lb

## 2013-02-24 DIAGNOSIS — D126 Benign neoplasm of colon, unspecified: Secondary | ICD-10-CM

## 2013-02-24 DIAGNOSIS — Z860100 Personal history of colon polyps, unspecified: Secondary | ICD-10-CM

## 2013-02-24 HISTORY — PX: COLONOSCOPY: SHX174

## 2013-02-24 MED ORDER — SODIUM CHLORIDE 0.9 % IV SOLN
500.0000 mL | INTRAVENOUS | Status: DC
Start: 2013-02-24 — End: 2013-02-24

## 2013-02-24 NOTE — Progress Notes (Signed)
Called to room to assist during endoscopic procedure.  Patient ID and intended procedure confirmed with present staff. Received instructions for my participation in the procedure from the performing physician. ewm 

## 2013-02-24 NOTE — Progress Notes (Signed)
Report to pacu rn, vss, bbs=clear 

## 2013-02-24 NOTE — Op Note (Signed)
[place]  [employer] [address] [place], [address]   COLONOSCOPY PROCEDURE REPORT  PATIENT: [name], [name]  MR#: [id number] BIRTHDATE: Nov 11, 1940 , 51  yrs. old GENDER: Female ENDOSCOPIST: Eustace Quail, MD REFERRED [id number] Program Recall PROCEDURE DATE:  02/24/2013 PROCEDURE:   Colonoscopy with snare polypectomy x 1 First Screening Colonoscopy - Avg.  risk and is 50 yrs.  old or older - No.  Prior Negative Screening - Now for repeat screening. N/A  History of Adenoma - Now for follow-up colonoscopy & has been > or = to 3 yrs.  Yes hx of adenoma.  Has been 3 or more years since last colonoscopy.  Polyps Removed Today? Yes. ASA CLASS:   Class II INDICATIONS:Patient's personal history of adenomatous colon polyps. Index 04 ([place], hpp); f/u 12-2007 (small TA) MEDICATIONS: MAC sedation, administered by CRNA and propofol (Diprivan) 360mg  IV  DESCRIPTION OF PROCEDURE:   After the risks benefits and alternatives of the procedure were thoroughly explained, informed consent was obtained.  A digital rectal exam revealed no abnormalities of the rectum.   The LB [id number]  endoscope was introduced through the anus and advanced to the cecum, which was identified by both the appendix and ileocecal valve. No adverse events experienced.   The quality of the prep was excellent, using MoviPrep  The instrument was then slowly withdrawn as the colon was fully examined.  COLON FINDINGS: A sessile polyp measuring 6 mm in size was found at the cecum.  A polypectomy was performed with a cold snare.  The resection was complete and the polyp tissue was completely retrieved.   Moderate diverticulosis was noted The finding was in the left colon.   The colon mucosa was otherwise normal. Retroflexed views revealed internal hemorrhoids. The time to cecum=6 minutes 12 seconds.  Withdrawal time=11 minutes 18 seconds. The scope was withdrawn and the procedure  completed. COMPLICATIONS: There were no complications.  ENDOSCOPIC IMPRESSION: 1.   Sessile polyp measuring 6 mm in size was found at the cecum; polypectomy was performed with a cold snare 2.   Moderate diverticulosis was noted in the left colon 3.   The colon mucosa was otherwise normal  RECOMMENDATIONS: 1. Repeat colonoscopy in 5 years .   eSigned:  Eustace Quail, MD 02/24/2013 2:21 PM   cc: Marletta Lor, MD and The Patient

## 2013-02-24 NOTE — Patient Instructions (Signed)
Colon polyp x 1 removed today and diverticulosis seen. Handouts given. Repeat colonoscopy in 5 years. Resume current medications. Call us if you have any questions or concerns.Thank you!!  YOU HAD AN ENDOSCOPIC PROCEDURE TODAY AT [facility]: Refer to the procedure report that was given to you for any specific questions about what was found during the examination.  If the procedure report does not answer your questions, please call your gastroenterologist to clarify.  If you requested that your care partner not be given the details of your procedure findings, then the procedure report has been included in a sealed envelope for you to review at your convenience later.  YOU SHOULD EXPECT: Some feelings of bloating in the abdomen. Passage of more gas than usual.  Walking can help get rid of the air that was put into your GI tract during the procedure and reduce the bloating. If you had a lower endoscopy (such as a colonoscopy or flexible sigmoidoscopy) you may notice spotting of blood in your stool or on the toilet paper. If you underwent a bowel prep for your procedure, then you may not have a normal bowel movement for a few days.  DIET: Your first meal following the procedure should be a light meal and then it is ok to progress to your normal diet.  A half-sandwich or bowl of soup is an example of a good first meal.  Heavy or fried foods are harder to digest and may make you feel nauseous or bloated.  Likewise meals heavy in dairy and vegetables can cause extra gas to form and this can also increase the bloating.  Drink plenty of fluids but you should avoid alcoholic beverages for 24 hours.  ACTIVITY: Your care partner should take you home directly after the procedure.  You should plan to take it easy, moving slowly for the rest of the day.  You can resume normal activity the day after the procedure however you should NOT DRIVE or use heavy machinery for 24 hours (because of the sedation  medicines used during the test).    SYMPTOMS TO REPORT IMMEDIATELY: A gastroenterologist can be reached at any hour.  During normal business hours, 8:30 AM to 5:00 PM Monday through Friday, call [phone].  After hours and on weekends, please call the GI answering service at [phone] who will take a message and have the physician on call contact you.   Following lower endoscopy (colonoscopy or flexible sigmoidoscopy):  Excessive amounts of blood in the stool  Significant tenderness or worsening of abdominal pains  Swelling of the abdomen that is new, acute  Fever of 100F or higher  Following upper endoscopy (EGD)  Vomiting of blood or coffee ground material  New chest pain or pain under the shoulder blades  Painful or persistently difficult swallowing  New shortness of breath  Fever of 100F or higher  Black, tarry-looking stools  FOLLOW UP: If any biopsies were taken you will be contacted by phone or by letter within the next 1-3 weeks.  Call your gastroenterologist if you have not heard about the biopsies in 3 weeks.  Our staff will call the home number listed on your records the next business day following your procedure to check on you and address any questions or concerns that you may have at that time regarding the information given to you following your procedure. This is a courtesy call and so if there is no answer at the home number and we have  not heard from you through the emergency physician on call, we will assume that you have returned to your regular daily activities without incident.  SIGNATURES/CONFIDENTIALITY: You and/or your care partner have signed paperwork which will be entered into your electronic medical record.  These signatures attest to the fact that that the information above on your After Visit Summary has been reviewed and is understood.  Full responsibility of the confidentiality of this discharge information lies with you and/or your  care-partner.

## 2013-02-25 ENCOUNTER — Telehealth: Payer: Self-pay

## 2013-02-25 NOTE — Telephone Encounter (Signed)
  Follow up Call-  Call back number 02/24/2013  Post procedure Call Back phone  # cell [phone]  Permission to leave phone message Yes     Patient questions:  Do you have a fever, pain , or abdominal swelling? no Pain Score  0 *  Have you tolerated food without any problems? yes  Have you been able to return to your normal activities? yes  Do you have any questions about your discharge instructions: Diet   no Medications  no Follow up visit  no  Do you have questions or concerns about your Care? no  Actions: * If pain score is 4 or above: No action needed, pain <4.

## 2013-03-01 ENCOUNTER — Encounter: Payer: Self-pay | Admitting: Internal Medicine

## 2013-04-12 ENCOUNTER — Encounter: Payer: Self-pay | Admitting: Family

## 2013-04-12 ENCOUNTER — Ambulatory Visit (INDEPENDENT_AMBULATORY_CARE_PROVIDER_SITE_OTHER): Payer: Medicare Other | Admitting: Family

## 2013-04-12 VITALS — BP 138/88 | HR 77 | Ht 67.0 in | Wt 158.0 lb

## 2013-04-12 DIAGNOSIS — M67439 Ganglion, unspecified wrist: Secondary | ICD-10-CM

## 2013-04-12 DIAGNOSIS — M6740 Ganglion, unspecified site: Secondary | ICD-10-CM

## 2013-04-12 NOTE — Progress Notes (Signed)
Pre visit review using our clinic review tool, if applicable. No additional management support is needed unless otherwise documented below in the visit note. 

## 2013-04-12 NOTE — Progress Notes (Signed)
Subjective:    Patient ID: [name], female    DOB: Oct 19, 1940, 73 y.o.   MRN: [id number]  HPI 73 year old WF, nonsmoker, is in today with c/o 2 knots on the inside of her wrist x 1 month. Reports having pain 4/10, worse with weight lifting and doing push ups. Has not taken any medication for relief.    Review of Systems  Constitutional: Negative.   Respiratory: Negative.   Cardiovascular: Negative.   Genitourinary: Negative.   Musculoskeletal: Positive for arthralgias.       Right wrist pain with 2 knots   Skin: Negative.   Allergic/Immunologic: Negative.   Psychiatric/Behavioral: Negative.    Past Medical History  Diagnosis Date  . COLONIC POLYPS, HX OF 10/31/2006  . DIVERTICULOSIS, COLON 10/21/2008  . ELEVATED BP READING WITHOUT DX HYPERTENSION 07/12/2009  . HYPERLIPIDEMIA 07/01/2006  . PALPITATIONS, OCCASIONAL 10/21/2008  . VERTIGO 07/25/2009    History   Social History  . Marital Status: Married    Spouse Name: N/A    Number of Children: N/A  . Years of Education: N/A   Occupational History  . Not on file.   Social History Main Topics  . Smoking status: Never Smoker   . Smokeless tobacco: Never Used  . Alcohol Use: No  . Drug Use: No  . Sexual Activity: Not on file   Other Topics Concern  . Not on file   Social History Narrative  . No narrative on file    Past Surgical History  Procedure Laterality Date  . Abdominal hysterectomy    . Laminectomy      lumbar  . Rotator cuff repair      Family History  Problem Relation Age of Onset  . Diabetes Sister   . Diabetes Brother   . Colon cancer Neg Hx   . Pancreatic cancer Neg Hx   . Stomach cancer Neg Hx     No Known Allergies  Current Outpatient Prescriptions on File Prior to Visit  Medication Sig Dispense Refill  . CALCIUM-VITAMIN D PO Take 2 tablets by mouth daily. Each tab - 1200-1000mg       . desoximetasone (TOPICORT) 0.25 % cream Apply 1 application topically as needed.       Marland Kitchen  LORazepam (ATIVAN) 0.5 MG tablet Take 1 tablet (0.5 mg total) by mouth daily as needed.  60 tablet  2  . Multiple Vitamin (MULTIVITAMIN) tablet Take 1 tablet by mouth daily.       No current facility-administered medications on file prior to visit.    BP 138/88  Pulse 77  Ht 5\' 7"  (1.702 m)  Wt 158 lb (71.668 kg)  BMI 24.74 kg/m2chart    Objective:   Physical Exam  Constitutional: She is oriented to person, place, and time. She appears well-developed and well-nourished.  Neck: Normal range of motion. Neck supple.  Cardiovascular: Normal rate, regular rhythm and normal heart sounds.   Pulmonary/Chest: Effort normal and breath sounds normal.  Musculoskeletal: She exhibits tenderness.       Arms: Neurological: She is alert and oriented to person, place, and time.  Skin: Skin is warm and dry.  Psychiatric: She has a normal mood and affect.          Assessment & Plan:  [name] was seen today for no specified reason.  Diagnoses and associated orders for this visit:  Ganglion cyst of wrist - Ambulatory referral to Hand Surgery   Call the office with any questions or  concerns. Recheck as scheduled and as needed.

## 2013-04-12 NOTE — Patient Instructions (Signed)
Ganglion Cyst °A ganglion cyst is a noncancerous, fluid-filled lump that occurs near joints or tendons. The ganglion cyst grows out of a joint or the lining of a tendon. It most often develops in the hand or wrist but can also develop in the shoulder, elbow, hip, knee, ankle, or foot. The round or oval ganglion can be pea sized or larger than a grape. Increased activity may enlarge the size of the cyst because more fluid starts to build up.  °CAUSES  °It is not completely known what causes a ganglion cyst to grow. However, it may be related to: °· Inflammation or irritation around the joint. °· An injury. °· Repetitive movements or overuse. °· Arthritis. °SYMPTOMS  °A lump most often appears in the hand or wrist, but can occur in other areas of the body. Generally, the lump is painless without other symptoms. However, sometimes pain can be felt during activity or when pressure is applied to the lump. The lump may even be tender to the touch. Tingling, pain, numbness, or muscle weakness can occur if the ganglion cyst presses on a nerve. Your grip may be weak and you may have less movement in your joints.  °DIAGNOSIS  °Ganglion cysts are most often diagnosed based on a physical exam, noting where the cyst is and how it looks. Your caregiver will feel the lump and may shine a light alongside it. If it is a ganglion, a light often shines through it. Your caregiver may order an X-ray, ultrasound, or MRI to rule out other conditions. °TREATMENT  °Ganglions usually go away on their own without treatment. If pain or other symptoms are involved, treatment may be needed. Treatment is also needed if the ganglion limits your movement or if it gets infected. Treatment options include: °· Wearing a wrist or finger brace or splint. °· Taking anti-inflammatory medicine. °· Draining fluid from the lump with a needle (aspiration). °· Injecting a steroid into the joint. °· Surgery to remove the ganglion cyst and its stalk that is  attached to the joint or tendon. However, ganglion cysts can grow back. °HOME CARE INSTRUCTIONS  °· Do not press on the ganglion, poke it with a needle, or hit it with a heavy object. You may rub the lump gently and often. Sometimes fluid moves out of the cyst. °· Only take medicines as directed by your caregiver. °· Wear your brace or splint as directed by your caregiver. °SEEK MEDICAL CARE IF:  °· Your ganglion becomes larger or more painful. °· You have increased redness, red streaks, or swelling. °· You have pus coming from the lump. °· You have weakness or numbness in the affected area. °MAKE SURE YOU:  °· Understand these instructions. °· Will watch your condition. °· Will get help right away if you are not doing well or get worse. °Document Released: 01/05/2000 Document Revised: 10/02/2011 Document Reviewed: 03/03/2007 °ExitCare® Patient Information ©2014 [employer]. ° °

## 2013-04-13 ENCOUNTER — Telehealth: Payer: Self-pay

## 2013-04-13 NOTE — Telephone Encounter (Signed)
Pt called about referral. Pt states she was in pain all night and need to see the hand specialist asap

## 2013-04-16 ENCOUNTER — Other Ambulatory Visit (INDEPENDENT_AMBULATORY_CARE_PROVIDER_SITE_OTHER): Payer: Medicare Other

## 2013-04-16 DIAGNOSIS — E785 Hyperlipidemia, unspecified: Secondary | ICD-10-CM

## 2013-04-16 DIAGNOSIS — Z0000 Encounter for general adult medical examination without abnormal findings: Secondary | ICD-10-CM

## 2013-04-16 LAB — HEPATIC FUNCTION PANEL
ALK PHOS: 54 U/L (ref 39–117)
ALT: 21 U/L (ref 0–35)
AST: 19 U/L (ref 0–37)
Albumin: 3.7 g/dL (ref 3.5–5.2)
Bilirubin, Direct: 0.1 mg/dL (ref 0.0–0.3)
TOTAL PROTEIN: 6.8 g/dL (ref 6.0–8.3)
Total Bilirubin: 0.5 mg/dL (ref 0.3–1.2)

## 2013-04-16 LAB — POCT URINALYSIS DIPSTICK
Glucose, UA: NEGATIVE
Ketones, UA: NEGATIVE
Leukocytes, UA: NEGATIVE
NITRITE UA: NEGATIVE
PROTEIN UA: NEGATIVE
RBC UA: NEGATIVE
SPEC GRAV UA: 1.025
UROBILINOGEN UA: 0.2
pH, UA: 5.5

## 2013-04-16 LAB — BASIC METABOLIC PANEL
BUN: 18 mg/dL (ref 6–23)
CALCIUM: 8.9 mg/dL (ref 8.4–10.5)
CHLORIDE: 105 meq/L (ref 96–112)
CO2: 28 meq/L (ref 19–32)
Creatinine, Ser: 0.6 mg/dL (ref 0.4–1.2)
GFR: 106.77 mL/min (ref >60.00)
GLUCOSE: 74 mg/dL (ref 70–99)
POTASSIUM: 4.3 meq/L (ref 3.5–5.1)
Sodium: 141 mEq/L (ref 135–145)

## 2013-04-16 LAB — CBC WITH DIFFERENTIAL/PLATELET
BASOS PCT: 1.2 % (ref 0.0–3.0)
Basophils Absolute: 0 10*3/uL (ref 0.0–0.1)
EOS ABS: 0.1 10*3/uL (ref 0.0–0.7)
Eosinophils Relative: 3.8 % (ref 0.0–5.0)
HCT: 38.7 % (ref 36.0–46.0)
HEMOGLOBIN: 12.7 g/dL (ref 12.0–15.0)
LYMPHS ABS: 1.1 10*3/uL (ref 0.7–4.0)
LYMPHS PCT: 31.1 % (ref 12.0–46.0)
MCHC: 32.8 g/dL (ref 30.0–36.0)
MCV: 93.8 fl (ref 78.0–100.0)
Monocytes Absolute: 0.3 10*3/uL (ref 0.1–1.0)
Monocytes Relative: 9.6 % (ref 3.0–12.0)
NEUTROS ABS: 1.9 10*3/uL (ref 1.4–7.7)
Neutrophils Relative %: 54.3 % (ref 43.0–77.0)
Platelets: 294 10*3/uL (ref 150.0–400.0)
RBC: 4.13 Mil/uL (ref 3.87–5.11)
RDW: 14.6 % (ref 11.5–14.6)
WBC: 3.5 10*3/uL — ABNORMAL LOW (ref 4.5–10.5)

## 2013-04-16 LAB — LIPID PANEL
CHOL/HDL RATIO: 3
Cholesterol: 240 mg/dL — ABNORMAL HIGH (ref 0–200)
HDL: 87.2 mg/dL (ref >39.00)
LDL CALC: 145 mg/dL — AB (ref 0–99)
Triglycerides: 39 mg/dL (ref 0.0–149.0)
VLDL: 7.8 mg/dL (ref 0.0–40.0)

## 2013-04-16 LAB — TSH: TSH: 4.81 u[IU]/mL (ref 0.35–5.50)

## 2013-04-27 ENCOUNTER — Encounter: Payer: Self-pay | Admitting: Internal Medicine

## 2013-04-27 ENCOUNTER — Ambulatory Visit (INDEPENDENT_AMBULATORY_CARE_PROVIDER_SITE_OTHER): Payer: Medicare Other | Admitting: Internal Medicine

## 2013-04-27 VITALS — BP 110/70 | HR 80 | Temp 98.1°F | Resp 20 | Ht 68.0 in | Wt 161.0 lb

## 2013-04-27 DIAGNOSIS — Z23 Encounter for immunization: Secondary | ICD-10-CM

## 2013-04-27 DIAGNOSIS — R030 Elevated blood-pressure reading, without diagnosis of hypertension: Secondary | ICD-10-CM

## 2013-04-27 DIAGNOSIS — Z860100 Personal history of colon polyps, unspecified: Secondary | ICD-10-CM

## 2013-04-27 DIAGNOSIS — Z0000 Encounter for general adult medical examination without abnormal findings: Secondary | ICD-10-CM

## 2013-04-27 DIAGNOSIS — R002 Palpitations: Secondary | ICD-10-CM

## 2013-04-27 DIAGNOSIS — K5730 Diverticulosis of large intestine without perforation or abscess without bleeding: Secondary | ICD-10-CM

## 2013-04-27 DIAGNOSIS — E785 Hyperlipidemia, unspecified: Secondary | ICD-10-CM

## 2013-04-27 NOTE — Progress Notes (Signed)
Patient ID: [name], female   DOB: [birth year], 73 y.o.   MRN: [id number]  Subjective:    Patient ID: [name], female    DOB: 1940-06-11, 73 y.o.   MRN: [id number]  HPI  History of Present Illness:   73  year old patient who is seen today for a preventive health examination. She is followed by OB/GYN at [facility]. She is a prior hysterectomy and has had a recent gynecologic exam. She has a history of occasional palpitations that have not been bothersome of late. She has been  on Crestor for dyslipidemia, which she discontinued approximately years ago; she has a high HDL cholesterol, and lipid profile is not high risk;  also has a history colonic polyps. She is up-to-date on her colonoscopies. She otherwise is asymptomatic.  She has had a recent mammogram and colonoscopy earlier this year Lipid profile reviewed from last year which revealed a quite elevated HDL cholesterol and a total cholesterol HDL ratio of 3.   Here for Medicare AWV:   1. Risk factors based on Past M, S, F history: cardiovascular risk factors include dyslipidemia (high HDL) 2. Physical Activities: remains active without exercise limitation; visits  her health club 5 or 6 times weekly 3. Depression/mood: no history depression, or mood disorder, has had some mild anxiety issues in the past due to situational stressors, but stable at present  4. Hearing: no deficits  5. ADL's: independent in all aspects of daily living. Still works full-time  6. Fall Risk: low  7. Home Safety: no problems identifying  8. Height, weight, &visual acuity:height and weight stable. No change in visual acuity  9. Counseling: mammogram will be obtained next month follow-up colonoscopy when due encouraged  10. Labs ordered based on risk factors: laboratory studies will be reviewed, including lipid panel. A vitamin D level. Also be checked in view of her history of vitamin D deficiency  11. Referral Coordination- eye examination  scheduled for later this month  12. Care Plan- heart healthy diet, calcium and vitamin D supplementation, and regular. Exercise, all encouraged. Modest weight loss encouraged  13. Cognitive Assessment- alert and oriented, with normal affect, handles all executive functioning- husband had advanced dementia   Allergies (verified):  No Known Drug Allergies   Past History:  Past Medical History:   Hyperlipidemia  Colonic polyps, hx of  palpitations  Diverticulosis, colon   Past Surgical History:  Hysterectomy ([place])  Lumbar laminectomy  Rotator cuff repair  colonoscopy 12/09   Family History:    father recently died at 25. Mother died age 36. History of COPD, diabetes one brother, status post melanoma. One sister with diabetes s/p stent   Social History:    Widowed; husband died of  advanced dementia    Review of Systems  Constitutional: Negative for fever, appetite change, fatigue and unexpected weight change.  HENT: Negative for congestion, dental problem, ear pain, hearing loss, mouth sores, nosebleeds, sinus pressure, sore throat, tinnitus, trouble swallowing and voice change.   Eyes: Negative for photophobia, pain, redness and visual disturbance.  Respiratory: Negative for cough, chest tightness and shortness of breath.   Cardiovascular: Negative for chest pain, palpitations and leg swelling.  Gastrointestinal: Negative for nausea, vomiting, abdominal pain, diarrhea, constipation, blood in stool, abdominal distention and rectal pain.  Genitourinary: Negative for dysuria, urgency, frequency, hematuria, flank pain, vaginal bleeding, vaginal discharge, difficulty urinating, genital sores, vaginal pain, menstrual problem and pelvic pain.  Musculoskeletal: Negative for arthralgias, back pain  and neck stiffness.  Skin: Negative for rash.  Neurological: Negative for dizziness, syncope, speech difficulty, weakness, light-headedness, numbness and headaches.  Hematological:  Negative for adenopathy. Does not bruise/bleed easily.  Psychiatric/Behavioral: Negative for suicidal ideas, behavioral problems, self-injury, dysphoric mood and agitation. The patient is not nervous/anxious.        Objective:   Physical Exam  Constitutional: She is oriented to person, place, and time. She appears well-developed and well-nourished.  HENT:  Head: Normocephalic and atraumatic.  Right Ear: External ear normal.  Left Ear: External ear normal.  Mouth/Throat: Oropharynx is clear and moist.  Eyes: Conjunctivae and EOM are normal.  Neck: Normal range of motion. Neck supple. No JVD present. No thyromegaly present.  Cardiovascular: Normal rate, regular rhythm, normal heart sounds and intact distal pulses.   No murmur heard. Pulmonary/Chest: Effort normal and breath sounds normal. She has no wheezes. She has no rales.  Abdominal: Soft. Bowel sounds are normal. She exhibits no distension and no mass. There is no tenderness. There is no rebound and no guarding.  Musculoskeletal: Normal range of motion. She exhibits no edema and no tenderness.  Neurological: She is alert and oriented to person, place, and time. She has normal reflexes. No cranial nerve deficit. She exhibits normal muscle tone. Coordination normal.  Skin: Skin is warm and dry. No rash noted.  Psychiatric: She has a normal mood and affect. Her behavior is normal.          Assessment & Plan:    Preventive health examination Colonic  Polyps will need followup colonoscopy in 5 years History of dyslipidemia.  Vitamin D deficiency. We'll check a vitamin D level  Recheck one year

## 2013-04-27 NOTE — Patient Instructions (Signed)
Limit your sodium (Salt) intake    It is important that you exercise regularly, at least 20 minutes 3 to 4 times per week.  If you develop chest pain or shortness of breath seek  medical attention.  Take a calcium supplement, plus 800-1200 units of vitamin D  Return in one year for follow-up  

## 2013-04-27 NOTE — Progress Notes (Signed)
Pre-visit discussion using our clinic review tool. No additional management support is needed unless otherwise documented below in the visit note.  

## 2013-06-19 ENCOUNTER — Ambulatory Visit: Payer: 59

## 2013-06-19 ENCOUNTER — Ambulatory Visit (INDEPENDENT_AMBULATORY_CARE_PROVIDER_SITE_OTHER): Payer: 59 | Admitting: Emergency Medicine

## 2013-06-19 VITALS — BP 124/70 | HR 63 | Temp 98.0°F | Resp 16 | Ht 68.25 in | Wt 158.8 lb

## 2013-06-19 DIAGNOSIS — M25539 Pain in unspecified wrist: Secondary | ICD-10-CM

## 2013-06-19 DIAGNOSIS — M25532 Pain in left wrist: Secondary | ICD-10-CM

## 2013-06-19 DIAGNOSIS — L0291 Cutaneous abscess, unspecified: Secondary | ICD-10-CM

## 2013-06-19 DIAGNOSIS — L0390 Cellulitis, unspecified: Secondary | ICD-10-CM

## 2013-06-19 LAB — POCT CBC
Granulocyte percent: 75.6 %G (ref 37–80)
HEMATOCRIT: 40.5 % (ref 37.7–47.9)
Hemoglobin: 12.7 g/dL (ref 12.2–16.2)
Lymph, poc: 1.6 (ref 0.6–3.4)
MCH: 30 pg (ref 27–31.2)
MCHC: 31.4 g/dL — AB (ref 31.8–35.4)
MCV: 95.7 fL (ref 80–97)
MID (CBC): 0.5 (ref 0–0.9)
MPV: 9.3 fL (ref 0–99.8)
PLATELET COUNT, POC: 284 10*3/uL (ref 142–424)
POC Granulocyte: 6.6 (ref 2–6.9)
POC LYMPH PERCENT: 18.6 %L (ref 10–50)
POC MID %: 5.8 %M (ref 0–12)
RBC: 4.23 M/uL (ref 4.04–5.48)
RDW, POC: 15.5 %
WBC: 8.7 10*3/uL (ref 4.6–10.2)

## 2013-06-19 MED ORDER — CEFTRIAXONE SODIUM 1 G IJ SOLR
1.0000 g | Freq: Once | INTRAMUSCULAR | Status: AC
  Administered 2013-06-19: 1 g via INTRAMUSCULAR

## 2013-06-19 MED ORDER — CLINDAMYCIN HCL 300 MG PO CAPS
300.0000 mg | ORAL_CAPSULE | Freq: Three times a day (TID) | ORAL | Status: DC
Start: 2013-06-19 — End: 2014-08-11

## 2013-06-19 NOTE — Progress Notes (Addendum)
Subjective:    Patient ID: [name], female    DOB: 09/05/1940, 73 y.o.   MRN: [id number]  Arm Pain    Chief Complaint  Patient presents with  . Arm Pain    right arm red and swollen and painful   This chart was scribed for Margaret Queen, MD by Thea Alken, ED Scribe. This patient was seen in room 13 and the patient's care was started at 2:48 PM.  HPI Comments: [name] is a 73 y.o. female who presents to the [facility] complaining of worsening left wrist/arm pain onset 2 days. She denies any known injuries to left arm/wrist. She reports symptoms started 2 days ago when the pain woke her in the middle of the night. She she reports lifting weights that same day. She reports pain and swelling has gradually worsened and the she now has pain and numbness to he fingers. She reports swelling began 1 day ago in the morning. Pt reports has taken motrin every 4 hours since the pain began.  Pt denies fever.   Past Medical History  Diagnosis Date  . COLONIC POLYPS, HX OF 10/31/2006  . DIVERTICULOSIS, COLON 10/21/2008  . ELEVATED BP READING WITHOUT DX HYPERTENSION 07/12/2009  . HYPERLIPIDEMIA 07/01/2006  . PALPITATIONS, OCCASIONAL 10/21/2008  . VERTIGO 07/25/2009   No Known Allergies Prior to Admission medications   Medication Sig Start Date End Date Taking? Authorizing Provider  LORazepam (ATIVAN) 0.5 MG tablet Take 0.5 mg by mouth every 8 (eight) hours as needed for anxiety.   Yes Historical Provider, MD  desoximetasone (TOPICORT) 0.25 % cream Apply 1 application topically as needed.  07/02/12   Historical Provider, MD   Review of Systems  Constitutional: Negative for fever and chills.  Musculoskeletal: Positive for myalgias.      Objective:   Physical Exam CONSTITUTIONAL: Well developed/well nourished HEAD: Normocephalic/atraumatic EYES: EOMI/PERRL ENMT: Mucous membranes moist NECK: supple no meningeal signs SPINE:entire spine nontender CV: S1/S2  noted, no murmurs/rubs/gallops noted LUNGS: Lungs are clear to auscultation bilaterally, no apparent distress ABDOMEN: soft, nontender, no rebound or guarding GU:no cva tenderness NEURO: Pt is awake/alert, moves all extremitiesx4 EXTREMITIES: pulses normal, full ROM, redness with swelling to distal half of forearm, no lymph nodes to left axilla SKIN: warm, color normal PSYCH: no abnormalities of mood noted Results for orders placed in visit on 06/19/13  POCT CBC      Result Value Ref Range   WBC 8.7  4.6 - 10.2 K/uL   Lymph, poc 1.6  0.6 - 3.4   POC LYMPH PERCENT 18.6  10 - 50 %L   MID (cbc) 0.5  0 - 0.9   POC MID % 5.8  0 - 12 %M   POC Granulocyte 6.6  2 - 6.9   Granulocyte percent 75.6  37 - 80 %G   RBC 4.23  4.04 - 5.48 M/uL   Hemoglobin 12.7  12.2 - 16.2 g/dL   HCT, POC 40.5  37.7 - 47.9 %   MCV 95.7  80 - 97 fL   MCH, POC 30.0  27 - 31.2 pg   MCHC 31.4 (*) 31.8 - 35.4 g/dL   RDW, POC 15.5     Platelet Count, POC 284  142 - 424 K/uL   MPV 9.3  0 - 99.8 fL  [facility] reading (PRIMARY) by  Dr.Braylyn Kalter there are 2 radiolucent areas in the lunate. Please comment regarding whether this could be Keinbock's disease.  Assessment & Plan:   1. Left wrist pain   2. Cellulitis    x-ray sent to the radiologist. Burnis Medin give her 1 g of Rocephin and placed on Cleocin recheck in 24 hours this may be a tendinitis in that the patient has been working out at the gym on a regular basis with weights. Also would worry she could pick up some type of bacteria off of the weight lifting tables and equipment. Recheck in 24 hours. She does have cystic changes of the lunate bone. The radiologist did not feel this was secondary to osteonecrosis I personally performed the services described in this documentation, which was scribed in my presence. The recorded information has been reviewed and is accurate.

## 2013-06-19 NOTE — Patient Instructions (Signed)
Cellulitis Cellulitis is an infection of the skin and the tissue beneath it. The infected area is usually red and tender. Cellulitis occurs most often in the arms and lower legs.  CAUSES  Cellulitis is caused by bacteria that enter the skin through cracks or cuts in the skin. The most common types of bacteria that cause cellulitis are Staphylococcus and Streptococcus. SYMPTOMS   Redness and warmth.  Swelling.  Tenderness or pain.  Fever. DIAGNOSIS  Your caregiver can usually determine what is wrong based on a physical exam. Blood tests may also be done. TREATMENT  Treatment usually involves taking an antibiotic medicine. HOME CARE INSTRUCTIONS   Take your antibiotics as directed. Finish them even if you start to feel better.  Keep the infected arm or leg elevated to reduce swelling.  Apply a warm cloth to the affected area up to 4 times per day to relieve pain.  Only take over-the-counter or prescription medicines for pain, discomfort, or fever as directed by your caregiver.  Keep all follow-up appointments as directed by your caregiver. SEEK MEDICAL CARE IF:   You notice red streaks coming from the infected area.  Your red area gets larger or turns dark in color.  Your bone or joint underneath the infected area becomes painful after the skin has healed.  Your infection returns in the same area or another area.  You notice a swollen bump in the infected area.  You develop new symptoms. SEEK IMMEDIATE MEDICAL CARE IF:   You have a fever.  You feel very sleepy.  You develop vomiting or diarrhea.  You have a general ill feeling (malaise) with muscle aches and pains. MAKE SURE YOU:   Understand these instructions.  Will watch your condition.  Will get help right away if you are not doing well or get worse. Document Released: 10/17/2004 Document Revised: 07/09/2011 Document Reviewed: 03/25/2011 [employer] Patient Information 2014 [employer].  

## 2013-06-20 ENCOUNTER — Ambulatory Visit (INDEPENDENT_AMBULATORY_CARE_PROVIDER_SITE_OTHER): Payer: 59 | Admitting: Family Medicine

## 2013-06-20 VITALS — BP 118/68 | HR 72 | Temp 98.0°F | Ht 67.5 in | Wt 160.6 lb

## 2013-06-20 DIAGNOSIS — L0291 Cutaneous abscess, unspecified: Secondary | ICD-10-CM

## 2013-06-20 DIAGNOSIS — L0390 Cellulitis, unspecified: Secondary | ICD-10-CM

## 2013-06-20 LAB — URIC ACID: Uric Acid, Serum: 2.9 mg/dL (ref 2.4–7.0)

## 2013-06-20 NOTE — Progress Notes (Signed)
° °  Subjective:    Patient ID: [name], female    DOB: 04-29-1940, 73 y.o.   MRN: [id number]  HPI Chief Complaint  Patient presents with   wound recheck    This chart was scribed for [name], MD by Thea Alken, ED Scribe. This patient was seen in room 13 and the patient's care was started at 1:53 PM.  HPI Comments: [name] is a 73 y.o. female who presents to the [facility] wound recheck. Pt was seen 1 day ago by Dr. Everlene Farrier and was prescribed Cleocin and a 24 hour discharge. Today pt reports she started her dose this morning at 8am and has recently taken her 2 o'clock dose. She denies left arm being painful or itchy but reports arm is warm to touch. She reports swelling has went down since 4 days ago but reports arm is still red. Pt reports she may have retrieved this infection while at the gym. Pt denies arm keeping her awake at night.    Past Medical History  Diagnosis Date   COLONIC POLYPS, HX OF 10/31/2006   DIVERTICULOSIS, COLON 10/21/2008   ELEVATED BP READING WITHOUT DX HYPERTENSION 07/12/2009   HYPERLIPIDEMIA 07/01/2006   PALPITATIONS, OCCASIONAL 10/21/2008   VERTIGO 07/25/2009   No Known Allergies Prior to Admission medications   Medication Sig Start Date End Date Taking? Authorizing Provider  clindamycin (CLEOCIN) 300 MG capsule Take 1 capsule (300 mg total) by mouth 3 (three) times daily. 06/19/13  Yes Darlyne Russian, MD  desoximetasone (TOPICORT) 0.25 % cream Apply 1 application topically as needed.  07/02/12  Yes Historical Provider, MD   Review of Systems  Skin: Negative for rash and wound.       Objective:   Physical Exam  Nursing note and vitals reviewed. Constitutional: She is oriented to person, place, and time. She appears well-developed and well-nourished. No distress.  HENT:  Head: Normocephalic and atraumatic.  Eyes: EOM are normal.  Neck: Neck supple. No tracheal deviation present.  Cardiovascular: Normal  rate.   Pulmonary/Chest: Effort normal. No respiratory distress.  Musculoskeletal: Normal range of motion.  Confluent erythematous changes with underlying swelling to left volar forearm which is the approximate same area as yesterday as seen with these pen mark seen at the periphery  Full ROM .5cm soft tissue subcutaneous of the distal forearm  Neurological: She is alert and oriented to person, place, and time.  Skin: Skin is warm and dry.  Psychiatric: She has a normal mood and affect. Her behavior is normal.  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

## 2013-06-23 ENCOUNTER — Ambulatory Visit (INDEPENDENT_AMBULATORY_CARE_PROVIDER_SITE_OTHER): Payer: 59 | Admitting: Family Medicine

## 2013-06-23 VITALS — BP 132/80 | HR 64 | Temp 97.2°F | Resp 20 | Ht 68.0 in | Wt 159.8 lb

## 2013-06-23 DIAGNOSIS — L0291 Cutaneous abscess, unspecified: Secondary | ICD-10-CM

## 2013-06-23 DIAGNOSIS — L0390 Cellulitis, unspecified: Secondary | ICD-10-CM

## 2013-06-23 NOTE — Progress Notes (Signed)
Is a 73 year old woman who comes in for followup of a cellulitis on her left wrist. She now remembers that she had a small cut prior to the development of this cellulitis.  Over the last couple days she seen steady improvement on the Cleocin. The pain and swelling are largely gone and the redness is stated to the point that it's almost gone.  Objective: No acute distress Patient has full range of motion The area of erythema originally noted this faded and there is almost no swelling.  Patient does seem to have a small ganglion cyst in the volar left wrist which is nontender.  Assessment: Resolving cellulitis without side effects from the Cleocin  Plan: Followup when necessary, finish antibiotics  Signed, Carola Frost.D.

## 2013-10-07 ENCOUNTER — Ambulatory Visit: Payer: Medicare Other | Admitting: Internal Medicine

## 2013-10-08 ENCOUNTER — Ambulatory Visit: Payer: Medicare Other | Admitting: Internal Medicine

## 2014-06-02 ENCOUNTER — Encounter: Payer: Self-pay | Admitting: Internal Medicine

## 2014-06-15 ENCOUNTER — Telehealth: Payer: Self-pay | Admitting: Family Medicine

## 2014-06-15 NOTE — Telephone Encounter (Signed)
Spoke to the pt.  She has not had her yearly mammogram.  Stated she will contact her friend because they go together and will make an appt when convenient.  Information updated in the system.

## 2014-08-11 ENCOUNTER — Ambulatory Visit (INDEPENDENT_AMBULATORY_CARE_PROVIDER_SITE_OTHER): Payer: Medicare Other | Admitting: Family Medicine

## 2014-08-11 ENCOUNTER — Ambulatory Visit (INDEPENDENT_AMBULATORY_CARE_PROVIDER_SITE_OTHER): Payer: Medicare Other

## 2014-08-11 VITALS — BP 160/90 | HR 75 | Temp 98.6°F | Resp 16 | Ht 68.0 in | Wt 166.0 lb

## 2014-08-11 DIAGNOSIS — S60011A Contusion of right thumb without damage to nail, initial encounter: Secondary | ICD-10-CM

## 2014-08-11 LAB — POCT CBC
GRANULOCYTE PERCENT: 64.1 % (ref 37–80)
HCT, POC: 40.9 % (ref 37.7–47.9)
Hemoglobin: 13.4 g/dL (ref 12.2–16.2)
Lymph, poc: 1.9 (ref 0.6–3.4)
MCH, POC: 30.2 pg (ref 27–31.2)
MCHC: 32.8 g/dL (ref 31.8–35.4)
MCV: 92.1 fL (ref 80–97)
MID (cbc): 0.4 (ref 0–0.9)
MPV: 7.8 fL (ref 0–99.8)
POC Granulocyte: 4 (ref 2–6.9)
POC LYMPH %: 30 % (ref 10–50)
POC MID %: 5.9 %M (ref 0–12)
Platelet Count, POC: 290 10*3/uL (ref 142–424)
RBC: 4.44 M/uL (ref 4.04–5.48)
RDW, POC: 16.4 %
WBC: 6.2 10*3/uL (ref 4.6–10.2)

## 2014-08-11 NOTE — Progress Notes (Addendum)
Subjective:    Patient ID: [name], female    DOB: Feb 08, 1940, 74 y.o.   MRN: [id number] This chart was scribed for Margaret Forts, MD by Zola Button, Medical Scribe. This patient was seen in Room 14 and the patient's care was started at 7:12 PM.   08/11/2014  thumb pain   HPI  HPI Comments: [name] is a 74 y.o. female who presents to the [facility] complaining of ecchymoses to her right thumb that first began 15 days ago. Patient also reports a slight numbness sensation and slight swelling to her thumb. At that time, patient states she had just driven back from [facility] when she noticed some itchiness to her thumb. Her thumb began to turn purple shortly afterwards. This cleared after 2-3 days, but she noticed ecchymoses to her thumb again 1 week ago. She notes that the color has improved some since last night. Patient denies fever, chills, diaphoresis, arm pain, and other ecchymoses. She does not take any medications. She denies any injuries. However, she does note recently starting golf lessons. She had a lesson 3 weeks ago and another one 2 weeks ago, the day after her initial ecchymoses began. At the second lesson, she notes that pressing on the golf club with her thumb aggravated her thumb and caused her pain. Patient notes having a cyst to her right wrist area about 2 years ago.  No swelling to hand or forearm.  Localized swelling only to fatpad of R thumb.  No other sites of bruising or bleeding.  Feels well; denies fever/chills/sweats.  Denies aspirin therapy or other blood thinners.   Review of Systems  Constitutional: Negative for fever, chills, diaphoresis and fatigue.  Musculoskeletal: Positive for joint swelling. Negative for myalgias and arthralgias.  Skin: Positive for color change. Negative for pallor, rash and wound.  Neurological: Positive for numbness.  Hematological: Negative for adenopathy. Does not bruise/bleed easily.    Past  Medical History  Diagnosis Date  . COLONIC POLYPS, HX OF 10/31/2006  . DIVERTICULOSIS, COLON 10/21/2008  . ELEVATED BP READING WITHOUT DX HYPERTENSION 07/12/2009  . HYPERLIPIDEMIA 07/01/2006  . PALPITATIONS, OCCASIONAL 10/21/2008  . VERTIGO 07/25/2009   Past Surgical History  Procedure Laterality Date  . Abdominal hysterectomy    . Laminectomy      lumbar  . Rotator cuff repair     No Known Allergies Current Outpatient Prescriptions  Medication Sig Dispense Refill  . desoximetasone (TOPICORT) 0.25 % cream Apply 1 application topically as needed.      No current facility-administered medications for this visit.   History   Social History  . Marital Status: Married    Spouse Name: N/A  . Number of Children: N/A  . Years of Education: N/A   Occupational History  . Not on file.   Social History Main Topics  . Smoking status: Never Smoker   . Smokeless tobacco: Never Used  . Alcohol Use: Yes     Comment: wine occ  . Drug Use: No  . Sexual Activity: Not on file   Other Topics Concern  . Not on file   Social History Narrative       Objective:    BP 118/70 mmHg  Pulse 75  Temp(Src) 98.6 F (37 C) (Oral)  Resp 16  Ht 5\' 8"  (1.727 m)  Wt 166 lb (75.297 kg)  BMI 25.25 kg/m2  SpO2 98% Physical Exam  Constitutional: She is oriented to person, place, and time.  She appears well-developed and well-nourished. No distress.  HENT:  Head: Normocephalic and atraumatic.  Mouth/Throat: Oropharynx is clear and moist. No oropharyngeal exudate.  Eyes: Pupils are equal, round, and reactive to light.  Neck: Neck supple.  Cardiovascular: Normal rate, regular rhythm, normal heart sounds and intact distal pulses.  Exam reveals no gallop and no friction rub.   No murmur heard. Pulses:      Radial pulses are 2+ on the right side, and 2+ on the left side.  Good, strong radial pulses.  Pulmonary/Chest: Effort normal and breath sounds normal. No respiratory distress. She has no wheezes.  She has no rales.  Musculoskeletal: She exhibits no edema.  Full extension of IP joint of R thumb; no pain with ROM; full extension and flexion of thumb.  Mild swelling along fat pad of thumb.  Neurological: She is alert and oriented to person, place, and time. No cranial nerve deficit.  Skin: Skin is warm and dry. No rash noted. She is not diaphoretic. No erythema. No pallor.  +Ecchymoses along the distal fat pad of right thumb; +ecchymoses at base of nail bed on R thumb.   Psychiatric: She has a normal mood and affect. Her behavior is normal.  Vitals reviewed.  Results for orders placed or performed in visit on 08/11/14  POCT CBC  Result Value Ref Range   WBC 6.2 4.6 - 10.2 K/uL   Lymph, poc 1.9 0.6 - 3.4   POC LYMPH PERCENT 30.0 10 - 50 %L   MID (cbc) 0.4 0 - 0.9   POC MID % 5.9 0 - 12 %M   POC Granulocyte 4.0 2 - 6.9   Granulocyte percent 64.1 37 - 80 %G   RBC 4.44 4.04 - 5.48 M/uL   Hemoglobin 13.4 12.2 - 16.2 g/dL   HCT, POC 40.9 37.7 - 47.9 %   MCV 92.1 80 - 97 fL   MCH, POC 30.2 27 - 31.2 pg   MCHC 32.8 31.8 - 35.4 g/dL   RDW, POC 16.4 %   Platelet Count, POC 290 142 - 424 K/uL   MPV 7.8 0 - 99.8 fL   [facility] reading (PRIMARY) by  Dr. Tamala Julian. R THUMB:  DEGENERATIVE CHANGES; ? FRACTURE AT DEGENERATIVE AREA AT DIP?  ---OVER-READ NEGATIVE.      Assessment & Plan:   1. Superficial bruising of thumb, right, initial encounter    -New. -Benign exam; full range of motion without limitation or pain. -Normal platelets; normal CBC; no other sites of bruising; no evidence of petechiae or ecchymoses elsewhere. -Suspect trauma to thumb from recent golf lessons. -Recommend supportive care with elevation, icing, rest.  Avoid golfing for two weeks.  If recurrent or persists, consider further work up. -Equal upper extremity blood pressures.  No evidence of ecchymoses or cyanosis involving the feet; no suggestion of vascular process at this time yet if recurs will warrant vascular work  up.  No orders of the defined types were placed in this encounter.    No Follow-up on file.    I personally performed the services described in this documentation, which was scribed in my presence. The recorded information has been reviewed and considered.  Kristi Elayne Guerin, M.D. [facility] [address] [place], [place]  [address] [phone] phone [phone] fax

## 2014-08-11 NOTE — Patient Instructions (Signed)
1. Elevate hand as much as possible. 2.  Ice thumb twice daily for 15 minutes. 3.  If bruising recurs, recommend further evaluation.  Please return to office or follow-up with primary care physician.

## 2014-09-22 ENCOUNTER — Other Ambulatory Visit (INDEPENDENT_AMBULATORY_CARE_PROVIDER_SITE_OTHER): Payer: Medicare Other

## 2014-09-22 DIAGNOSIS — E785 Hyperlipidemia, unspecified: Secondary | ICD-10-CM | POA: Diagnosis not present

## 2014-09-22 DIAGNOSIS — Z0000 Encounter for general adult medical examination without abnormal findings: Secondary | ICD-10-CM

## 2014-09-22 LAB — CBC WITH DIFFERENTIAL/PLATELET
BASOS PCT: 0.7 % (ref 0.0–3.0)
Basophils Absolute: 0 10*3/uL (ref 0.0–0.1)
EOS ABS: 0.1 10*3/uL (ref 0.0–0.7)
Eosinophils Relative: 3.7 % (ref 0.0–5.0)
HEMATOCRIT: 41.3 % (ref 36.0–46.0)
Hemoglobin: 13.8 g/dL (ref 12.0–15.0)
LYMPHS ABS: 1.3 10*3/uL (ref 0.7–4.0)
Lymphocytes Relative: 33.4 % (ref 12.0–46.0)
MCHC: 33.4 g/dL (ref 30.0–36.0)
MCV: 92.8 fl (ref 78.0–100.0)
MONO ABS: 0.3 10*3/uL (ref 0.1–1.0)
Monocytes Relative: 8.4 % (ref 3.0–12.0)
NEUTROS ABS: 2.2 10*3/uL (ref 1.4–7.7)
NEUTROS PCT: 53.8 % (ref 43.0–77.0)
PLATELETS: 278 10*3/uL (ref 150.0–400.0)
RBC: 4.45 Mil/uL (ref 3.87–5.11)
RDW: 14.3 % (ref 11.5–15.5)
WBC: 4 10*3/uL (ref 4.0–10.5)

## 2014-09-22 LAB — POCT URINALYSIS DIPSTICK
Bilirubin, UA: NEGATIVE
Blood, UA: NEGATIVE
Glucose, UA: NEGATIVE
Ketones, UA: NEGATIVE
Leukocytes, UA: NEGATIVE
NITRITE UA: NEGATIVE
PROTEIN UA: NEGATIVE
SPEC GRAV UA: 1.02
UROBILINOGEN UA: 0.2
pH, UA: 5.5

## 2014-09-22 LAB — LIPID PANEL
CHOLESTEROL: 278 mg/dL — AB (ref 0–200)
HDL: 85.1 mg/dL (ref >39.00)
LDL Cholesterol: 178 mg/dL — ABNORMAL HIGH (ref 0–99)
NonHDL: 192.42
TRIGLYCERIDES: 70 mg/dL (ref 0.0–149.0)
Total CHOL/HDL Ratio: 3
VLDL: 14 mg/dL (ref 0.0–40.0)

## 2014-09-22 LAB — BASIC METABOLIC PANEL
BUN: 18 mg/dL (ref 6–23)
CHLORIDE: 106 meq/L (ref 96–112)
CO2: 32 meq/L (ref 19–32)
CREATININE: 0.67 mg/dL (ref 0.40–1.20)
Calcium: 9.3 mg/dL (ref 8.4–10.5)
GFR: 91.82 mL/min (ref >60.00)
GLUCOSE: 83 mg/dL (ref 70–99)
Potassium: 5.1 mEq/L (ref 3.5–5.1)
Sodium: 143 mEq/L (ref 135–145)

## 2014-09-22 LAB — HEPATIC FUNCTION PANEL
ALT: 21 U/L (ref 0–35)
AST: 19 U/L (ref 0–37)
Albumin: 4 g/dL (ref 3.5–5.2)
Alkaline Phosphatase: 59 U/L (ref 39–117)
BILIRUBIN DIRECT: 0.1 mg/dL (ref 0.0–0.3)
BILIRUBIN TOTAL: 0.6 mg/dL (ref 0.2–1.2)
TOTAL PROTEIN: 7.3 g/dL (ref 6.0–8.3)

## 2014-09-22 LAB — TSH: TSH: 3.29 u[IU]/mL (ref 0.35–4.50)

## 2014-09-29 ENCOUNTER — Encounter: Payer: Self-pay | Admitting: Internal Medicine

## 2014-09-29 ENCOUNTER — Ambulatory Visit (INDEPENDENT_AMBULATORY_CARE_PROVIDER_SITE_OTHER): Payer: Medicare Other | Admitting: Internal Medicine

## 2014-09-29 VITALS — BP 140/80 | HR 68 | Temp 98.2°F | Ht 68.0 in | Wt 169.0 lb

## 2014-09-29 DIAGNOSIS — E2839 Other primary ovarian failure: Secondary | ICD-10-CM

## 2014-09-29 DIAGNOSIS — Z0000 Encounter for general adult medical examination without abnormal findings: Secondary | ICD-10-CM

## 2014-09-29 DIAGNOSIS — R030 Elevated blood-pressure reading, without diagnosis of hypertension: Secondary | ICD-10-CM

## 2014-09-29 DIAGNOSIS — E785 Hyperlipidemia, unspecified: Secondary | ICD-10-CM

## 2014-09-29 DIAGNOSIS — Z86010 Personal history of colonic polyps: Secondary | ICD-10-CM

## 2014-09-29 NOTE — Patient Instructions (Signed)
Take a calcium supplement, plus [phone] units of vitamin D    It is important that you exercise regularly, at least 20 minutes 3 to 4 times per week.  If you develop chest pain or shortness of breath seek  medical attention.  Health Maintenance Adopting a healthy lifestyle and getting preventive care can go a long way to promote health and wellness. Talk with your health care provider about what schedule of regular examinations is right for you. This is a good chance for you to check in with your provider about disease prevention and staying healthy. In between checkups, there are plenty of things you can do on your own. Experts have done a lot of research about which lifestyle changes and preventive measures are most likely to keep you healthy. Ask your health care provider for more information. WEIGHT AND DIET  Eat a healthy diet  Be sure to include plenty of vegetables, fruits, low-fat dairy products, and lean protein.  Do not eat a lot of foods high in solid fats, added sugars, or salt.  Get regular exercise. This is one of the most important things you can do for your health.  Most adults should exercise for at least 150 minutes each week. The exercise should increase your heart rate and make you sweat (moderate-intensity exercise).  Most adults should also do strengthening exercises at least twice a week. This is in addition to the moderate-intensity exercise.  Maintain a healthy weight  Body mass index (BMI) is a measurement that can be used to identify possible weight problems. It estimates body fat based on height and weight. Your health care provider can help determine your BMI and help you achieve or maintain a healthy weight.  For females 93 years of age and older:   A BMI below 18.5 is considered underweight.  A BMI of 18.5 to 24.9 is normal.  A BMI of 25 to 29.9 is considered overweight.  A BMI of 30 and above is considered obese.  Watch levels of cholesterol and  blood lipids  You should start having your blood tested for lipids and cholesterol at 72 years of age, then have this test every 5 years.  You may need to have your cholesterol levels checked more often if:  Your lipid or cholesterol levels are high.  You are older than 72 years of age.  You are at high risk for heart disease.  CANCER SCREENING   Lung Cancer  Lung cancer screening is recommended for adults 79-23 years old who are at high risk for lung cancer because of a history of smoking.  A yearly low-dose CT scan of the lungs is recommended for people who:  Currently smoke.  Have quit within the past 15 years.  Have at least a 30-pack-year history of smoking. A pack year is smoking an average of one pack of cigarettes a day for 1 year.  Yearly screening should continue until it has been 15 years since you quit.  Yearly screening should stop if you develop a health problem that would prevent you from having lung cancer treatment.  Breast Cancer  Practice breast self-awareness. This means understanding how your breasts normally appear and feel.  It also means doing regular breast self-exams. Let your health care provider know about any changes, no matter how small.  If you are in your 20s or 30s, you should have a clinical breast exam (CBE) by a health care provider every 1-3 years as part of a regular health  exam.  If you are 40 or older, have a CBE every year. Also consider having a breast X-ray (mammogram) every year.  If you have a family history of breast cancer, talk to your health care provider about genetic screening.  If you are at high risk for breast cancer, talk to your health care provider about having an MRI and a mammogram every year.  Breast cancer gene (BRCA) assessment is recommended for women who have family members with BRCA-related cancers. BRCA-related cancers include:  Breast.  Ovarian.  Tubal.  Peritoneal cancers.  Results of the  assessment will determine the need for genetic counseling and BRCA1 and BRCA2 testing. Cervical Cancer Routine pelvic examinations to screen for cervical cancer are no longer recommended for nonpregnant women who are considered low risk for cancer of the pelvic organs (ovaries, uterus, and vagina) and who do not have symptoms. A pelvic examination may be necessary if you have symptoms including those associated with pelvic infections. Ask your health care provider if a screening pelvic exam is right for you.   The Pap test is the screening test for cervical cancer for women who are considered at risk.  If you had a hysterectomy for a problem that was not cancer or a condition that could lead to cancer, then you no longer need Pap tests.  If you are older than 65 years, and you have had normal Pap tests for the past 10 years, you no longer need to have Pap tests.  If you have had past treatment for cervical cancer or a condition that could lead to cancer, you need Pap tests and screening for cancer for at least 20 years after your treatment.  If you no longer get a Pap test, assess your risk factors if they change (such as having a new sexual partner). This can affect whether you should start being screened again.  Some women have medical problems that increase their chance of getting cervical cancer. If this is the case for you, your health care provider may recommend more frequent screening and Pap tests.  The human papillomavirus (HPV) test is another test that may be used for cervical cancer screening. The HPV test looks for the virus that can cause cell changes in the cervix. The cells collected during the Pap test can be tested for HPV.  The HPV test can be used to screen women 37 years of age and older. Getting tested for HPV can extend the interval between normal Pap tests from three to five years.  An HPV test also should be used to screen women of any age who have unclear Pap test  results.  After 72 years of age, women should have HPV testing as often as Pap tests.  Colorectal Cancer  This type of cancer can be detected and often prevented.  Routine colorectal cancer screening usually begins at 72 years of age and continues through 72 years of age.  Your health care provider may recommend screening at an earlier age if you have risk factors for colon cancer.  Your health care provider may also recommend using home test kits to check for hidden blood in the stool.  A small camera at the end of a tube can be used to examine your colon directly (sigmoidoscopy or colonoscopy). This is done to check for the earliest forms of colorectal cancer.  Routine screening usually begins at age 10.  Direct examination of the colon should be repeated every 5-10 years through 72 years of  age. However, you may need to be screened more often if early forms of precancerous polyps or small growths are found. Skin Cancer  Check your skin from head to toe regularly.  Tell your health care provider about any new moles or changes in moles, especially if there is a change in a mole's shape or color.  Also tell your health care provider if you have a mole that is larger than the size of a pencil eraser.  Always use sunscreen. Apply sunscreen liberally and repeatedly throughout the day.  Protect yourself by wearing long sleeves, pants, a wide-brimmed hat, and sunglasses whenever you are outside. HEART DISEASE, DIABETES, AND HIGH BLOOD PRESSURE   Have your blood pressure checked at least every 1-2 years. High blood pressure causes heart disease and increases the risk of stroke.  If you are between 65 years and 37 years old, ask your health care provider if you should take aspirin to prevent strokes.  Have regular diabetes screenings. This involves taking a blood sample to check your fasting blood sugar level.  If you are at a normal weight and have a low risk for diabetes, have this  test once every three years after 72 years of age.  If you are overweight and have a high risk for diabetes, consider being tested at a younger age or more often. PREVENTING INFECTION  Hepatitis B  If you have a higher risk for hepatitis B, you should be screened for this virus. You are considered at high risk for hepatitis B if:  You were born in a country where hepatitis B is common. Ask your health care provider which countries are considered high risk.  Your parents were born in a high-risk country, and you have not been immunized against hepatitis B (hepatitis B vaccine).  You have HIV or AIDS.  You use needles to inject street drugs.  You live with someone who has hepatitis B.  You have had sex with someone who has hepatitis B.  You get hemodialysis treatment.  You take certain medicines for conditions, including cancer, organ transplantation, and autoimmune conditions. Hepatitis C  Blood testing is recommended for:  Everyone born from [birth year] through 1963.  Anyone with known risk factors for hepatitis C. Sexually transmitted infections (STIs)  You should be screened for sexually transmitted infections (STIs) including gonorrhea and chlamydia if:  You are sexually active and are younger than 72 years of age.  You are older than 72 years of age and your health care provider tells you that you are at risk for this type of infection.  Your sexual activity has changed since you were last screened and you are at an increased risk for chlamydia or gonorrhea. Ask your health care provider if you are at risk.  If you do not have HIV, but are at risk, it may be recommended that you take a prescription medicine daily to prevent HIV infection. This is called pre-exposure prophylaxis (PrEP). You are considered at risk if:  You are sexually active and do not regularly use condoms or know the HIV status of your partner(s).  You take drugs by injection.  You are sexually active with  a partner who has HIV. Talk with your health care provider about whether you are at high risk of being infected with HIV. If you choose to begin PrEP, you should first be tested for HIV. You should then be tested every 3 months for as long as you are taking PrEP.  PREGNANCY  If you are premenopausal and you may become pregnant, ask your health care provider about preconception counseling.  If you may become pregnant, take 400 to 800 micrograms (mcg) of folic acid every day.  If you want to prevent pregnancy, talk to your health care provider about birth control (contraception). OSTEOPOROSIS AND MENOPAUSE   Osteoporosis is a disease in which the bones lose minerals and strength with aging. This can result in serious bone fractures. Your risk for osteoporosis can be identified using a bone density scan.  If you are 79 years of age or older, or if you are at risk for osteoporosis and fractures, ask your health care provider if you should be screened.  Ask your health care provider whether you should take a calcium or vitamin D supplement to lower your risk for osteoporosis.  Menopause may have certain physical symptoms and risks.  Hormone replacement therapy may reduce some of these symptoms and risks. Talk to your health care provider about whether hormone replacement therapy is right for you.  HOME CARE INSTRUCTIONS   Schedule regular health, dental, and eye exams.  Stay current with your immunizations.   Do not use any tobacco products including cigarettes, chewing tobacco, or electronic cigarettes.  If you are pregnant, do not drink alcohol.  If you are breastfeeding, limit how much and how often you drink alcohol.  Limit alcohol intake to no more than 1 drink per day for nonpregnant women. One drink equals 12 ounces of beer, 5 ounces of wine, or 1 ounces of hard liquor.  Do not use street drugs.  Do not share needles.  Ask your health care provider for help if you need  support or information about quitting drugs.  Tell your health care provider if you often feel depressed.  Tell your health care provider if you have ever been abused or do not feel safe at home. Document Released: 07/23/2010 Document Revised: 05/24/2013 Document Reviewed: 12/09/2012 [facility] Patient Information 2015 [place], [place]. This information is not intended to replace advice given to you by your health care provider. Make sure you discuss any questions you have with your health care provider.

## 2014-09-29 NOTE — Progress Notes (Signed)
Patient received education resource, including the self-management goal and tool. Patient verbalized understanding. 

## 2014-09-29 NOTE — Progress Notes (Signed)
Patient ID: [name], female   DOB: 22-Aug-1940, 74 y.o.   MRN: [id number]  Subjective:    Patient ID: [name], female    DOB: [birth year], 74 y.o.   MRN: [id number]  Rash Pertinent negatives include no congestion, cough, diarrhea, eye pain, fatigue, fever, shortness of breath, sore throat or vomiting.   History of Present Illness:   74  year old patient who is seen today for a preventive health examination.  She is followed by OB/GYN at [facility]. She is a prior hysterectomy and has had a recent gynecologic exam. She has a history of occasional palpitations that have not been bothersome of late. She has been  on Crestor for dyslipidemia, which she discontinued approximately 3 years ago; she has a high HDL cholesterol, and lipid profile is not high risk;  also has a history colonic polyps. She is up-to-date on her colonoscopies. She otherwise is asymptomatic.  She has had a recent mammogram and colonoscopy.  Lipid profile reviewed from last year which revealed a quite elevated HDL cholesterol and a total cholesterol HDL ratio of 3.  Wt Readings from Last 3 Encounters:  09/29/14 169 lb (76.658 kg)  08/11/14 166 lb (75.297 kg)  06/23/13 159 lb 12.8 oz (72.485 kg)   Here for Medicare AWV:   1. Risk factors based on Past M, S, F history: cardiovascular risk factors include dyslipidemia (high HDL) 2. Physical Activities: remains active without exercise limitation; visits  her health club 5 or 6 times weekly 3. Depression/mood: no history depression, or mood disorder, has had some mild anxiety issues in the past due to situational stressors, but stable at present  4. Hearing: no deficits  5. ADL's: independent in all aspects of daily living. Still works full-time  6. Fall Risk: low  7. Home Safety: no problems identifying  8. Height, weight, &visual acuity:height and weight stable. No change in visual acuity  9. Counseling: mammogram will be obtained next month follow-up  colonoscopy when due encouraged  10. Labs ordered based on risk factors: laboratory studies will be reviewed, including lipid panel. A vitamin D level. Also be checked in view of her history of vitamin D deficiency  11. Referral Coordination- eye examination scheduled for later this month  12. Care Plan- heart healthy diet, calcium and vitamin D supplementation, and regular. Exercise, all encouraged. Modest weight loss encouraged  13. Cognitive Assessment- alert and oriented, with normal affect, handles all executive functioning- husband had advanced dementia  74.  Preventive services will include annual clinical exams with screening lab.  The patient was seen by ophthalmology, dermatology annually.  She has annual mammograms.  Will set up for a bone density study.  We'll continue colonoscopies at five-year intervals 15.  Provider list includes primary care GI ophthalmology, dermatology and gynecology  Allergies (verified):  No Known Drug Allergies   Past History:  Past Medical History:   Hyperlipidemia  Colonic polyps, hx of  palpitations  Diverticulosis, colon   Past Surgical History:  Hysterectomy ([place])  Lumbar laminectomy  Rotator cuff repair  colonoscopy 12/09 2015  Family History:    father recently died at 65. Mother died age 72. History of COPD, diabetes one brother, status post melanoma. One sister with diabetes s/p stent   Social History:    Widowed; husband died of  advanced dementia    Review of Systems  Constitutional: Negative for fever, appetite change, fatigue and unexpected weight change.  HENT: Negative for congestion, dental problem,  ear pain, hearing loss, mouth sores, nosebleeds, sinus pressure, sore throat, tinnitus, trouble swallowing and voice change.   Eyes: Negative for photophobia, pain, redness and visual disturbance.  Respiratory: Negative for cough, chest tightness and shortness of breath.   Cardiovascular: Negative for chest pain,  palpitations and leg swelling.  Gastrointestinal: Negative for nausea, vomiting, abdominal pain, diarrhea, constipation, blood in stool, abdominal distention and rectal pain.  Genitourinary: Negative for dysuria, urgency, frequency, hematuria, flank pain, vaginal bleeding, vaginal discharge, difficulty urinating, genital sores, vaginal pain, menstrual problem and pelvic pain.  Musculoskeletal: Negative for back pain, arthralgias and neck stiffness.  Skin: Positive for rash.  Neurological: Negative for dizziness, syncope, speech difficulty, weakness, light-headedness, numbness and headaches.  Hematological: Negative for adenopathy. Does not bruise/bleed easily.  Psychiatric/Behavioral: Negative for suicidal ideas, behavioral problems, self-injury, dysphoric mood and agitation. The patient is not nervous/anxious.        Objective:   Physical Exam  Constitutional: She is oriented to person, place, and time. She appears well-developed and well-nourished.  HENT:  Head: Normocephalic and atraumatic.  Right Ear: External ear normal.  Left Ear: External ear normal.  Mouth/Throat: Oropharynx is clear and moist.  Eyes: Conjunctivae and EOM are normal.  Neck: Normal range of motion. Neck supple. No JVD present. No thyromegaly present.  Cardiovascular: Normal rate, regular rhythm, normal heart sounds and intact distal pulses.   No murmur heard. Pulmonary/Chest: Effort normal and breath sounds normal. She has no wheezes. She has no rales.  Abdominal: Soft. Bowel sounds are normal. She exhibits no distension and no mass. There is no tenderness. There is no rebound and no guarding.  Musculoskeletal: Normal range of motion. She exhibits no edema or tenderness.  Neurological: She is alert and oriented to person, place, and time. She has normal reflexes. No cranial nerve deficit. She exhibits normal muscle tone. Coordination normal.  Skin: Skin is warm and dry. No rash noted.  Psychiatric: She has a normal  mood and affect. Her behavior is normal.          Assessment & Plan:    Preventive health examination Colonic  Polyps will need followup colonoscopy every 5 years History of dyslipidemia.    Recheck one year

## 2014-10-17 ENCOUNTER — Other Ambulatory Visit: Payer: Medicare Other

## 2014-10-20 ENCOUNTER — Inpatient Hospital Stay: Admission: RE | Admit: 2014-10-20 | Payer: Medicare Other | Source: Ambulatory Visit

## 2014-10-25 LAB — HM MAMMOGRAPHY

## 2014-10-26 ENCOUNTER — Inpatient Hospital Stay
Admission: RE | Admit: 2014-10-26 | Discharge: 2014-10-26 | Disposition: A | Discharge status: Home / Self Care | Payer: Medicare Other | Source: Ambulatory Visit | Attending: Internal Medicine | Admitting: Internal Medicine

## 2014-10-26 DIAGNOSIS — E2839 Other primary ovarian failure: Secondary | ICD-10-CM

## 2014-10-27 ENCOUNTER — Encounter: Payer: Self-pay | Admitting: Internal Medicine

## 2014-11-04 ENCOUNTER — Telehealth: Payer: Self-pay | Admitting: Internal Medicine

## 2014-11-04 DIAGNOSIS — E2839 Other primary ovarian failure: Secondary | ICD-10-CM

## 2014-11-04 DIAGNOSIS — Z139 Encounter for screening, unspecified: Secondary | ICD-10-CM

## 2014-11-04 NOTE — Telephone Encounter (Signed)
Spoke with [facility]; she states pt has rescheduled a couple of times and epic will not let her schedule pt for today.  Bone density re-ordered.

## 2014-11-04 NOTE — Telephone Encounter (Signed)
[name] from X-Ray at [place] is needing a new bone density order before 12 o'cloxk if possible.

## 2014-11-23 ENCOUNTER — Ambulatory Visit (INDEPENDENT_AMBULATORY_CARE_PROVIDER_SITE_OTHER)
Admission: RE | Admit: 2014-11-23 | Discharge: 2014-11-23 | Disposition: A | Discharge status: Home / Self Care | Payer: Medicare Other | Source: Ambulatory Visit | Attending: Internal Medicine | Admitting: Internal Medicine

## 2014-11-23 DIAGNOSIS — Z139 Encounter for screening, unspecified: Secondary | ICD-10-CM

## 2014-11-23 DIAGNOSIS — E2839 Other primary ovarian failure: Secondary | ICD-10-CM | POA: Diagnosis not present

## 2014-12-13 ENCOUNTER — Telehealth: Payer: Self-pay | Admitting: Internal Medicine

## 2014-12-13 NOTE — Telephone Encounter (Signed)
Patient called the billing dept about her 09/29/14 preventative doctor's visit because she still has a balance.  The billing dept instructed her to call the provider's office.  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

## 2014-12-14 NOTE — Telephone Encounter (Signed)
After review patient only has [facility] Medicare. It is one plan - not two. Called patient [facility] and advised that per billing:  PBO DOS 09/29/2014 I called [employer] and spoke with Chavi Ref # [id number], she said they denied this because the pt can only have 1 preventative visit per year. The rep also stated the pt would need to call Member Services if she has a question regarding this.   I explained that another providers office has used code [phone] at some point this year. [employer] will not tell us what office used this code - patient has to contact them directly.

## 2015-03-07 ENCOUNTER — Telehealth: Payer: Self-pay | Admitting: Internal Medicine

## 2015-03-07 NOTE — Telephone Encounter (Signed)
PLEASE NOTE: All timestamps contained within this report are represented as [place] Standard Time. CONFIDENTIALTY NOTICE: This fax transmission is intended only for the addressee. It contains information that is legally privileged, confidential or otherwise protected from use or disclosure. If you are not the intended recipient, you are strictly prohibited from reviewing, disclosing, copying using or disseminating any of this information or taking any action in reliance on or regarding this information. If you have received this fax in error, please notify us immediately by telephone so that we can arrange for its return to [place]. Phone: [phone], Toll-Free: [phone], Fax: [phone] Page: 1 of 1 Call Id: [id number] [facility] - Client [place] Patient Name: [name] DOB: 26-Feb-1940 Initial Comment Caller states she's having constipation. When she wipes there is some blood. Nurse Assessment Nurse: Marcelline Deist, RN, Lynda Date/Time (Eastern Time): 03/07/2015 10:49:08 AM Confirm and document reason for call. If symptomatic, describe symptoms. You must click the next button to save text entered. ---Caller states she's having constipation. When she wipes there was a small amount of blood on paper. Noticed the blood today. After eating something late last week, she felt like she had to have a BM, but couldn't. Had one yesterday & this am. Feels like she has to go, but can't go. Has been straining. Has the patient traveled out of the country within the last 30 days? ---Not Applicable Does the patient have any new or worsening symptoms? ---Yes Will a triage be completed? ---Yes Related visit to physician within the last 2 weeks? ---No Does the PT have any chronic conditions? (i.e. diabetes, asthma, etc.) ---No Is this a behavioral health or substance abuse call? ---No Guidelines Guideline Title Affirmed Question  Affirmed Notes Rectal Bleeding Tarry or jet black-colored stool (not dark green) Final Disposition User Go to ED Now Marcelline Deist, RN, Lynda Referrals [facility] - ED Disagree/Comply: Comply

## 2015-03-07 NOTE — Telephone Encounter (Signed)
See below

## 2015-03-07 NOTE — Telephone Encounter (Signed)
FYI

## 2015-03-08 ENCOUNTER — Ambulatory Visit (INDEPENDENT_AMBULATORY_CARE_PROVIDER_SITE_OTHER): Payer: Medicare Other | Admitting: Internal Medicine

## 2015-03-08 ENCOUNTER — Encounter: Payer: Self-pay | Admitting: Internal Medicine

## 2015-03-08 VITALS — BP 118/70 | HR 71 | Temp 98.2°F | Resp 20 | Ht 68.0 in | Wt 172.0 lb

## 2015-03-08 DIAGNOSIS — K5900 Constipation, unspecified: Secondary | ICD-10-CM

## 2015-03-08 DIAGNOSIS — E785 Hyperlipidemia, unspecified: Secondary | ICD-10-CM

## 2015-03-08 DIAGNOSIS — Z86010 Personal history of colonic polyps: Secondary | ICD-10-CM

## 2015-03-08 NOTE — Patient Instructions (Signed)
Call or return to clinic prn if these symptoms worsen or fail to improve as anticipated.  Constipation, Adult Constipation is when a person has fewer than three bowel movements a week, has difficulty having a bowel movement, or has stools that are dry, hard, or larger than normal. As people grow older, constipation is more common. A low-fiber diet, not taking in enough fluids, and taking certain medicines may make constipation worse.  CAUSES   Certain medicines, such as antidepressants, pain medicine, iron supplements, antacids, and water pills.   Certain diseases, such as diabetes, irritable bowel syndrome (IBS), thyroid disease, or depression.   Not drinking enough water.   Not eating enough fiber-rich foods.   Stress or travel.   Lack of physical activity or exercise.   Ignoring the urge to have a bowel movement.   Using laxatives too much.  SIGNS AND SYMPTOMS   Having fewer than three bowel movements a week.   Straining to have a bowel movement.   Having stools that are hard, dry, or larger than normal.   Feeling full or bloated.   Pain in the lower abdomen.   Not feeling relief after having a bowel movement.  DIAGNOSIS  Your health care provider will take a medical history and perform a physical exam. Further testing may be done for severe constipation. Some tests may include:  A barium enema X-ray to examine your rectum, colon, and, sometimes, your small intestine.   A sigmoidoscopy to examine your lower colon.   A colonoscopy to examine your entire colon. TREATMENT  Treatment will depend on the severity of your constipation and what is causing it. Some dietary treatments include drinking more fluids and eating more fiber-rich foods. Lifestyle treatments may include regular exercise. If these diet and lifestyle recommendations do not help, your health care provider may recommend taking over-the-counter laxative medicines to help you have bowel  movements. Prescription medicines may be prescribed if over-the-counter medicines do not work.  HOME CARE INSTRUCTIONS   Eat foods that have a lot of fiber, such as fruits, vegetables, whole grains, and beans.  Limit foods high in fat and processed sugars, such as french fries, hamburgers, cookies, candies, and soda.   A fiber supplement may be added to your diet if you cannot get enough fiber from foods.   Drink enough fluids to keep your urine clear or pale yellow.   Exercise regularly or as directed by your health care provider.   Go to the restroom when you have the urge to go. Do not hold it.   Only take over-the-counter or prescription medicines as directed by your health care provider. Do not take other medicines for constipation without talking to your health care provider first.  Pope IF:   You have bright red blood in your stool.   Your constipation lasts for more than 4 days or gets worse.   You have abdominal or rectal pain.   You have thin, pencil-like stools.   You have unexplained weight loss. MAKE SURE YOU:   Understand these instructions.  Will watch your condition.  Will get help right away if you are not doing well or get worse.   This information is not intended to replace advice given to you by your health care provider. Make sure you discuss any questions you have with your health care provider.   Document Released: 10/06/2003 Document Revised: 01/28/2014 Document Reviewed: 10/19/2012 Elsevier Interactive Patient Education [employer].

## 2015-03-08 NOTE — Progress Notes (Signed)
Pre visit review using our clinic review tool, if applicable. No additional management support is needed unless otherwise documented below in the visit note. 

## 2015-03-08 NOTE — Progress Notes (Signed)
Subjective:    Patient ID: [name], female    DOB: [birth year], 75 y.o.   MRN: [id number]  HPI  75 year old patient who has a history diverticulosis and enjoys a high-fiber diet.  She also has a history colonic polyps and is status post follow-up colonoscopy 2 years ago.  For the past few days she has had some constipation issues.  In spite of her high fiber diet.  2 days ago, she had a single episode of scanty bright red rectal bleeding on the tissue paper ; 1 bowel movement seemed darker than normal. Today she had a fairly normal bowel movement that was slightly loose.  Past Medical History  Diagnosis Date  . COLONIC POLYPS, HX OF 10/31/2006  . DIVERTICULOSIS, COLON 10/21/2008  . ELEVATED BP READING WITHOUT DX HYPERTENSION 07/12/2009  . HYPERLIPIDEMIA 07/01/2006  . PALPITATIONS, OCCASIONAL 10/21/2008  . VERTIGO 07/25/2009    Social History   Social History  . Marital Status: Married    Spouse Name: N/A  . Number of Children: N/A  . Years of Education: N/A   Occupational History  . Not on file.   Social History Main Topics  . Smoking status: Never Smoker   . Smokeless tobacco: Never Used  . Alcohol Use: Yes     Comment: wine occ  . Drug Use: No  . Sexual Activity: Not on file   Other Topics Concern  . Not on file   Social History Narrative    Past Surgical History  Procedure Laterality Date  . Abdominal hysterectomy    . Laminectomy      lumbar  . Rotator cuff repair      Family History  Problem Relation Age of Onset  . Diabetes Sister   . Diabetes Brother   . Colon cancer Neg Hx   . Pancreatic cancer Neg Hx   . Stomach cancer Neg Hx     No Known Allergies  Current Outpatient Prescriptions on File Prior to Visit  Medication Sig Dispense Refill  . desoximetasone (TOPICORT) 0.25 % cream Apply 1 application topically as needed.      No current facility-administered medications on file prior to visit.    BP 118/70 mmHg  Pulse 71  Temp(Src)  98.2 F (36.8 C) (Oral)  Resp 20  Ht 5\' 8"  (1.727 m)  Wt 172 lb (78.019 kg)  BMI 26.16 kg/m2  SpO2 98%    Review of Systems  Constitutional: Negative.   HENT: Negative for congestion, dental problem, hearing loss, rhinorrhea, sinus pressure, sore throat and tinnitus.   Eyes: Negative for pain, discharge and visual disturbance.  Respiratory: Negative for cough and shortness of breath.   Cardiovascular: Negative for chest pain, palpitations and leg swelling.  Gastrointestinal: Positive for constipation. Negative for nausea, vomiting, abdominal pain, diarrhea, blood in stool and abdominal distention.  Genitourinary: Negative for dysuria, urgency, frequency, hematuria, flank pain, vaginal bleeding, vaginal discharge, difficulty urinating, vaginal pain and pelvic pain.  Musculoskeletal: Negative for joint swelling, arthralgias and gait problem.  Skin: Negative for rash.  Neurological: Negative for dizziness, syncope, speech difficulty, weakness, numbness and headaches.  Hematological: Negative for adenopathy.  Psychiatric/Behavioral: Negative for behavioral problems, dysphoric mood and agitation. The patient is not nervous/anxious.        Objective:   Physical Exam  Constitutional: She is oriented to person, place, and time. She appears well-developed and well-nourished.  HENT:  Head: Normocephalic.  Right Ear: External ear normal.  Left Ear: External ear normal.  Mouth/Throat: Oropharynx is clear and moist.  Eyes: Conjunctivae and EOM are normal. Pupils are equal, round, and reactive to light.  Neck: Normal range of motion. Neck supple. No thyromegaly present.  Cardiovascular: Normal rate, regular rhythm, normal heart sounds and intact distal pulses.   Pulmonary/Chest: Effort normal and breath sounds normal.  Abdominal: Soft. Bowel sounds are normal. She exhibits no distension and no mass. There is no tenderness. There is no rebound.  Musculoskeletal: Normal range of motion.    Lymphadenopathy:    She has no cervical adenopathy.  Neurological: She is alert and oriented to person, place, and time.  Skin: Skin is warm and dry. No rash noted.  Psychiatric: She has a normal mood and affect. Her behavior is normal.          Assessment & Plan:   Intermittent constipation Diverticular disease.  Will continue high-fiber diet.  Will increase fluid intake.  The patient does exercise regularly, including going to the gym daily History colonic polyps.  Follow-up colonoscopy in 3 years as scheduled

## 2015-04-24 ENCOUNTER — Encounter: Payer: Self-pay | Admitting: Internal Medicine

## 2015-04-24 ENCOUNTER — Ambulatory Visit (INDEPENDENT_AMBULATORY_CARE_PROVIDER_SITE_OTHER): Payer: Medicare Other | Admitting: Internal Medicine

## 2015-04-24 VITALS — BP 110/70 | HR 75 | Temp 97.8°F | Resp 20 | Ht 68.0 in | Wt 173.0 lb

## 2015-04-24 DIAGNOSIS — E785 Hyperlipidemia, unspecified: Secondary | ICD-10-CM

## 2015-04-24 DIAGNOSIS — R030 Elevated blood-pressure reading, without diagnosis of hypertension: Secondary | ICD-10-CM

## 2015-04-24 DIAGNOSIS — K219 Gastro-esophageal reflux disease without esophagitis: Secondary | ICD-10-CM | POA: Diagnosis not present

## 2015-04-24 DIAGNOSIS — R5382 Chronic fatigue, unspecified: Secondary | ICD-10-CM | POA: Diagnosis not present

## 2015-04-24 LAB — CBC WITH DIFFERENTIAL/PLATELET
BASOS PCT: 0.5 % (ref 0.0–3.0)
Basophils Absolute: 0 10*3/uL (ref 0.0–0.1)
EOS ABS: 0.1 10*3/uL (ref 0.0–0.7)
Eosinophils Relative: 1.6 % (ref 0.0–5.0)
HCT: 39.9 % (ref 36.0–46.0)
Hemoglobin: 13.4 g/dL (ref 12.0–15.0)
Lymphocytes Relative: 20.7 % (ref 12.0–46.0)
Lymphs Abs: 1.5 10*3/uL (ref 0.7–4.0)
MCHC: 33.5 g/dL (ref 30.0–36.0)
MCV: 90.8 fl (ref 78.0–100.0)
MONO ABS: 0.5 10*3/uL (ref 0.1–1.0)
Monocytes Relative: 7.3 % (ref 3.0–12.0)
NEUTROS ABS: 5.2 10*3/uL (ref 1.4–7.7)
NEUTROS PCT: 69.9 % (ref 43.0–77.0)
PLATELETS: 311 10*3/uL (ref 150.0–400.0)
RBC: 4.4 Mil/uL (ref 3.87–5.11)
RDW: 14.8 % (ref 11.5–15.5)
WBC: 7.4 10*3/uL (ref 4.0–10.5)

## 2015-04-24 LAB — COMPREHENSIVE METABOLIC PANEL
ALBUMIN: 4.3 g/dL (ref 3.5–5.2)
ALK PHOS: 58 U/L (ref 39–117)
ALT: 20 U/L (ref 0–35)
AST: 17 U/L (ref 0–37)
BILIRUBIN TOTAL: 0.4 mg/dL (ref 0.2–1.2)
BUN: 23 mg/dL (ref 6–23)
CO2: 30 mEq/L (ref 19–32)
CREATININE: 0.84 mg/dL (ref 0.40–1.20)
Calcium: 9.4 mg/dL (ref 8.4–10.5)
Chloride: 102 mEq/L (ref 96–112)
GFR: 70.62 mL/min (ref >60.00)
Glucose, Bld: 109 mg/dL — ABNORMAL HIGH (ref 70–99)
Potassium: 4.9 mEq/L (ref 3.5–5.1)
Sodium: 139 mEq/L (ref 135–145)
Total Protein: 7.4 g/dL (ref 6.0–8.3)

## 2015-04-24 LAB — TSH: TSH: 2.72 u[IU]/mL (ref 0.35–4.50)

## 2015-04-24 LAB — SEDIMENTATION RATE: SED RATE: 31 mm/h — AB (ref 0–22)

## 2015-04-24 MED ORDER — OMEPRAZOLE 20 MG PO CPDR: 20.0000 mg | DELAYED_RELEASE_CAPSULE | Freq: Every day | ORAL | Status: DC

## 2015-04-24 NOTE — Progress Notes (Signed)
Pre visit review using our clinic review tool, if applicable. No additional management support is needed unless otherwise documented below in the visit note. 

## 2015-04-24 NOTE — Patient Instructions (Addendum)
Avoids foods high in acid such as tomatoes citrus juices, and spicy foods.  Avoid eating within two hours of lying down or before exercising.  Do not overheat.  Try smaller more frequent meals.  If symptoms persist, we will consider an abdominal ultrasound  Food Choices for Gastroesophageal Reflux Disease, Adult When you have gastroesophageal reflux disease (GERD), the foods you eat and your eating habits are very important. Choosing the right foods can help ease the discomfort of GERD. WHAT GENERAL GUIDELINES DO I NEED TO FOLLOW?  Choose fruits, vegetables, whole grains, low-fat dairy products, and low-fat meat, fish, and poultry.  Limit fats such as oils, salad dressings, butter, nuts, and avocado.  Keep a food diary to identify foods that cause symptoms.  Avoid foods that cause reflux. These may be different for different people.  Eat frequent small meals instead of three large meals each day.  Eat your meals slowly, in a relaxed setting.  Limit fried foods.  Cook foods using methods other than frying.  Avoid drinking alcohol.  Avoid drinking large amounts of liquids with your meals.  Avoid bending over or lying down until 2-3 hours after eating. WHAT FOODS ARE NOT RECOMMENDED? The following are some foods and drinks that may worsen your symptoms: Vegetables Tomatoes. Tomato juice. Tomato and spaghetti sauce. Chili peppers. Onion and garlic. Horseradish. Fruits Oranges, grapefruit, and lemon (fruit and juice). Meats High-fat meats, fish, and poultry. This includes hot dogs, ribs, ham, sausage, salami, and bacon. Dairy Whole milk and chocolate milk. Sour cream. Cream. Butter. Ice cream. Cream cheese.  Beverages Coffee and tea, with or without caffeine. Carbonated beverages or energy drinks. Condiments Hot sauce. Barbecue sauce.  Sweets/Desserts Chocolate and cocoa. Donuts. Peppermint and spearmint. Fats and Oils High-fat foods, including [place] fries and potato  chips. Other Vinegar. Strong spices, such as black pepper, white pepper, red pepper, cayenne, curry powder, cloves, ginger, and chili powder. The items listed above may not be a complete list of foods and beverages to avoid. Contact your dietitian for more information.   This information is not intended to replace advice given to you by your health care provider. Make sure you discuss any questions you have with your health care provider.   Document Released: 01/07/2005 Document Revised: 01/28/2014 Document Reviewed: 11/11/2012 Elsevier Interactive Patient Education [employer].

## 2015-04-24 NOTE — Progress Notes (Signed)
Subjective:    Patient ID: [name], female    DOB: October 21, 1940, 75 y.o.   MRN: [id number]  HPI  75 year old patient who presents with 2 concerns. The past 5 months.  She has had some fatigue.  For the past 5 months.  She is also lost 7 pounds in weight. Her chief complaint is dyspepsia.  She describes a burning substernal chest pain up to the throat.  It occurs perhaps once per week and last 10 minutes.  Last night.  This awoke her from sleep.  She did have an abdominal ultrasound in January 2009 that revealed a small gallbladder polyp but no stones.  She is concerned about gallbladder pathology.  She has taken no H2 blocker or PPI therapy  Past Medical History  Diagnosis Date  . COLONIC POLYPS, HX OF 10/31/2006  . DIVERTICULOSIS, COLON 10/21/2008  . ELEVATED BP READING WITHOUT DX HYPERTENSION 07/12/2009  . HYPERLIPIDEMIA 07/01/2006  . PALPITATIONS, OCCASIONAL 10/21/2008  . VERTIGO 07/25/2009    Social History   Social History  . Marital Status: Married    Spouse Name: N/A  . Number of Children: N/A  . Years of Education: N/A   Occupational History  . Not on file.   Social History Main Topics  . Smoking status: Never Smoker   . Smokeless tobacco: Never Used  . Alcohol Use: Yes     Comment: wine occ  . Drug Use: No  . Sexual Activity: Not on file   Other Topics Concern  . Not on file   Social History Narrative    Past Surgical History  Procedure Laterality Date  . Abdominal hysterectomy    . Laminectomy      lumbar  . Rotator cuff repair      Family History  Problem Relation Age of Onset  . Diabetes Sister   . Diabetes Brother   . Colon cancer Neg Hx   . Pancreatic cancer Neg Hx   . Stomach cancer Neg Hx     No Known Allergies  Current Outpatient Prescriptions on File Prior to Visit  Medication Sig Dispense Refill  . desoximetasone (TOPICORT) 0.25 % cream Apply 1 application topically as needed.      No current facility-administered medications  on file prior to visit.    BP 110/70 mmHg  Pulse 75  Temp(Src) 97.8 F (36.6 C) (Oral)  Resp 20  Ht 5\' 8"  (1.727 m)  Wt 173 lb (78.472 kg)  BMI 26.31 kg/m2  SpO2 98%     Review of Systems  Constitutional: Positive for activity change and fatigue.  HENT: Negative for congestion, dental problem, hearing loss, rhinorrhea, sinus pressure, sore throat and tinnitus.   Eyes: Negative for pain, discharge and visual disturbance.  Respiratory: Negative for cough and shortness of breath.   Cardiovascular: Negative for chest pain, palpitations and leg swelling.  Gastrointestinal: Positive for abdominal pain. Negative for nausea, vomiting, diarrhea, constipation, blood in stool and abdominal distention.  Genitourinary: Negative for dysuria, urgency, frequency, hematuria, flank pain, vaginal bleeding, vaginal discharge, difficulty urinating, vaginal pain and pelvic pain.  Musculoskeletal: Negative for joint swelling, arthralgias and gait problem.  Skin: Negative for rash.  Neurological: Positive for weakness. Negative for dizziness, syncope, speech difficulty, numbness and headaches.  Hematological: Negative for adenopathy.  Psychiatric/Behavioral: Negative for behavioral problems, dysphoric mood and agitation. The patient is not nervous/anxious.        Objective:   Physical Exam  Constitutional: She is oriented to person, place, and  time. She appears well-developed and well-nourished.  HENT:  Head: Normocephalic.  Right Ear: External ear normal.  Left Ear: External ear normal.  Mouth/Throat: Oropharynx is clear and moist.  Eyes: Conjunctivae and EOM are normal. Pupils are equal, round, and reactive to light.  Neck: Normal range of motion. Neck supple. No thyromegaly present.  Cardiovascular: Normal rate, regular rhythm, normal heart sounds and intact distal pulses.   Pulmonary/Chest: Effort normal and breath sounds normal.  Abdominal: Soft. Bowel sounds are normal. She exhibits no  distension and no mass. There is no tenderness. There is no rebound and no guarding.  Musculoskeletal: Normal range of motion.  Lymphadenopathy:    She has no cervical adenopathy.  Neurological: She is alert and oriented to person, place, and time.  Skin: Skin is warm and dry. No rash noted.  Psychiatric: She has a normal mood and affect. Her behavior is normal.          Assessment & Plan:   Probable GERD.  Options discussed.  Will place on aggressive anti-GERD regimen including PPI therapy for 30 days and observe her clinical response. Consider follow-up gallbladder ultrasound.  If unimproved Fatigue.  We'll check updated lab

## 2015-05-18 ENCOUNTER — Encounter: Payer: Self-pay | Admitting: Internal Medicine

## 2015-05-22 ENCOUNTER — Other Ambulatory Visit: Payer: Self-pay | Admitting: Internal Medicine

## 2015-05-22 MED ORDER — ESCITALOPRAM OXALATE 10 MG PO TABS: 10.0000 mg | ORAL_TABLET | Freq: Every day | ORAL | Status: DC

## 2015-09-28 ENCOUNTER — Telehealth: Payer: Self-pay | Admitting: Internal Medicine

## 2015-09-28 NOTE — Telephone Encounter (Signed)
° °  Pt had to cancel her appt for 10/02/15 because of a family emergency. She wanted to reschedule before the end of the year so I used 10:30 on 11/29/15. I f this is not ok let me know and I will call her back

## 2015-09-28 NOTE — Telephone Encounter (Signed)
[name], that is fine. 

## 2015-10-02 ENCOUNTER — Encounter: Payer: Medicare Other | Admitting: Internal Medicine

## 2015-10-12 ENCOUNTER — Ambulatory Visit (INDEPENDENT_AMBULATORY_CARE_PROVIDER_SITE_OTHER): Payer: Medicare Other | Admitting: Podiatry

## 2015-10-12 ENCOUNTER — Encounter: Payer: Self-pay | Admitting: Podiatry

## 2015-10-12 VITALS — BP 174/89 | HR 63 | Resp 14 | Ht 68.0 in | Wt 160.0 lb

## 2015-10-12 DIAGNOSIS — S91209A Unspecified open wound of unspecified toe(s) with damage to nail, initial encounter: Secondary | ICD-10-CM | POA: Diagnosis not present

## 2015-10-12 NOTE — Progress Notes (Signed)
   Subjective:    Patient ID: [name], female    DOB: 1940-11-23, 75 y.o.   MRN: [id number]  HPI this patient presents to the office with chief complaint of an unattached big toenail, right foot. She states it started to become mildly painful yesterday and is scheduled for a trip, so she presents the office today for an evaluation. She says that she had her nails done at a salon and yesterday she did injure the nail and it popped up. She says it  is still attached and she applied a Band-Aid to keep it in place. She presents the office today for an evaluation and treatment of this nail. prior to her trip.  She denies any drainage    Review of Systems  All other systems reviewed and are negative.      Objective:   Physical Exam GENERAL APPEARANCE: Alert, conversant. Appropriately groomed. No acute distress.  VASCULAR: Pedal pulses are  palpable at  [facility] and PT bilateral.  Capillary refill time is immediate to all digits,  Normal temperature gradient.  NEUROLOGIC: sensation is normal to 5.07 monofilament at 5/5 sites bilateral.  Light touch is intact bilateral, Muscle strength normal.  MUSCULOSKELETAL: acceptable muscle strength, tone and stability bilateral.  Intrinsic muscluature intact bilateral. 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  No preulcerative lesions or ulcers  are seen, no interdigital maceration noted.  No open lesions present.  . No drainage noted.  NAIL  The nail plate is unattached to nail bed except medial aspect proximal nail fold right hallux. No redness or swelling noted.         Assessment & Plan:  Nail trauma right hallux  IE  Removal of nail.  No home soaks needed.  RTC prn  RTC prn   Gardiner Barefoot DPM

## 2015-11-29 ENCOUNTER — Encounter: Payer: Medicare Other | Admitting: Internal Medicine

## 2015-11-30 ENCOUNTER — Encounter: Payer: Self-pay | Admitting: Internal Medicine

## 2015-11-30 ENCOUNTER — Ambulatory Visit (INDEPENDENT_AMBULATORY_CARE_PROVIDER_SITE_OTHER): Payer: Medicare Other | Admitting: Internal Medicine

## 2015-11-30 VITALS — BP 110/70 | HR 58 | Temp 97.9°F | Resp 18 | Ht 66.5 in | Wt 170.4 lb

## 2015-11-30 DIAGNOSIS — Z86010 Personal history of colonic polyps: Secondary | ICD-10-CM

## 2015-11-30 DIAGNOSIS — E559 Vitamin D deficiency, unspecified: Secondary | ICD-10-CM

## 2015-11-30 DIAGNOSIS — Z0000 Encounter for general adult medical examination without abnormal findings: Secondary | ICD-10-CM

## 2015-11-30 DIAGNOSIS — E785 Hyperlipidemia, unspecified: Secondary | ICD-10-CM

## 2015-11-30 DIAGNOSIS — Z23 Encounter for immunization: Secondary | ICD-10-CM | POA: Diagnosis not present

## 2015-11-30 NOTE — Progress Notes (Signed)
Subjective:    Patient ID: [name], female    DOB: 04-21-1940, 75 y.o.   MRN: [id number]  HPI  75 year old patient who is seen today for a preventive health exam. She enjoys excellent health.  Does have a history of dyslipidemia, controlled with diet.  When last seen in the spring, she has significant reflux symptoms that have been controlled with dietary manipulation.  Doing quite well today  She does have a history colonic polyps and her last colonoscopy was 2015.  She states her brother has been diagnosed with colon cancer  Past Medical History:  Diagnosis Date  . COLONIC POLYPS, HX OF 10/31/2006  . DIVERTICULOSIS, COLON 10/21/2008  . ELEVATED BP READING WITHOUT DX HYPERTENSION 07/12/2009  . HYPERLIPIDEMIA 07/01/2006  . PALPITATIONS, OCCASIONAL 10/21/2008  . VERTIGO 07/25/2009     Social History   Social History  . Marital status: Married    Spouse name: N/A  . Number of children: N/A  . Years of education: N/A   Occupational History  . Not on file.   Social History Main Topics  . Smoking status: Never Smoker  . Smokeless tobacco: Never Used  . Alcohol use Yes     Comment: wine occ  . Drug use: No  . Sexual activity: Not on file   Other Topics Concern  . Not on file   Social History Narrative  . No narrative on file    Past Surgical History:  Procedure Laterality Date  . ABDOMINAL HYSTERECTOMY    . LAMINECTOMY     lumbar  . ROTATOR CUFF REPAIR      Family History  Problem Relation Age of Onset  . Diabetes Sister   . Diabetes Brother   . Colon cancer Neg Hx   . Pancreatic cancer Neg Hx   . Stomach cancer Neg Hx     No Known Allergies  Current Outpatient Prescriptions on File Prior to Visit  Medication Sig Dispense Refill  . desoximetasone (TOPICORT) 0.25 % cream Apply 1 application topically as needed.     Marland Kitchen escitalopram (LEXAPRO) 10 MG tablet Take 1 tablet (10 mg total) by mouth daily. (Patient not taking: Reported on 11/30/2015) 90  tablet 2  . omeprazole (PRILOSEC) 20 MG capsule Take 1 capsule (20 mg total) by mouth daily. (Patient not taking: Reported on 11/30/2015) 30 capsule 3   No current facility-administered medications on file prior to visit.     BP 110/70 (BP Location: Left Arm, Patient Position: Sitting, Cuff Size: Normal)   Pulse (!) 58   Temp 97.9 F (36.6 C) (Oral)   Resp 18   Ht 5' 6.5" (1.689 m)   Wt 170 lb 6.1 oz (77.3 kg)   SpO2 98%   BMI 27.09 kg/m   Medicare wellness  1. Risk factors, based on past  M,S,F history.  Cardiovascular risk factors include dyslipidemia, only  2.  Physical activities: Fairly active without activity restrictions  3.  Depression/mood: History mild situational stress.  Presently stable without medications  4.  Hearing: No deficits  5.  ADL's: Independent  6.  Fall risk: Low  7.  Home safety: No problems identified  8.  Height weight, and visual acuity; height and weight stable no change in visual acuity is followed by ophthalmology annually.    9.  Counseling: Continue heart healthy diet modest weight loss recommended  10. Lab orders based on risk factors: We'll check follow-up lipid profile  11. Referral : Mammogram in December  12. Care plan: We'll continue efforts at a aggressive risk factor modification.  Will need colonoscopies at five-year intervals  13. Cognitive assessment: Alert and oriented with normal affect no cognitive dysfunction  14. Screening: Patient provided with a written and personalized 5-10 year screening schedule in the AVS.    15. Provider List Update: Primary care ophthalmology gynecology and dermatology    Review of Systems  Constitutional: Negative.   HENT: Negative for congestion, dental problem, hearing loss, rhinorrhea, sinus pressure, sore throat and tinnitus.   Eyes: Negative for pain, discharge and visual disturbance.  Respiratory: Negative for cough and shortness of breath.   Cardiovascular: Negative for chest  pain, palpitations and leg swelling.  Gastrointestinal: Negative for abdominal distention, abdominal pain, blood in stool, constipation, diarrhea, nausea and vomiting.  Genitourinary: Negative for difficulty urinating, dysuria, flank pain, frequency, hematuria, pelvic pain, urgency, vaginal bleeding, vaginal discharge and vaginal pain.  Musculoskeletal: Negative for arthralgias, gait problem and joint swelling.  Skin: Negative for rash.  Neurological: Negative for dizziness, syncope, speech difficulty, weakness, numbness and headaches.  Hematological: Negative for adenopathy.  Psychiatric/Behavioral: Negative for agitation, behavioral problems and dysphoric mood. The patient is not nervous/anxious.        Objective:   Physical Exam  Constitutional: She is oriented to person, place, and time. She appears well-developed and well-nourished.  HENT:  Head: Normocephalic and atraumatic.  Right Ear: External ear normal.  Left Ear: External ear normal.  Mouth/Throat: Oropharynx is clear and moist.  Eyes: Conjunctivae and EOM are normal.  Neck: Normal range of motion. Neck supple. No JVD present. No thyromegaly present.  Cardiovascular: Normal rate, regular rhythm, normal heart sounds and intact distal pulses.   No murmur heard. Pulmonary/Chest: Effort normal and breath sounds normal. She has no wheezes. She has no rales.  Abdominal: Soft. Bowel sounds are normal. She exhibits no distension and no mass. There is no tenderness. There is no rebound and no guarding.  Musculoskeletal: Normal range of motion. She exhibits no edema or tenderness.  Neurological: She is alert and oriented to person, place, and time. She has normal reflexes. No cranial nerve deficit. She exhibits normal muscle tone. Coordination normal.  Skin: Skin is warm and dry. No rash noted.  Psychiatric: She has a normal mood and affect. Her behavior is normal.          Assessment & Plan:   Preventive health examination.   The patient has been scheduled to see OB/GYN.  Will get a mammogram in December. Is seen by dermatology and ophthalmology annually Dyslipidemia.  We'll continue heart healthy diet.  Review a lipid profile.  Modest weight loss encouraged GERD.  Well-controlled on diet  Continue vitamin D and calcium supplementation.  We'll check vitamin D level. Follow-up one year  Nyoka Cowden

## 2015-11-30 NOTE — Patient Instructions (Addendum)
It is important that you exercise regularly, at least 20 minutes 3 to 4 times per week.  If you develop chest pain or shortness of breath seek  medical attention.  Take a calcium supplement, plus [id number] units of vitamin D   Fat and Cholesterol Restricted Diet Getting too much fat and cholesterol in your diet may cause health problems. Following this diet helps keep your fat and cholesterol at normal levels. This can keep you from getting sick. WHAT TYPES OF FAT SHOULD I CHOOSE?  Choose monosaturated and polyunsaturated fats. These are found in foods such as olive oil, canola oil, flaxseeds, walnuts, almonds, and seeds.  Eat more omega-3 fats. Good choices include salmon, mackerel, sardines, tuna, flaxseed oil, and ground flaxseeds.  Limit saturated fats. These are in animal products such as meats, butter, and cream. They can also be in plant products such as palm oil, palm kernel oil, and coconut oil.   Avoid foods with partially hydrogenated oils in them. These contain trans fats. Examples of foods that have trans fats are stick margarine, some tub margarines, cookies, crackers, and other baked goods. WHAT GENERAL GUIDELINES DO I NEED TO FOLLOW?   Check food labels. Look for the words "trans fat" and "saturated fat."  When preparing a meal:  Fill half of your plate with vegetables and green salads.  Fill one fourth of your plate with whole grains. Look for the word "whole" as the first word in the ingredient list.  Fill one fourth of your plate with lean protein foods.  Limit fruit to two servings a day. Choose fruit instead of juice.  Eat more foods with soluble fiber. Examples of foods with this type of fiber are apples, broccoli, carrots, beans, peas, and barley. Try to get 20-30 g (grams) of fiber per day.  Eat more home-cooked foods. Eat less at restaurants and buffets.  Limit or avoid alcohol.  Limit foods high in starch and sugar.  Limit fried foods.  Cook foods  without frying them. Baking, boiling, grilling, and broiling are all great options.  Lose weight if you are overweight. Losing even a small amount of weight can help your overall health. It can also help prevent diseases such as diabetes and heart disease. WHAT FOODS CAN I EAT? Grains Whole grains, such as whole wheat or whole grain breads, crackers, cereals, and pasta. Unsweetened oatmeal, bulgur, barley, quinoa, or brown rice. Corn or whole wheat flour tortillas. Vegetables Fresh or frozen vegetables (raw, steamed, roasted, or grilled). Green salads. Fruits All fresh, canned (in natural juice), or frozen fruits. Meat and Other Protein Products Ground beef (85% or leaner), grass-fed beef, or beef trimmed of fat. Skinless chicken or [place]. Ground chicken or [place]. Pork trimmed of fat. All fish and seafood. Eggs. Dried beans, peas, or lentils. Unsalted nuts or seeds. Unsalted canned or dry beans. Dairy Low-fat dairy products, such as skim or 1% milk, 2% or reduced-fat cheeses, low-fat ricotta or cottage cheese, or plain low-fat yogurt. Fats and Oils Tub margarines without trans fats. Light or reduced-fat mayonnaise and salad dressings. Avocado. Olive, canola, sesame, or safflower oils. Natural peanut or almond butter (choose ones without added sugar and oil). The items listed above may not be a complete list of recommended foods or beverages. Contact your dietitian for more options. WHAT FOODS ARE NOT RECOMMENDED? Grains White bread. White pasta. White rice. Cornbread. Bagels, pastries, and croissants. Crackers that contain trans fat. Vegetables White potatoes. Corn. Creamed or fried vegetables. Vegetables in a  cheese sauce. Fruits Dried fruits. Canned fruit in light or heavy syrup. Fruit juice. Meat and Other Protein Products Fatty cuts of meat. Ribs, chicken wings, bacon, sausage, bologna, salami, chitterlings, fatback, hot dogs, bratwurst, and packaged luncheon meats. Liver and organ  meats. Dairy Whole or 2% milk, cream, half-and-half, and cream cheese. Whole milk cheeses. Whole-fat or sweetened yogurt. Full-fat cheeses. Nondairy creamers and whipped toppings. Processed cheese, cheese spreads, or cheese curds. Sweets and Desserts Corn syrup, sugars, honey, and molasses. Candy. Jam and jelly. Syrup. Sweetened cereals. Cookies, pies, cakes, donuts, muffins, and ice cream. Fats and Oils Butter, stick margarine, lard, shortening, ghee, or bacon fat. Coconut, palm kernel, or palm oils. Beverages Alcohol. Sweetened drinks (such as sodas, lemonade, and fruit drinks or punches). The items listed above may not be a complete list of foods and beverages to avoid. Contact your dietitian for more information.   This information is not intended to replace advice given to you by your health care provider. Make sure you discuss any questions you have with your health care provider.   Document Released: 07/09/2011 Document Revised: 01/28/2014 Document Reviewed: 04/08/2013 Elsevier Interactive Patient Education [employer].

## 2015-12-01 LAB — LIPID PANEL
CHOLESTEROL: 272 mg/dL — AB (ref 0–200)
HDL: 84.8 mg/dL (ref >39.00)
LDL Cholesterol: 170 mg/dL — ABNORMAL HIGH (ref 0–99)
NonHDL: 187.34
TRIGLYCERIDES: 86 mg/dL (ref 0.0–149.0)
Total CHOL/HDL Ratio: 3
VLDL: 17.2 mg/dL (ref 0.0–40.0)

## 2015-12-01 LAB — VITAMIN D 25 HYDROXY (VIT D DEFICIENCY, FRACTURES): VITD: 28.16 ng/mL — ABNORMAL LOW (ref 30.00–100.00)

## 2015-12-01 LAB — HEPATITIS C ANTIBODY: HCV Ab: NEGATIVE

## 2016-02-21 ENCOUNTER — Ambulatory Visit: Payer: Medicare Other | Admitting: Podiatry

## 2016-02-26 LAB — HM MAMMOGRAPHY

## 2016-02-29 ENCOUNTER — Telehealth: Payer: Self-pay | Admitting: Internal Medicine

## 2016-02-29 MED ORDER — ESCITALOPRAM OXALATE 10 MG PO TABS: 10.0000 mg | ORAL_TABLET | Freq: Every day | ORAL | 2 refills | Status: DC

## 2016-02-29 NOTE — Telephone Encounter (Signed)
See message below, please advise.

## 2016-02-29 NOTE — Telephone Encounter (Signed)
° ° °  Pt call to say that she no longer wants to take Lorazepam. She said she need a anti depressant and would like to take the below med   escitalopram (LEXAPRO) 10 MG tablet   [place]

## 2016-02-29 NOTE — Telephone Encounter (Signed)
Okay for new prescription for generic Lexapro 10 mg #60 one every morning Suggest follow-up office visit in 2 months

## 2016-02-29 NOTE — Telephone Encounter (Signed)
Detailed message left on voicemail that at new RX for generic Lexapro 10 mg #60 one every morning was called in to the pharmacy. Suggested a follow-up office visit in 2 months. Asked to call office back to make the appointment and if she has any questions.

## 2016-03-01 ENCOUNTER — Encounter: Payer: Self-pay | Admitting: Internal Medicine

## 2016-03-04 ENCOUNTER — Telehealth: Payer: Self-pay

## 2016-03-04 NOTE — Telephone Encounter (Signed)
Spoke with pt and she states that area has improved. Swelling is gone and wrist is not as painful. She has purchased a small wrist brace for stabilization and that has seemed to help also. She does not feel that she needs to be seen at this time. Advised pt to contact office if not continuing to improve. Pt voiced understanding. Nothing further needed at this time.    Patient Name: [name] Gender: Female DOB: 1940/12/20 Age: 76 Y 11 M 17 D Return Phone Number: [phone] (Primary) City/State/Zip: [place] Client [facility] Night - Client Client Site [facility] - Night Physician Simonne Martinet - MD Who Is Calling Patient / Member / Family / Caregiver Call Type Triage / Clinical Caller Name Reynalda Relationship To Patient Self Return Phone Number [phone] (Primary) Chief Complaint Hand or Wrist Injury Reason for Call Symptomatic / Request for [place] states her cysts have popped out on her wrist. Her right wrist and hand are swollen and Painful. Care Advice Given Per Guideline HOME CARE: You should be able to treat this at home. REASSURANCE - OVERUSE: Muscle strain and irritation are very common following: vigorous activity (e.g., throwing a ball, playing tennis), repetitive forceful motions (e.g., washing the car, scrubbing the floor), or heavy lifting. LOCAL COLD: * Apply a cold pack or an ice bag (wrapped in a moist towel) to the area for 20 minutes. Repeat in 1 hour, then every 4 hours while awake. * Continue this for the first 48 hours after an injury (Reason: to reduce the swelling and pain). LOCAL HEAT: * Beginning 48 hours after an injury, apply a warm washcloth or heating pad for 10 minutes three times a day. * This will help increase circulation and improve healing. PAIN MEDICINES: IBUPROFEN (E.G., MOTRIN, ADVIL): * Take 400 mg (two 200 mg pills) by mouth every 6 hours as needed. * Another  choice is to take 600 mg (three 200 mg pills) by mouth every 8 hours as needed. AVOID OVERUSE: You should try to avoid (or minimize) doing the activity that caused the pain for the next 1-2 weeks. EXPECTED COURSE: For minor injuries, pain should improve over a 2-3 day period and disappear within 7 days. CALL BACK IF: * Moderate pain (e.g., interferes with normal activities) lasts over 3 days * Mild pain lasts over 7 days * Signs of infection occur (e.g., spreading redness, warmth, fever) * You become worse. CARE ADVICE given per Hand and Wrist Pain (Adult) guideline.

## 2016-03-15 ENCOUNTER — Ambulatory Visit (INDEPENDENT_AMBULATORY_CARE_PROVIDER_SITE_OTHER)
Admission: RE | Admit: 2016-03-15 | Discharge: 2016-03-15 | Disposition: A | Discharge status: Home / Self Care | Payer: Medicare Other | Source: Ambulatory Visit | Attending: Family Medicine | Admitting: Family Medicine

## 2016-03-15 ENCOUNTER — Encounter: Payer: Self-pay | Admitting: Family Medicine

## 2016-03-15 ENCOUNTER — Ambulatory Visit (INDEPENDENT_AMBULATORY_CARE_PROVIDER_SITE_OTHER): Payer: Medicare Other | Admitting: Family Medicine

## 2016-03-15 VITALS — BP 128/84 | HR 104 | Temp 98.7°F | Wt 169.6 lb

## 2016-03-15 DIAGNOSIS — M25531 Pain in right wrist: Secondary | ICD-10-CM

## 2016-03-15 MED ORDER — PREDNISONE 10 MG PO TABS: ORAL_TABLET | ORAL | 0 refills | Status: DC

## 2016-03-15 NOTE — Patient Instructions (Signed)
We have ordered labs or studies at this visit. It can take up to 1-2 weeks for results and processing. IF results require follow up or explanation, we will call you with instructions. Clinically stable results will be released to your [facility]. If you have not heard from [place] or cannot find your results in [facility] in 2 weeks please contact our office at [phone].  If you are not yet signed up for [facility], please consider signing up   Please take prednisone as prescribed with food and follow up with your provider next week or sooner if needed after review of x-ray

## 2016-03-15 NOTE — Progress Notes (Signed)
Subjective:    Patient ID: [name], female    DOB: [birth year], 76 y.o.   MRN: [id number]  HPI  [name] is a 76 year old female who presents today with right wrist pain and swelling for 2 weeks.  She contacted team health regarding this pain and treated this symptomatically with ice and ibuprofen which provided benefit.  On 03/04/16, she reported that swelling had improved and wrist pain had decreased with treatment of a small wrist brace for stabilization and conservative treatment.   She reports this pain was triggered after doing pull-ups at the gym and she noticed swelling. She denies feeling a click, pop.  Associated pain is noted as a 7 at times and has worsened over the past 3 days.  She reports the pain as burning and aching. She has a history of a cyst on her right wrist.  She denies fever, night sweats, numbness, tingling, or weakness. Associated arthritic changes are present and she reports movements like opening a jar have become painful and difficult.  Patient appears anxious today and is adamant that she receive an X-ray for evaluation of this issue. She reports history of anxiety and states she will follow up with her provider for this issue.    Review of Systems  Constitutional: Negative for chills, fatigue and fever.  Respiratory: Negative for cough, shortness of breath and wheezing.   Cardiovascular: Negative for chest pain and palpitations.  Gastrointestinal: Negative for abdominal pain, diarrhea, nausea and vomiting.  Musculoskeletal: Negative for myalgias.       Right wrist swelling and pain  Skin: Negative for color change and rash.  Neurological: Negative for dizziness, light-headedness and headaches.  Psychiatric/Behavioral: The patient is nervous/anxious.    Past Medical History:  Diagnosis Date  . COLONIC POLYPS, HX OF 10/31/2006  . DIVERTICULOSIS, COLON 10/21/2008  . ELEVATED BP READING WITHOUT DX HYPERTENSION 07/12/2009  . HYPERLIPIDEMIA 07/01/2006    . PALPITATIONS, OCCASIONAL 10/21/2008  . VERTIGO 07/25/2009     Social History   Social History  . Marital status: Married    Spouse name: N/A  . Number of children: N/A  . Years of education: N/A   Occupational History  . Not on file.   Social History Main Topics  . Smoking status: Never Smoker  . Smokeless tobacco: Never Used  . Alcohol use Yes     Comment: wine occ  . Drug use: No  . Sexual activity: Not on file   Other Topics Concern  . Not on file   Social History Narrative  . No narrative on file    Past Surgical History:  Procedure Laterality Date  . ABDOMINAL HYSTERECTOMY    . LAMINECTOMY     lumbar  . ROTATOR CUFF REPAIR      Family History  Problem Relation Age of Onset  . Diabetes Sister   . Diabetes Brother   . Colon cancer Neg Hx   . Pancreatic cancer Neg Hx   . Stomach cancer Neg Hx     No Known Allergies  Current Outpatient Prescriptions on File Prior to Visit  Medication Sig Dispense Refill  . desoximetasone (TOPICORT) 0.25 % cream Apply 1 application topically as needed.     Marland Kitchen escitalopram (LEXAPRO) 10 MG tablet Take 1 tablet (10 mg total) by mouth daily. 90 tablet 2  . omeprazole (PRILOSEC) 20 MG capsule Take 1 capsule (20 mg total) by mouth daily. (Patient not taking: Reported on 11/30/2015) 30 capsule 3  No current facility-administered medications on file prior to visit.     BP 128/84 (BP Location: Left Arm, Patient Position: Sitting, Cuff Size: Normal)   Pulse (!) 104   Temp 98.7 F (37.1 C) (Oral)   Wt 169 lb 9.6 oz (76.9 kg)   SpO2 95%   BMI 26.96 kg/m        Objective:   Physical Exam  Constitutional: She is oriented to person, place, and time. She appears well-developed and well-nourished.  Eyes: Pupils are equal, round, and reactive to light. No scleral icterus.  Neck: Neck supple.  Cardiovascular: Normal rate and regular rhythm.   Pulmonary/Chest: Effort normal and breath sounds normal. She has no wheezes. She has  no rales.  Musculoskeletal:       Right wrist: She exhibits tenderness.  Diffuse, minimal edema generalized over the right wrist with a cystic swelling on the dorsum of the wrist. Cystic swelling is smooth, round, and rubbery. Generalized tenderness to palpation over wrist and hand. No erythema or pallor. DIP and PIP changes are present.  Lymphadenopathy:    She has no cervical adenopathy.  Neurological: She is alert and oriented to person, place, and time. Coordination normal.  Skin: Skin is warm and dry. No rash noted. No erythema.  Psychiatric:  Appears anxious      Assessment & Plan:   1. Right wrist pain Cystic swelling noted; suspect that pain is related to cystic swelling; DIP and PIP chronic changes are also present; recent trigger of pain after pull ups during workout support imaging for further evaluation. Short course of prednisone for inflammation provided and X-ray of wrist today. We discussed the importance of follow up with her provider to determine next steps after trial of conservative therapy for symptoms. Patient voiced understanding and is agreeable to plan.   - predniSONE (DELTASONE) 10 MG tablet; Take 4 tablets once daily for 2 days, 3 tabs daily for 2 days, 2 tabs daily for 2 days, 1 tab daily for 2 days.  Dispense: 20 tablet; Refill: 0 - DG Wrist Complete Right; Future  Delano Metz, FNP-C

## 2016-03-15 NOTE — Progress Notes (Signed)
Pre visit review using our clinic review tool, if applicable. No additional management support is needed unless otherwise documented below in the visit note. 

## 2016-03-18 ENCOUNTER — Telehealth: Payer: Self-pay | Admitting: Internal Medicine

## 2016-03-18 NOTE — Telephone Encounter (Signed)
Patient is already aware of the results.

## 2016-03-18 NOTE — Telephone Encounter (Signed)
Pt would like wrist xray results. Pt would like callback this morning. Pt saw julia this past friday

## 2016-03-21 ENCOUNTER — Encounter: Payer: Self-pay | Admitting: Internal Medicine

## 2016-03-21 ENCOUNTER — Ambulatory Visit (INDEPENDENT_AMBULATORY_CARE_PROVIDER_SITE_OTHER): Payer: Medicare Other | Admitting: Internal Medicine

## 2016-03-21 VITALS — BP 146/68 | HR 74 | Temp 98.4°F | Ht 66.5 in | Wt 170.0 lb

## 2016-03-21 DIAGNOSIS — M150 Primary generalized (osteo)arthritis: Secondary | ICD-10-CM

## 2016-03-21 DIAGNOSIS — M11231 Other chondrocalcinosis, right wrist: Secondary | ICD-10-CM | POA: Diagnosis not present

## 2016-03-21 DIAGNOSIS — M1990 Unspecified osteoarthritis, unspecified site: Secondary | ICD-10-CM | POA: Insufficient documentation

## 2016-03-21 DIAGNOSIS — M1380 Other specified arthritis, unspecified site: Secondary | ICD-10-CM | POA: Insufficient documentation

## 2016-03-21 DIAGNOSIS — M159 Polyosteoarthritis, unspecified: Secondary | ICD-10-CM

## 2016-03-21 NOTE — Patient Instructions (Addendum)
Calcium Pyrophosphate Deposition Calcium pyrophosphate deposition (CPPD), which is also called pseudogout, is a type of arthritis that causes pain, swelling, and inflammation in a joint. The joint pain can be severe and may last for days. If it is not treated, the pain may last much longer. Attacks of CPPD may come and go. This condition usually affects one joint at a time. The joints that are affected most commonly are the knees, but this condition can also affect the wrists, elbows, shoulders, or ankles. CPPD is similar to gout. Both conditions result from the buildup of crystals in the joint. However, CPPD is caused by a type of crystal that is different than the crystals that cause gout. What are the causes? This condition is caused by the buildup of calcium pyrophosphate dihydrate crystals in the joint. The reason why this buildup occurs is not known. The condition may be passed down from parent to child (hereditary). What increases the risk? This condition is more likely to develop in people who:  Are over 60 years old.  Have a family history of the condition.  Have had joint replacement surgery.  Have had a recent injury.  Have certain medical conditions, such as hemophilia, ochronosis, amyloidosis, or hormonal disorders.  Have low blood magnesium levels. What are the signs or symptoms? Symptoms of this condition include:  Pain in a joint. The pain may:  Be intense and constant.  Come on quickly.  Get worse with movement.  Last from several days to a few weeks.  Redness, swelling, and warmth at the joint.  Stiffness of the joint. How is this diagnosed? To diagnose this condition, your health care provider will use a needle to remove fluid from the joint. The fluid will be examined under a microscope to check for the crystals that cause CPPD. You may also have imaging tests, such as:  X-rays.  Ultrasound. How is this treated? There is no way to remove the crystals from  the joint and no way to cure this condition. However, treatment can relieve symptoms and improve joint function. Treatment may include:  Nonsteroidal anti-inflammatory drugs (NSAIDs) to reduce inflammation and pain.  Medicines to help prevent attacks.  Injections of medicine (cortisone) into the joint to reduce pain and swelling.  Physical therapy to improve joint function. Follow these instructions at home:  Take medicines only as directed by your health care provider.  Rest the affected joints until your symptoms start to go away.  Keep your affected joints raised (elevated) when possible. This will help to reduce swelling.  If directed, apply ice to the affected area:  Put ice in a plastic bag.  Place a towel between your skin and the bag.  Leave the ice on for 20 minutes, 2-3 times per day.  If the painful joint is in your leg, use crutches as directed by your health care provider.  When your symptoms start to go away, begin to exercise regularly or do physical therapy. Talk with your health care provider or physical therapist about what types of exercise are safe for you. Low-impact exercise may be best. This includes walking, swimming, bicycling, and water aerobics.  Maintain a healthy weight so your joints do not need to bear more weight than necessary. Contact a health care provider if:  You have an increase in joint pain that is not relieved with medicine.  Your joint becomes more red, swollen, or stiff.  You have a fever.  You have a skin rash. This information is   not intended to replace advice given to you by your health care provider. Make sure you discuss any questions you have with your health care provider. Document Released: 09/30/2003 Document Revised: 06/15/2015 Document Reviewed: 12/15/2013 Elsevier Interactive Patient Education  2017 Else[employer] Inc[employer]  

## 2016-03-21 NOTE — Progress Notes (Signed)
Pre visit review using our clinic review tool, if applicable. No additional management support is needed unless otherwise documented below in the visit note. 

## 2016-03-21 NOTE — Progress Notes (Signed)
   Subjective:    Patient ID: [name], female    DOB: 06-06-1940, 76 y.o.   MRN: [id number]  HPI  76 year old patient who presents complaining of pain and swelling involving the right wrist.  Symptoms have been present for about 3 weeks.  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 The patient was prescribed prednisone but was reluctant to take this medication She does have a history of ankle pain several years ago  Past Medical History:  Diagnosis Date  . COLONIC POLYPS, HX OF 10/31/2006  . DIVERTICULOSIS, COLON 10/21/2008  . ELEVATED BP READING WITHOUT DX HYPERTENSION 07/12/2009  . HYPERLIPIDEMIA 07/01/2006  . PALPITATIONS, OCCASIONAL 10/21/2008  . VERTIGO 07/25/2009     Social History   Social History  . Marital status: Married    Spouse name: N/A  . Number of children: N/A  . Years of education: N/A   Occupational History  . Not on file.   Social History Main Topics  . Smoking status: Never Smoker  . Smokeless tobacco: Never Used  . Alcohol use Yes     Comment: wine occ  . Drug use: No  . Sexual activity: Not on file   Other Topics Concern  . Not on file   Social History Narrative  . No narrative on file    Past Surgical History:  Procedure Laterality Date  . ABDOMINAL HYSTERECTOMY    . LAMINECTOMY     lumbar  . ROTATOR CUFF REPAIR      Family History  Problem Relation Age of Onset  . Diabetes Sister   . Diabetes Brother   . Colon cancer Neg Hx   . Pancreatic cancer Neg Hx   . Stomach cancer Neg Hx     No Known Allergies  Current Outpatient Prescriptions on File Prior to Visit  Medication Sig Dispense Refill  . desoximetasone (TOPICORT) 0.25 % cream Apply 1 application topically as needed.     Marland Kitchen escitalopram (LEXAPRO) 10 MG tablet Take 1 tablet (10 mg total) by mouth daily. 90 tablet 2  . omeprazole (PRILOSEC) 20 MG capsule Take 1 capsule (20 mg total)  by mouth daily. (Patient not taking: Reported on 11/30/2015) 30 capsule 3  . predniSONE (DELTASONE) 10 MG tablet Take 4 tablets once daily for 2 days, 3 tabs daily for 2 days, 2 tabs daily for 2 days, 1 tab daily for 2 days. 20 tablet 0   No current facility-administered medications on file prior to visit.     BP (!) 146/68 (BP Location: Left Arm, Patient Position: Sitting, Cuff Size: Normal)   Pulse 74   Temp 98.4 F (36.9 C) (Oral)   Ht 5' 6.5" (1.689 m)   Wt 170 lb (77.1 kg)   SpO2 98%   BMI 27.03 kg/m     Review of Systems  Constitutional: Negative.   Musculoskeletal: Positive for arthralgias and joint swelling.       Objective:   Physical Exam  Constitutional: She appears well-developed and well-nourished. No distress.  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  Patient does have a prescription for prednisone.  Her right wrist has intensified slightly and agreeable to take.  Patient will report any clinical worsening or if she fails to improve  Nyoka Cowden

## 2016-05-21 ENCOUNTER — Encounter: Payer: Self-pay | Admitting: Internal Medicine

## 2016-05-21 ENCOUNTER — Ambulatory Visit (INDEPENDENT_AMBULATORY_CARE_PROVIDER_SITE_OTHER): Payer: Medicare Other | Admitting: Internal Medicine

## 2016-05-21 VITALS — BP 118/76 | HR 67 | Temp 98.0°F | Ht 66.5 in | Wt 170.4 lb

## 2016-05-21 DIAGNOSIS — M159 Polyosteoarthritis, unspecified: Secondary | ICD-10-CM

## 2016-05-21 DIAGNOSIS — E785 Hyperlipidemia, unspecified: Secondary | ICD-10-CM

## 2016-05-21 DIAGNOSIS — R030 Elevated blood-pressure reading, without diagnosis of hypertension: Secondary | ICD-10-CM | POA: Diagnosis not present

## 2016-05-21 DIAGNOSIS — M150 Primary generalized (osteo)arthritis: Secondary | ICD-10-CM

## 2016-05-21 MED ORDER — ESCITALOPRAM OXALATE 10 MG PO TABS: 10.0000 mg | ORAL_TABLET | Freq: Every day | ORAL | 2 refills | Status: DC

## 2016-05-21 NOTE — Patient Instructions (Addendum)
It is important that you exercise regularly, at least 20 minutes 3 to 4 times per week.  If you develop chest pain or shortness of breath seek  medical attention.   Earwax Buildup Your ears make a substance called earwax. It may also be called cerumen. Sometimes, too much earwax builds up in your ear canal. This can cause ear pain and make it harder for you to hear. CAUSES This condition is caused by too much earwax production or buildup. RISK FACTORS The following factors may make you more likely to develop this condition:  Cleaning your ears often with swabs.  Having narrow ear canals.  Having earwax that is overly thick or sticky.  Having eczema.  Being dehydrated. SYMPTOMS Symptoms of this condition include:  Reduced hearing.  Ear drainage.  Ear pain.  Ear itch.  A feeling of fullness in the ear or feeling that the ear is plugged.  Ringing in the ear.  Coughing. DIAGNOSIS Your health care provider can diagnose this condition based on your symptoms and medical history. Your health care provider will also do an ear exam to look inside your ear with a scope (otoscope). You may also have a hearing test. TREATMENT Treatment for this condition includes:  Over-the-counter or prescription ear drops to soften the earwax.  Earwax removal by a health care provider. This may be done:  By flushing the ear with body-temperature water.  With a medical instrument that has a loop at the end (earwax curette).  With a suction device. HOME CARE INSTRUCTIONS  Take over-the-counter and prescription medicines only as told by your health care provider.  Do not put any objects, including an ear swab, into your ear. You can clean the opening of your ear canal with a washcloth.  Drink enough water to keep your urine clear or pale yellow.  If you have frequent earwax buildup or you use hearing aids, consider seeing your health care provider every 6-12 months for routine preventive  ear cleanings. Keep all follow-up visits as told by your health care provider. SEEK MEDICAL CARE IF:  You have ear pain.  Your condition does not improve with treatment.  You have hearing loss.  You have blood, pus, or other fluid coming from your ear. This information is not intended to replace advice given to you by your health care provider. Make sure you discuss any questions you have with your health care provider. Document Released: 02/15/2004 Document Revised: 05/01/2015 Document Reviewed: 08/24/2014 Elsevier Interactive Patient Education  2017 [employer].

## 2016-05-21 NOTE — Progress Notes (Signed)
Pre visit review using our clinic review tool, if applicable. No additional management support is needed unless otherwise documented below in the visit note. 

## 2016-05-21 NOTE — Progress Notes (Signed)
Subjective:    Patient ID: [name], female    DOB: 04-07-1940, 76 y.o.   MRN: [id number]  HPI  76 year old patient who is seen today in follow-up.  She was seen 2 months ago with pseudogout involving the wrist.  This responded nicely to burst of prednisone and has not reoccurred She has been on Lexapro for about 2 months.  She has had much less worry and less crying.  In general has done well on this medication She is complaining of diminished auditory acuity bilaterally  She is planning a vacation to [place] in the near future  Past Medical History:  Diagnosis Date  . COLONIC POLYPS, HX OF 10/31/2006  . DIVERTICULOSIS, COLON 10/21/2008  . ELEVATED BP READING WITHOUT DX HYPERTENSION 07/12/2009  . HYPERLIPIDEMIA 07/01/2006  . PALPITATIONS, OCCASIONAL 10/21/2008  . VERTIGO 07/25/2009     Social History   Social History  . Marital status: Married    Spouse name: N/A  . Number of children: N/A  . Years of education: N/A   Occupational History  . Not on file.   Social History Main Topics  . Smoking status: Never Smoker  . Smokeless tobacco: Never Used  . Alcohol use Yes     Comment: wine occ  . Drug use: No  . Sexual activity: Not on file   Other Topics Concern  . Not on file   Social History Narrative  . No narrative on file    Past Surgical History:  Procedure Laterality Date  . ABDOMINAL HYSTERECTOMY    . LAMINECTOMY     lumbar  . ROTATOR CUFF REPAIR      Family History  Problem Relation Age of Onset  . Diabetes Sister   . Diabetes Brother   . Colon cancer Neg Hx   . Pancreatic cancer Neg Hx   . Stomach cancer Neg Hx     No Known Allergies  Current Outpatient Prescriptions on File Prior to Visit  Medication Sig Dispense Refill  . desoximetasone (TOPICORT) 0.25 % cream Apply 1 application topically as needed.     Marland Kitchen escitalopram (LEXAPRO) 10 MG tablet Take 1 tablet (10 mg total) by mouth daily. 90 tablet 2   No current facility-administered  medications on file prior to visit.     BP 118/76 (BP Location: Left Arm, Patient Position: Sitting, Cuff Size: Normal)   Pulse 67   Temp 98 F (36.7 C) (Oral)   Ht 5' 6.5" (1.689 m)   Wt 170 lb 6.4 oz (77.3 kg)   SpO2 98%   BMI 27.09 kg/m     Review of Systems  Constitutional: Negative.   HENT: Positive for hearing loss. Negative for congestion, dental problem, rhinorrhea, sinus pressure, sore throat and tinnitus.   Eyes: Negative for pain, discharge and visual disturbance.  Respiratory: Negative for cough and shortness of breath.   Cardiovascular: Negative for chest pain, palpitations and leg swelling.  Gastrointestinal: Negative for abdominal distention, abdominal pain, blood in stool, constipation, diarrhea, nausea and vomiting.  Genitourinary: Negative for difficulty urinating, dysuria, flank pain, frequency, hematuria, pelvic pain, urgency, vaginal bleeding, vaginal discharge and vaginal pain.  Musculoskeletal: Negative for arthralgias, gait problem and joint swelling.  Skin: Negative for rash.  Neurological: Negative for dizziness, syncope, speech difficulty, weakness, numbness and headaches.  Hematological: Negative for adenopathy.  Psychiatric/Behavioral: Positive for dysphoric mood. Negative for agitation and behavioral problems. The patient is nervous/anxious.        Objective:   Physical Exam  Constitutional: She appears well-developed and well-nourished. No distress.  Blood pressure low normal  HENT:  Cerumen impactions  Psychiatric: She has a normal mood and affect. Her behavior is normal. Judgment and thought content normal.          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  Both canals irrigated until clear  Follow-up 6 months or as needed  [employer]

## 2016-10-10 ENCOUNTER — Encounter: Payer: Self-pay | Admitting: Internal Medicine

## 2016-12-26 ENCOUNTER — Telehealth: Payer: Self-pay | Admitting: Family Medicine

## 2016-12-26 NOTE — Telephone Encounter (Signed)
duplicate

## 2017-02-18 ENCOUNTER — Encounter: Payer: Medicare Other | Admitting: Internal Medicine

## 2017-03-03 ENCOUNTER — Encounter: Payer: Medicare Other | Admitting: Internal Medicine

## 2017-03-04 ENCOUNTER — Ambulatory Visit (INDEPENDENT_AMBULATORY_CARE_PROVIDER_SITE_OTHER): Payer: Medicare Other | Admitting: Internal Medicine

## 2017-03-04 ENCOUNTER — Encounter: Payer: Self-pay | Admitting: Internal Medicine

## 2017-03-04 VITALS — BP 150/70 | HR 70 | Temp 98.1°F | Ht 67.5 in | Wt 175.8 lb

## 2017-03-04 DIAGNOSIS — M21611 Bunion of right foot: Secondary | ICD-10-CM

## 2017-03-04 DIAGNOSIS — M150 Primary generalized (osteo)arthritis: Secondary | ICD-10-CM | POA: Diagnosis not present

## 2017-03-04 DIAGNOSIS — E785 Hyperlipidemia, unspecified: Secondary | ICD-10-CM

## 2017-03-04 DIAGNOSIS — Z0000 Encounter for general adult medical examination without abnormal findings: Secondary | ICD-10-CM

## 2017-03-04 DIAGNOSIS — Z86010 Personal history of colonic polyps: Secondary | ICD-10-CM | POA: Diagnosis not present

## 2017-03-04 DIAGNOSIS — M159 Polyosteoarthritis, unspecified: Secondary | ICD-10-CM

## 2017-03-04 DIAGNOSIS — M21612 Bunion of left foot: Secondary | ICD-10-CM

## 2017-03-04 LAB — COMPREHENSIVE METABOLIC PANEL
ALBUMIN: 4.1 g/dL (ref 3.5–5.2)
ALK PHOS: 67 U/L (ref 39–117)
ALT: 18 U/L (ref 0–35)
AST: 15 U/L (ref 0–37)
BUN: 15 mg/dL (ref 6–23)
CALCIUM: 8.9 mg/dL (ref 8.4–10.5)
CHLORIDE: 104 meq/L (ref 96–112)
CO2: 32 mEq/L (ref 19–32)
CREATININE: 0.61 mg/dL (ref 0.40–1.20)
GFR: 101.64 mL/min (ref >60.00)
Glucose, Bld: 84 mg/dL (ref 70–99)
POTASSIUM: 4.1 meq/L (ref 3.5–5.1)
SODIUM: 143 meq/L (ref 135–145)
TOTAL PROTEIN: 7 g/dL (ref 6.0–8.3)
Total Bilirubin: 0.5 mg/dL (ref 0.2–1.2)

## 2017-03-04 LAB — CBC WITH DIFFERENTIAL/PLATELET
Basophils Absolute: 0.1 10*3/uL (ref 0.0–0.1)
Basophils Relative: 1.1 % (ref 0.0–3.0)
EOS ABS: 0.2 10*3/uL (ref 0.0–0.7)
EOS PCT: 2.8 % (ref 0.0–5.0)
HEMATOCRIT: 40.2 % (ref 36.0–46.0)
HEMOGLOBIN: 13.5 g/dL (ref 12.0–15.0)
LYMPHS PCT: 17.6 % (ref 12.0–46.0)
Lymphs Abs: 1 10*3/uL (ref 0.7–4.0)
MCHC: 33.6 g/dL (ref 30.0–36.0)
MCV: 92.2 fl (ref 78.0–100.0)
MONO ABS: 0.4 10*3/uL (ref 0.1–1.0)
Monocytes Relative: 7.2 % (ref 3.0–12.0)
Neutro Abs: 4 10*3/uL (ref 1.4–7.7)
Neutrophils Relative %: 71.3 % (ref 43.0–77.0)
Platelets: 311 10*3/uL (ref 150.0–400.0)
RBC: 4.36 Mil/uL (ref 3.87–5.11)
RDW: 14.7 % (ref 11.5–15.5)
WBC: 5.5 10*3/uL (ref 4.0–10.5)

## 2017-03-04 LAB — TSH: TSH: 3.82 u[IU]/mL (ref 0.35–4.50)

## 2017-03-04 LAB — LIPID PANEL
Cholesterol: 268 mg/dL — ABNORMAL HIGH (ref 0–200)
HDL: 75.5 mg/dL (ref >39.00)
LDL CALC: 176 mg/dL — AB (ref 0–99)
NonHDL: 192.65
TRIGLYCERIDES: 84 mg/dL (ref 0.0–149.0)
Total CHOL/HDL Ratio: 4
VLDL: 16.8 mg/dL (ref 0.0–40.0)

## 2017-03-04 NOTE — Patient Instructions (Signed)
Limit your sodium (Salt) intake  Take a calcium supplement, plus [phone] units of vitamin D    It is important that you exercise regularly, at least 20 minutes 3 to 4 times per week.  If you develop chest pain or shortness of breath seek  medical attention.  Schedule your mammogram.  Podiatry consult as scheduled

## 2017-03-04 NOTE — Progress Notes (Signed)
Subjective:    Patient ID: [name], female    DOB: 11-03-1940, 77 y.o.   MRN: [id number]  HPI 77 year old patient who is seen today for a annual health assessment and subsequent Medicare wellness visit She has been on Lexapro in the past which has been self discontinued after a taper.  She was on this medication is due to a adjustment disorder with situational stress and depressed mood.  She presently has done well off the medication. 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  She has a mammogram scheduled and eye examination later today.  Her last colonoscopy was 2015. No new concerns or complaints.  She does have a history of colonic polyps.  We will consider a final colonoscopy in 1 year  Past Medical History:  Diagnosis Date  . COLONIC POLYPS, HX OF 10/31/2006  . DIVERTICULOSIS, COLON 10/21/2008  . ELEVATED BP READING WITHOUT DX HYPERTENSION 07/12/2009  . HYPERLIPIDEMIA 07/01/2006  . PALPITATIONS, OCCASIONAL 10/21/2008  . VERTIGO 07/25/2009     Social History   Socioeconomic History  . Marital status: Married    Spouse name: Not on file  . Number of children: Not on file  . Years of education: Not on file  . Highest education level: Not on file  Social Needs  . Financial resource strain: Not on file  . Food insecurity - worry: Not on file  . Food insecurity - inability: Not on file  . Transportation needs - medical: Not on file  . Transportation needs - non-medical: Not on file  Occupational History  . Not on file  Tobacco Use  . Smoking status: Never Smoker  . Smokeless tobacco: Never Used  Substance and Sexual Activity  . Alcohol use: Yes    Comment: wine occ  . Drug use: No  . Sexual activity: Not on file  Other Topics Concern  . Not on file  Social History Narrative  . Not on file    Past Surgical History:  Procedure Laterality Date  . ABDOMINAL HYSTERECTOMY    . LAMINECTOMY     lumbar  .  ROTATOR CUFF REPAIR      Family History  Problem Relation Age of Onset  . Diabetes Sister   . Diabetes Brother   . Colon cancer Neg Hx   . Pancreatic cancer Neg Hx   . Stomach cancer Neg Hx     No Known Allergies  Current Outpatient Medications on File Prior to Visit  Medication Sig Dispense Refill  . desoximetasone (TOPICORT) 0.25 % cream Apply 1 application topically as needed.      No current facility-administered medications on file prior to visit.     BP (!) 150/70 (BP Location: Right Arm, Patient Position: Sitting, Cuff Size: Normal)   Pulse 70   Temp 98.1 F (36.7 C) (Oral)   Ht 5' 7.5" (1.715 m)   Wt 175 lb 12.8 oz (79.7 kg)   SpO2 97%   BMI 27.13 kg/m    Subsequent Medicare wellness visit   1. Risk factors, based on past  M,S,F history.  Cardiovascular risk factors include dyslipidemia, only  2.  Physical activities: Fairly active without activity restrictions.  Does go to a health club frequently  3.  Depression/mood: History mild situational stress.  Presently stable without medications.  Has been on Lexapro in the past which has been tapered and discontinued  4.  Hearing: No deficits  5.  ADL's:  Independent  6.  Fall risk: Low  7.  Home safety: No problems identified  8.  Height weight, and visual acuity; height and weight stable no change in visual acuity is followed by ophthalmology annually.    Patient has had some mild weight gain with Lexapro therapy but this has been tapered and discontinued  9.  Counseling: Continue heart healthy diet modest weight loss recommended  10. Lab orders based on risk factors: We'll check follow-up lipid profile  11. Referral : Mammogram has been scheduled.  Patient is scheduled for Mohs surgery tomorrow.  Patient has painful bunions and will refer for podiatry evaluation  12. Care plan: We'll continue efforts at a aggressive risk factor modification.  Will need colonoscopies at 75-year intervals;  personal history of colonic polyps as well as a brother with colon cancer  56. Cognitive assessment: Alert and oriented with normal affect no cognitive dysfunction  14. Screening: Patient provided with a written and personalized 75-10 year screening schedule in the AVS.    15. Provider List Update: Primary care ophthalmology gynecology and dermatology as well as GI and podiatry     Review of Systems  Constitutional: Negative.   HENT: Negative for congestion, dental problem, hearing loss, rhinorrhea, sinus pressure, sore throat and tinnitus.   Eyes: Negative for pain, discharge and visual disturbance.  Respiratory: Negative for cough and shortness of breath.   Cardiovascular: Negative for chest pain, palpitations and leg swelling.  Gastrointestinal: Negative for abdominal distention, abdominal pain, blood in stool, constipation, diarrhea, nausea and vomiting.  Genitourinary: Negative for difficulty urinating, dysuria, flank pain, frequency, hematuria, pelvic pain, urgency, vaginal bleeding, vaginal discharge and vaginal pain.  Musculoskeletal: Positive for arthralgias and gait problem. Negative for joint swelling.  Skin: Negative for rash.  Neurological: Negative for dizziness, syncope, speech difficulty, weakness, numbness and headaches.  Hematological: Negative for adenopathy.  Psychiatric/Behavioral: Negative for agitation, behavioral problems and dysphoric mood. The patient is not nervous/anxious.        Objective:   Physical Exam  Constitutional: She is oriented to person, place, and time. She appears well-developed and well-nourished.  HENT:  Head: Normocephalic and atraumatic.  Right Ear: External ear normal.  Left Ear: External ear normal.  Mouth/Throat: Oropharynx is clear and moist.  Eyes: Conjunctivae and EOM are normal.  Neck: Normal range of motion. Neck supple. No JVD present. No thyromegaly present.  Cardiovascular: Normal rate, regular rhythm, normal heart sounds  and intact distal pulses.  No murmur heard. Pulmonary/Chest: Effort normal and breath sounds normal. She has no wheezes. She has no rales.  Abdominal: Soft. Bowel sounds are normal. She exhibits no distension and no mass. There is no tenderness. There is no rebound and no guarding.  Musculoskeletal: Normal range of motion. She exhibits no edema or tenderness.  Bunion deformities as well as some toe deformities and angulation  Neurological: She is alert and oriented to person, place, and time. She has normal reflexes. No cranial nerve deficit. She exhibits normal muscle tone. Coordination normal.  Skin: Skin is warm and dry. No rash noted.  Psychiatric: She has a normal mood and affect. Her behavior is normal.          Assessment & Plan:   Preventive health examination Subsequent Medicare wellness exam Symptomatic bunions.  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  Mohs surgery and scheduled  Mammogram scheduled Follow-up colonoscopy one year Eye examination as scheduled later today  Continue annual  dermatologic evaluation  Nyoka Cowden

## 2017-03-05 ENCOUNTER — Telehealth: Payer: Self-pay | Admitting: Internal Medicine

## 2017-03-05 DIAGNOSIS — Z85828 Personal history of other malignant neoplasm of skin: Secondary | ICD-10-CM | POA: Insufficient documentation

## 2017-03-05 NOTE — Telephone Encounter (Signed)
Copied from [place]. Topic: Quick Communication - See Telephone Encounter >> Mar 05, 2017  4:50 PM Ivar Drape wrote: CRM for notification. See Telephone encounter for:  03/05/17. Patient would like the results of her last labs.

## 2017-03-07 NOTE — Telephone Encounter (Signed)
Pt notified of results/instructions and verbalized understanding. 

## 2017-03-19 ENCOUNTER — Ambulatory Visit: Payer: Self-pay

## 2017-03-19 ENCOUNTER — Ambulatory Visit: Payer: Medicare Other | Admitting: Podiatry

## 2017-03-19 ENCOUNTER — Ambulatory Visit (INDEPENDENT_AMBULATORY_CARE_PROVIDER_SITE_OTHER): Payer: Medicare Other

## 2017-03-19 DIAGNOSIS — M779 Enthesopathy, unspecified: Secondary | ICD-10-CM

## 2017-03-19 DIAGNOSIS — M2012 Hallux valgus (acquired), left foot: Secondary | ICD-10-CM | POA: Diagnosis not present

## 2017-03-19 DIAGNOSIS — L84 Corns and callosities: Secondary | ICD-10-CM

## 2017-03-19 DIAGNOSIS — M2011 Hallux valgus (acquired), right foot: Secondary | ICD-10-CM

## 2017-03-19 DIAGNOSIS — M2042 Other hammer toe(s) (acquired), left foot: Secondary | ICD-10-CM

## 2017-03-19 DIAGNOSIS — M2010 Hallux valgus (acquired), unspecified foot: Secondary | ICD-10-CM

## 2017-03-19 MED ORDER — TRIAMCINOLONE ACETONIDE 10 MG/ML IJ SUSP
10.0000 mg | Freq: Once | INTRAMUSCULAR | Status: AC
  Administered 2017-03-19: 10 mg

## 2017-03-19 NOTE — Progress Notes (Signed)
Subjective:   Patient ID: [name], female   DOB: 77 y.o.   MRN: [id number]   HPI Patient presents stating had a lot of problems with the second toe of her left foot and I know I have bunion and hammertoe deformity of his right hand for years.  Patient does not smoke and likes to be active and states she has trouble with certain types of shoe gear   Review of Systems  All other systems reviewed and are negative.       Objective:  Physical Exam  Constitutional: She appears well-developed and well-nourished.  Cardiovascular: Intact distal pulses.  Pulmonary/Chest: Effort normal.  Musculoskeletal: Normal range of motion.  Neurological: She is alert.  Skin: Skin is warm.  Nursing note and vitals reviewed.   Neurovascular status found to be intact muscle strength is adequate range of motion within normal limits with patient noted to have structural bunion deformity left over right with deviation of the hallux against the second toe with rigid contracture digit to left with inflammation and fluid of the interphalangeal joint.  There is also keratotic lesion formation that is painful when palpated and patient has good digital perfusion and is well oriented x3     Assessment:  Acute inflammation second digit left with structural deformity of both feet     Plan:  H&P x-rays reviewed conditions discussed.  At this time are to try conservative and I did a proximal nerve block of the left forefoot and then with sterile technique I injected the interphalangeal joint 2 mg dexamethasone Kenalog use sterile instrumentation to deep debridement of lesion applied padding and instructed to be seen back when symptomatic.  Explained ultimately this may require bunion correction along with digital fusion  X-rays indicate that the patient has significant structural bunion deformity and hammertoe deformity

## 2017-03-19 NOTE — Progress Notes (Signed)
   Subjective:    Patient ID: [name], female    DOB: 07-Sep-1940, 77 y.o.   MRN: [id number]  HPI    Review of Systems  All other systems reviewed and are negative.      Objective:   Physical Exam        Assessment & Plan:

## 2017-03-19 NOTE — Patient Instructions (Signed)

## 2017-07-21 ENCOUNTER — Ambulatory Visit: Payer: Medicare Other | Admitting: Podiatry

## 2017-07-21 ENCOUNTER — Other Ambulatory Visit: Payer: Self-pay | Admitting: Podiatry

## 2017-07-21 ENCOUNTER — Ambulatory Visit (INDEPENDENT_AMBULATORY_CARE_PROVIDER_SITE_OTHER): Payer: Medicare Other

## 2017-07-21 ENCOUNTER — Encounter: Payer: Self-pay | Admitting: Podiatry

## 2017-07-21 DIAGNOSIS — L84 Corns and callosities: Secondary | ICD-10-CM

## 2017-07-21 DIAGNOSIS — M2012 Hallux valgus (acquired), left foot: Secondary | ICD-10-CM

## 2017-07-21 DIAGNOSIS — M79671 Pain in right foot: Secondary | ICD-10-CM

## 2017-07-21 DIAGNOSIS — M2042 Other hammer toe(s) (acquired), left foot: Secondary | ICD-10-CM

## 2017-07-21 DIAGNOSIS — M2011 Hallux valgus (acquired), right foot: Secondary | ICD-10-CM

## 2017-07-21 DIAGNOSIS — M779 Enthesopathy, unspecified: Secondary | ICD-10-CM

## 2017-07-21 DIAGNOSIS — M79672 Pain in left foot: Principal | ICD-10-CM

## 2017-07-21 MED ORDER — TRIAMCINOLONE ACETONIDE 10 MG/ML IJ SUSP
10.0000 mg | Freq: Once | INTRAMUSCULAR | Status: AC
  Administered 2017-07-21: 10 mg

## 2017-07-21 NOTE — Patient Instructions (Signed)

## 2017-07-22 NOTE — Progress Notes (Signed)
Subjective:   Patient ID: [name], female   DOB: 77 y.o.   MRN: [id number]   HPI Patient presents with significant callus formation bilateral pain in the medial side of the right arch and pain in the left plantar heel with digital deformity second toe left with elongated elevated second toe left is very painful when palpated.  Patient does not smoke likes to be active   Review of Systems  All other systems reviewed and are negative.       Objective:  Physical Exam  Constitutional: She appears well-developed and well-nourished.  Cardiovascular: Intact distal pulses.  Pulmonary/Chest: Effort normal.  Musculoskeletal: Normal range of motion.  Neurological: She is alert.  Skin: Skin is warm.  Nursing note and vitals reviewed.   Neurovascular status intact muscle strength is adequate range of motion within normal limits with patient found to have severely elevated second digit left with significant structural bunion with history of correction of the right foot around 20 years ago.  There is inflammation on the medial side of the right ankle that is very tender and there is also mild discomfort in the plantar left heel.     Assessment:  Moderate structural bunion deformity with digital deformity bilateral keratotic lesions plantar and pain at the PIP posterior tibial insertion into the navicular right H&P all conditions reviewed and discussed and careful injection today administered 3 mg Kenalog 5 Milgram Xylocaine right around the posterior tibial tendon     Plan:  And instructed on reduced activity.  I then reviewed possibility for surgical intervention at one point in future but today debridement was accomplished and we will see how she responds to good aggressive debridement techniques  X-rays indicate there is structural malalignment bilateral with elevation of the digits and moderate bunion deformity

## 2017-09-29 ENCOUNTER — Ambulatory Visit: Payer: Medicare Other | Admitting: Podiatry

## 2017-10-16 ENCOUNTER — Telehealth: Payer: Self-pay | Admitting: Internal Medicine

## 2017-10-16 NOTE — Telephone Encounter (Signed)
Copied from [place] [id number]. Topic: Inquiry >> Oct 16, 2017  9:43 AM Conception Chancy, NT wrote: Reason for CRM: [name] is calling from [employer] and states she has a cancer policy with them and they need to confirm that she has had cancer. Please contact.  Dates between July 2015-July 2018  Cb# [phone] Claim # [id number]

## 2017-10-17 NOTE — Telephone Encounter (Signed)
Spoke to [employer] at [employer] and informed her that pt has not been dx with any cancer prior to the basal cell carcinoma. No further action is needed!

## 2017-10-17 NOTE — Telephone Encounter (Signed)
Pt given okay verbal to release information to [employer].

## 2018-03-04 ENCOUNTER — Encounter

## 2018-03-04 ENCOUNTER — Ambulatory Visit (INDEPENDENT_AMBULATORY_CARE_PROVIDER_SITE_OTHER): Payer: Medicare Other

## 2018-03-04 ENCOUNTER — Ambulatory Visit: Payer: Medicare Other | Admitting: Podiatry

## 2018-03-04 ENCOUNTER — Other Ambulatory Visit: Payer: Self-pay | Admitting: Podiatry

## 2018-03-04 ENCOUNTER — Encounter: Payer: Self-pay | Admitting: Podiatry

## 2018-03-04 DIAGNOSIS — M79672 Pain in left foot: Secondary | ICD-10-CM

## 2018-03-04 DIAGNOSIS — M84375A Stress fracture, left foot, initial encounter for fracture: Secondary | ICD-10-CM

## 2018-03-04 NOTE — Progress Notes (Signed)
Subjective:   Patient ID: [name], female   DOB: 78 y.o.   MRN: [id number]   HPI Patient presents stating she is developed severe pain in the plantar aspect of the left heel and is not sure what happened but stated she was walking very quickly and all of a sudden the pain hit her intensely.  Patient states it is also swelling and feels warm   ROS      Objective:  Physical Exam  Neurovascular status intact negative Homans sign noted with exquisite discomfort in the left plantar heel lateral and medial side of the heel bone itself left that is very tender when pressed     Assessment:  Inflammatory condition left with strong probability for stress fracture     Plan:  H&P condition reviewed and I recommended immobilization with boot aggressive ice therapy reduced activity and reappoint 3 weeks or earlier if needed  X-ray was inconclusive as to whether there is stress fracture with patient and structural changes and mild plantar spur formation

## 2018-03-05 ENCOUNTER — Ambulatory Visit: Payer: Medicare Other | Admitting: Podiatry

## 2018-03-20 ENCOUNTER — Ambulatory Visit: Payer: Medicare Other | Admitting: Podiatry

## 2018-03-20 ENCOUNTER — Encounter: Payer: Self-pay | Admitting: Podiatry

## 2018-03-20 DIAGNOSIS — N816 Rectocele: Secondary | ICD-10-CM | POA: Insufficient documentation

## 2018-03-20 DIAGNOSIS — M722 Plantar fascial fibromatosis: Secondary | ICD-10-CM

## 2018-03-20 DIAGNOSIS — M84375A Stress fracture, left foot, initial encounter for fracture: Secondary | ICD-10-CM

## 2018-03-20 MED ORDER — TRIAMCINOLONE ACETONIDE 10 MG/ML IJ SUSP
10.0000 mg | Freq: Once | INTRAMUSCULAR | Status: AC
  Administered 2018-03-20: 10 mg

## 2018-03-21 ENCOUNTER — Encounter: Payer: Self-pay | Admitting: Internal Medicine

## 2018-03-21 NOTE — Progress Notes (Signed)
Subjective:   Patient ID: [name], female   DOB: 78 y.o.   MRN: [id number]   HPI Patient presents stating that her right heel seems some better than it was but it continues to have a sharp spot on the bottom   ROS      Objective:  Physical Exam  Neurovascular status intact with area of inflammation plantar aspect left heel at the insertional point tendon calcaneus     Assessment:  Acute plantar fasciitis left improved but still known     Plan:  Reviewed the continuation of conservative treatment and I did go ahead today did sterile prep medial side injected the plantar fascia 3 mg Kenalog 5 mg Xylocaine and instructed on continued boot usage with gradual reduced boot over the next few weeks

## 2018-03-25 ENCOUNTER — Telehealth: Payer: Self-pay | Admitting: Internal Medicine

## 2018-03-25 NOTE — Telephone Encounter (Signed)
Copied from [place] [phone]. Topic: General - Other >> Mar 25, 2018  3:15 PM Ivar Drape wrote: Reason for CRM:  Patient was a Dr. Burnice Logan patient and the doctor has retired.  She wants to know if Dr. Eilleen Kempf will consider taking her on as a new patient.  She is looking for an internist and was referred to Dr. Quay Burow by Elvin So "Teenie" [place]. She stated she is in very good health and doesn't go to the doctor much.  She just does her annuals.  Please advise.

## 2018-03-26 NOTE — Telephone Encounter (Signed)
Yes, I will accept her 

## 2018-03-26 NOTE — Telephone Encounter (Signed)
appt scheduled

## 2018-04-10 ENCOUNTER — Ambulatory Visit: Payer: Medicare Other | Admitting: Podiatry

## 2018-05-06 ENCOUNTER — Encounter: Payer: Self-pay | Admitting: Internal Medicine

## 2018-05-11 ENCOUNTER — Encounter: Payer: Medicare Other | Admitting: Internal Medicine

## 2018-07-19 NOTE — Patient Instructions (Addendum)
It was nice to meet you.  Monitor your BP at home - your goal is less than 140/90.    Tests ordered today. Your results will be released to [facility] (or called to you) after review, usually within 72hours after test completion. If any changes need to be made, you will be notified at that same time.  All other Health Maintenance issues reviewed.   All recommended immunizations and age-appropriate screenings are up-to-date or discussed.  No immunizations administered today.   Medications reviewed and updated.  Changes include :   none   Please followup in one year, sooner if needed.    Health Maintenance, Female Adopting a healthy lifestyle and getting preventive care are important in promoting health and wellness. Ask your health care provider about:  The right schedule for you to have regular tests and exams.  Things you can do on your own to prevent diseases and keep yourself healthy. What should I know about diet, weight, and exercise? Eat a healthy diet   Eat a diet that includes plenty of vegetables, fruits, low-fat dairy products, and lean protein.  Do not eat a lot of foods that are high in solid fats, added sugars, or sodium. Maintain a healthy weight Body mass index (BMI) is used to identify weight problems. It estimates body fat based on height and weight. Your health care provider can help determine your BMI and help you achieve or maintain a healthy weight. Get regular exercise Get regular exercise. This is one of the most important things you can do for your health. Most adults should:  Exercise for at least 150 minutes each week. The exercise should increase your heart rate and make you sweat (moderate-intensity exercise).  Do strengthening exercises at least twice a week. This is in addition to the moderate-intensity exercise.  Spend less time sitting. Even light physical activity can be beneficial. Watch cholesterol and blood lipids Have your blood tested for  lipids and cholesterol at 76 years of age, then have this test every 5 years. Have your cholesterol levels checked more often if:  Your lipid or cholesterol levels are high.  You are older than 76 years of age.  You are at high risk for heart disease. What should I know about cancer screening? Depending on your health history and family history, you may need to have cancer screening at various ages. This may include screening for:  Breast cancer.  Cervical cancer.  Colorectal cancer.  Skin cancer.  Lung cancer. What should I know about heart disease, diabetes, and high blood pressure? Blood pressure and heart disease  High blood pressure causes heart disease and increases the risk of stroke. This is more likely to develop in people who have high blood pressure readings, are of African descent, or are overweight.  Have your blood pressure checked: ? Every 3-5 years if you are 52-109 years of age. ? Every year if you are 64 years old or older. Diabetes Have regular diabetes screenings. This checks your fasting blood sugar level. Have the screening done:  Once every three years after age 15 if you are at a normal weight and have a low risk for diabetes.  More often and at a younger age if you are overweight or have a high risk for diabetes. What should I know about preventing infection? Hepatitis B If you have a higher risk for hepatitis B, you should be screened for this virus. Talk with your health care provider to find out if you  are at risk for hepatitis B infection. Hepatitis C Testing is recommended for:  Everyone born from [birth year] through 1963.  Anyone with known risk factors for hepatitis C. Sexually transmitted infections (STIs)  Get screened for STIs, including gonorrhea and chlamydia, if: ? You are sexually active and are younger than 76 years of age. ? You are older than 76 years of age and your health care provider tells you that you are at risk for this type of  infection. ? Your sexual activity has changed since you were last screened, and you are at increased risk for chlamydia or gonorrhea. Ask your health care provider if you are at risk.  Ask your health care provider about whether you are at high risk for HIV. Your health care provider may recommend a prescription medicine to help prevent HIV infection. If you choose to take medicine to prevent HIV, you should first get tested for HIV. You should then be tested every 3 months for as long as you are taking the medicine. Pregnancy  If you are about to stop having your period (premenopausal) and you may become pregnant, seek counseling before you get pregnant.  Take 400 to 800 micrograms (mcg) of folic acid every day if you become pregnant.  Ask for birth control (contraception) if you want to prevent pregnancy. Osteoporosis and menopause Osteoporosis is a disease in which the bones lose minerals and strength with aging. This can result in bone fractures. If you are 1 years old or older, or if you are at risk for osteoporosis and fractures, ask your health care provider if you should:  Be screened for bone loss.  Take a calcium or vitamin D supplement to lower your risk of fractures.  Be given hormone replacement therapy (HRT) to treat symptoms of menopause. Follow these instructions at home: Lifestyle  Do not use any products that contain nicotine or tobacco, such as cigarettes, e-cigarettes, and chewing tobacco. If you need help quitting, ask your health care provider.  Do not use street drugs.  Do not share needles.  Ask your health care provider for help if you need support or information about quitting drugs. Alcohol use  Do not drink alcohol if: ? Your health care provider tells you not to drink. ? You are pregnant, may be pregnant, or are planning to become pregnant.  If you drink alcohol: ? Limit how much you use to 0-1 drink a day. ? Limit intake if you are breastfeeding.   Be aware of how much alcohol is in your drink. In the [place] one drink equals one 12 oz bottle of beer (355 mL), one 5 oz glass of wine (148 mL), or one 1 oz glass of hard liquor (44 mL). General instructions  Schedule regular health, dental, and eye exams.  Stay current with your vaccines.  Tell your health care provider if: ? You often feel depressed. ? You have ever been abused or do not feel safe at home. Summary  Adopting a healthy lifestyle and getting preventive care are important in promoting health and wellness.  Follow your health care provider's instructions about healthy diet, exercising, and getting tested or screened for diseases.  Follow your health care provider's instructions on monitoring your cholesterol and blood pressure. This information is not intended to replace advice given to you by your health care provider. Make sure you discuss any questions you have with your health care provider. Document Released: 07/23/2010 Document Revised: 12/31/2017 Document Reviewed: 12/31/2017 Elsevier Patient Education  2020 Elsevier Inc.  

## 2018-07-19 NOTE — Progress Notes (Signed)
Subjective:    Patient ID: [name], female    DOB: [birth year], 78 y.o.   MRN: [id number]  HPI  She is here to establish with a new pcp.  She is here for a physical exam.   She has some questions regarding supplements and how much calcium and vitamin D she should be taking.  She wonders if she should be taking zinc.  She has had elevated blood pressure in the past, but does not monitor her blood pressure on a regular basis.  Overall she feels well and has no major concerns.  Medications and allergies reviewed with patient and updated if appropriate.  Patient Active Problem List   Diagnosis Date Noted  . Family history of diabetes mellitus (DM) 07/20/2018  . 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 Personal history of other malignant neoplasm of skin 03/05/2017  . 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 Osteoarthritis 03/21/2016  . VERTIGO 07/25/2009  . ELEVATED BP READING WITHOUT DX HYPERTENSION 07/12/2009  . DIVERTICULOSIS, COLON 10/21/2008  . PALPITATIONS, OCCASIONAL 10/21/2008  . History of colonic polyps 10/31/2006  . Dyslipidemia 07/01/2006    Current Outpatient Medications on File Prior to Visit  Medication Sig Dispense Refill  . calcium-vitamin D (OSCAL WITH D) 500-200 MG-UNIT tablet Calcium 500 + D (D3)  1 po qd otc     No current facility-administered medications on file prior to visit.     Past Medical History:  Diagnosis Date  . COLONIC POLYPS, HX OF 10/31/2006  . DIVERTICULOSIS, COLON 10/21/2008  . ELEVATED BP READING WITHOUT DX HYPERTENSION 07/12/2009  . HYPERLIPIDEMIA 07/01/2006  . PALPITATIONS, OCCASIONAL 10/21/2008  . VERTIGO 07/25/2009    Past Surgical History:  Procedure Laterality Date  . ABDOMINAL HYSTERECTOMY    . LAMINECTOMY     lumbar  . ROTATOR CUFF REPAIR      Social History   Socioeconomic History  . Marital status: Married    Spouse name: Not on file  . Number of children: Not on file  . Years of education:  Not on file  . Highest education level: Not on file  Occupational History  . Not on file  Social Needs  . Financial resource strain: Not on file  . Food insecurity    Worry: Not on file    Inability: Not on file  . Transportation needs    Medical: Not on file    Non-medical: Not on file  Tobacco Use  . Smoking status: Never Smoker  . Smokeless tobacco: Never Used  Substance and Sexual Activity  . Alcohol use: Yes    Comment: wine occ  . Drug use: No  . Sexual activity: Not on file  Lifestyle  . Physical activity    Days per week: Not on file    Minutes per session: Not on file  . Stress: Not on file  Relationships  . Social Herbalist on phone: Not on file    Gets together: Not on file    Attends religious service: Not on file    Active member of club or organization: Not on file    Attends meetings of clubs or organizations: Not on file    Relationship status: Not on file  Other Topics Concern  . Not on file  Social History Narrative  . Not on file    Family History  Problem Relation Age of Onset  . Diabetes Sister   . Diabetes Brother   .  Melanoma Brother   . Diabetes Mother   . COPD Mother   . Hypertension Father   . Colon cancer Neg Hx   . Pancreatic cancer Neg Hx   . Stomach cancer Neg Hx     Review of Systems  Constitutional: Negative for chills, fatigue and fever.  Eyes: Negative for visual disturbance.  Respiratory: Negative for cough, shortness of breath and wheezing.   Cardiovascular: Positive for palpitations (occasional) and leg swelling (rare - left ankle). Negative for chest pain.  Gastrointestinal: Negative for abdominal pain, blood in stool, constipation, diarrhea and nausea.       No gerd  Genitourinary: Negative for dysuria and hematuria.  Musculoskeletal: Positive for arthralgias (hands - does not take anything). Negative for back pain.  Skin: Negative for color change and rash.  Neurological: Negative for light-headedness and  headaches.  Psychiatric/Behavioral: Negative for dysphoric mood. The patient is nervous/anxious (controlled).        Objective:   Vitals:   07/20/18 0744  BP: (!) 164/82  Pulse: (!) 57  Resp: 16  Temp: 98.4 F (36.9 C)  SpO2: 98%   Filed Weights   07/20/18 0744  Weight: 173 lb 12.8 oz (78.8 kg)   Body mass index is 26.82 kg/m.  BP Readings from Last 3 Encounters:  07/20/18 (!) 164/82  03/04/17 (!) 150/70  05/21/16 118/76    Wt Readings from Last 3 Encounters:  07/20/18 173 lb 12.8 oz (78.8 kg)  03/04/17 175 lb 12.8 oz (79.7 kg)  05/21/16 170 lb 6.4 oz (77.3 kg)     Physical Exam Constitutional: She appears well-developed and well-nourished. No distress.  HENT:  Head: Normocephalic and atraumatic.  Right Ear: External ear normal. Normal ear canal and TM Left Ear: External ear normal.  Normal ear canal and TM Mouth/Throat: Oropharynx is clear and moist.  Eyes: Conjunctivae and EOM are normal.  Neck: Neck supple. No tracheal deviation present. No thyromegaly present. No carotid bruit  Cardiovascular: Normal rate, regular rhythm and normal heart sounds.  No murmur heard.  No edema. Pulmonary/Chest: Effort normal and breath sounds normal. No respiratory distress. She has no wheezes. She has no rales.  Breast: deferred   Abdominal: Soft. She exhibits no distension. There is no tenderness.  Lymphadenopathy: She has no cervical adenopathy.  Skin: Skin is warm and dry. She is not diaphoretic.  Psychiatric: She has a normal mood and affect. Her behavior is normal.        Assessment & Plan:   Physical exam: Screening blood work ordered Immunizations discussed shingles vaccine, others up-to-date Colonoscopy   past due-due 02/2018 Mammogram   last mammogram 02/2016 - scheduled Gyn  N/A - no longer seeing [facility]   last done 11/2014 - referral ordered - will schedule Eye exams   Up to date  Exercise  Yard work, chair stretching video on tv Weight   weight is good for  age Skin   Sees derm annually Substance abuse    none  See Problem List for Assessment and Plan of chronic medical problems.   FU in one year

## 2018-07-20 ENCOUNTER — Other Ambulatory Visit: Payer: Self-pay

## 2018-07-20 ENCOUNTER — Encounter: Payer: Self-pay | Admitting: Internal Medicine

## 2018-07-20 ENCOUNTER — Other Ambulatory Visit (INDEPENDENT_AMBULATORY_CARE_PROVIDER_SITE_OTHER): Payer: Medicare Other

## 2018-07-20 ENCOUNTER — Ambulatory Visit (INDEPENDENT_AMBULATORY_CARE_PROVIDER_SITE_OTHER): Payer: Medicare Other | Admitting: Internal Medicine

## 2018-07-20 VITALS — BP 164/82 | HR 57 | Temp 98.4°F | Resp 16 | Ht 67.5 in | Wt 173.8 lb

## 2018-07-20 DIAGNOSIS — Z0000 Encounter for general adult medical examination without abnormal findings: Secondary | ICD-10-CM

## 2018-07-20 DIAGNOSIS — E785 Hyperlipidemia, unspecified: Secondary | ICD-10-CM

## 2018-07-20 DIAGNOSIS — R030 Elevated blood-pressure reading, without diagnosis of hypertension: Secondary | ICD-10-CM

## 2018-07-20 DIAGNOSIS — Z833 Family history of diabetes mellitus: Secondary | ICD-10-CM

## 2018-07-20 DIAGNOSIS — Z85828 Personal history of other malignant neoplasm of skin: Secondary | ICD-10-CM

## 2018-07-20 DIAGNOSIS — Z13820 Encounter for screening for osteoporosis: Secondary | ICD-10-CM

## 2018-07-20 LAB — CBC WITH DIFFERENTIAL/PLATELET
Basophils Absolute: 0.1 10*3/uL (ref 0.0–0.1)
Basophils Relative: 2.3 % (ref 0.0–3.0)
Eosinophils Absolute: 0.2 10*3/uL (ref 0.0–0.7)
Eosinophils Relative: 4.5 % (ref 0.0–5.0)
HCT: 38.7 % (ref 36.0–46.0)
Hemoglobin: 12.9 g/dL (ref 12.0–15.0)
Lymphocytes Relative: 27.3 % (ref 12.0–46.0)
Lymphs Abs: 1.1 10*3/uL (ref 0.7–4.0)
MCHC: 33.4 g/dL (ref 30.0–36.0)
MCV: 93.6 fl (ref 78.0–100.0)
Monocytes Absolute: 0.4 10*3/uL (ref 0.1–1.0)
Monocytes Relative: 10.6 % (ref 3.0–12.0)
Neutro Abs: 2.2 10*3/uL (ref 1.4–7.7)
Neutrophils Relative %: 55.3 % (ref 43.0–77.0)
Platelets: 290 10*3/uL (ref 150.0–400.0)
RBC: 4.14 Mil/uL (ref 3.87–5.11)
RDW: 15.3 % (ref 11.5–15.5)
WBC: 4 10*3/uL (ref 4.0–10.5)

## 2018-07-20 LAB — COMPREHENSIVE METABOLIC PANEL
ALT: 16 U/L (ref 0–35)
AST: 16 U/L (ref 0–37)
Albumin: 4 g/dL (ref 3.5–5.2)
Alkaline Phosphatase: 53 U/L (ref 39–117)
BUN: 16 mg/dL (ref 6–23)
CO2: 25 mEq/L (ref 19–32)
Calcium: 8.9 mg/dL (ref 8.4–10.5)
Chloride: 106 mEq/L (ref 96–112)
Creatinine, Ser: 0.63 mg/dL (ref 0.40–1.20)
GFR: 91.79 mL/min (ref >60.00)
Glucose, Bld: 71 mg/dL (ref 70–99)
Potassium: 4.3 mEq/L (ref 3.5–5.1)
Sodium: 142 mEq/L (ref 135–145)
Total Bilirubin: 0.6 mg/dL (ref 0.2–1.2)
Total Protein: 7.3 g/dL (ref 6.0–8.3)

## 2018-07-20 LAB — LIPID PANEL
Cholesterol: 265 mg/dL — ABNORMAL HIGH (ref 0–200)
HDL: 95.4 mg/dL (ref >39.00)
LDL Cholesterol: 158 mg/dL — ABNORMAL HIGH (ref 0–99)
NonHDL: 170.07
Total CHOL/HDL Ratio: 3
Triglycerides: 62 mg/dL (ref 0.0–149.0)
VLDL: 12.4 mg/dL (ref 0.0–40.0)

## 2018-07-20 LAB — HEMOGLOBIN A1C: Hgb A1c MFr Bld: 5.4 % (ref 4.6–6.5)

## 2018-07-20 LAB — TSH: TSH: 3.72 u[IU]/mL (ref 0.35–4.50)

## 2018-07-20 NOTE — Assessment & Plan Note (Signed)
Blood pressure elevated here today and has not been elevated on occasion ?  Controlled or not She will buy a blood pressure cuff and start monitoring her blood pressure at home-discussed goal of less than 140/90 She will update me with her blood pressure results Discussed low-sodium diet, regular exercise and she also plans on working on weight loss

## 2018-07-20 NOTE — Assessment & Plan Note (Signed)
Family history of diabetes We will check A1c Low sugar/carbohydrate diet and regular exercise encouraged

## 2018-07-20 NOTE — Assessment & Plan Note (Addendum)
Sees dermatology annually Mohs surgery - BCC in past

## 2018-07-20 NOTE — Assessment & Plan Note (Addendum)
Controlling with diet Check lipid panel, tsh, cmp Regular exercise and healthy diet encouraged

## 2018-07-21 ENCOUNTER — Encounter: Payer: Self-pay | Admitting: Internal Medicine

## 2018-07-22 ENCOUNTER — Encounter: Payer: Self-pay | Admitting: Internal Medicine

## 2018-07-22 DIAGNOSIS — I10 Essential (primary) hypertension: Secondary | ICD-10-CM

## 2018-07-22 DIAGNOSIS — E785 Hyperlipidemia, unspecified: Secondary | ICD-10-CM

## 2018-07-23 DIAGNOSIS — I10 Essential (primary) hypertension: Secondary | ICD-10-CM | POA: Insufficient documentation

## 2018-07-23 MED ORDER — LISINOPRIL 5 MG PO TABS: 5.0000 mg | ORAL_TABLET | Freq: Every day | ORAL | 5 refills | Status: DC

## 2018-07-23 MED ORDER — ROSUVASTATIN CALCIUM 5 MG PO TABS: 5.0000 mg | ORAL_TABLET | ORAL | 5 refills | Status: DC

## 2018-09-02 ENCOUNTER — Ambulatory Visit: Payer: Medicare Other | Admitting: *Deleted

## 2018-09-02 ENCOUNTER — Other Ambulatory Visit: Payer: Self-pay

## 2018-09-02 VITALS — Ht 67.0 in | Wt 161.0 lb

## 2018-09-02 DIAGNOSIS — Z86010 Personal history of colonic polyps: Secondary | ICD-10-CM

## 2018-09-02 MED ORDER — NA SULFATE-K SULFATE-MG SULF 17.5-3.13-1.6 GM/177ML PO SOLN: ORAL | 0 refills | Status: DC

## 2018-09-02 NOTE — Progress Notes (Signed)
Patient's pre-visit was done today over the phone with the patient due to COVID-19 pandemic. Name,DOB and address verified. Insurance verified. Packet of Prep instructions mailed to patient including copy of a consent form and pre-procedure patient acknowledgement form-pt is aware. Patient understands to call us back with any questions or concerns. Patient denies any allergies to eggs or soy. Patient denies any problems with anesthesia/sedation. Patient denies any oxygen use at home. Patient denies taking any diet/weight loss medications or blood thinners. EMMI education assisgned to patient on colonoscopy, this was explained and instructions given to patient.Pt is aware that care partner will wait in the car during procedure; if they feel like they will be too hot to wait in the car; they may wait in the lobby.  We want them to wear a mask (we do not have any that we can provide them), practice social distancing, and we will check their temperatures when they get here.  I did remind patient that their care partner needs to stay in the parking lot the entire time. Pt will wear mask into building.

## 2018-09-11 ENCOUNTER — Encounter: Payer: Self-pay | Admitting: Internal Medicine

## 2018-09-14 ENCOUNTER — Telehealth: Payer: Self-pay

## 2018-09-14 NOTE — Telephone Encounter (Signed)
Covid-19 screening questions   Do you now or have you had a fever in the last 14 days? NO   Do you have any respiratory symptoms of shortness of breath or cough now or in the last 14 days? NO  Do you have any family members or close contacts with diagnosed or suspected Covid-19 in the past 14 days? NO  Have you been tested for Covid-19 and found to be positive? NO        

## 2018-09-15 ENCOUNTER — Ambulatory Visit (AMBULATORY_SURGERY_CENTER): Payer: Medicare Other | Admitting: Internal Medicine

## 2018-09-15 ENCOUNTER — Other Ambulatory Visit: Payer: Self-pay

## 2018-09-15 ENCOUNTER — Encounter: Payer: Self-pay | Admitting: Internal Medicine

## 2018-09-15 VITALS — BP 131/71 | HR 61 | Temp 97.7°F | Resp 21 | Ht 67.5 in | Wt 173.0 lb

## 2018-09-15 DIAGNOSIS — Z86010 Personal history of colonic polyps: Secondary | ICD-10-CM | POA: Diagnosis not present

## 2018-09-15 MED ORDER — SODIUM CHLORIDE 0.9 % IV SOLN: 500.0000 mL | Freq: Once | INTRAVENOUS | Status: DC

## 2018-09-15 NOTE — Progress Notes (Signed)
PT taken to PACU. Monitors in place. VSS. Report given to RN. 

## 2018-09-15 NOTE — Patient Instructions (Signed)
YOU HAD AN ENDOSCOPIC PROCEDURE TODAY AT [facility]:   Refer to the procedure report that was given to you for any specific questions about what was found during the examination.  If the procedure report does not answer your questions, please call your gastroenterologist to clarify.  If you requested that your care partner not be given the details of your procedure findings, then the procedure report has been included in a sealed envelope for you to review at your convenience later.  YOU SHOULD EXPECT: Some feelings of bloating in the abdomen. Passage of more gas than usual.  Walking can help get rid of the air that was put into your GI tract during the procedure and reduce the bloating. If you had a lower endoscopy (such as a colonoscopy or flexible sigmoidoscopy) you may notice spotting of blood in your stool or on the toilet paper. If you underwent a bowel prep for your procedure, you may not have a normal bowel movement for a few days.  Please Note:  You might notice some irritation and congestion in your nose or some drainage.  This is from the oxygen used during your procedure.  There is no need for concern and it should clear up in a day or so.  SYMPTOMS TO REPORT IMMEDIATELY:   Following lower endoscopy (colonoscopy or flexible sigmoidoscopy):  Excessive amounts of blood in the stool  Significant tenderness or worsening of abdominal pains  Swelling of the abdomen that is new, acute  Fever of 100F or higher  For urgent or emergent issues, a gastroenterologist can be reached at any hour by calling [phone].  DIET:  We do recommend a small meal at first, but then you may proceed to your regular diet.  Drink plenty of fluids but you should avoid alcoholic beverages for 24 hours.  ACTIVITY:  You should plan to take it easy for the rest of today and you should NOT DRIVE or use heavy machinery until tomorrow (because of the sedation medicines used during the test).     FOLLOW UP: Our staff will call the number listed on your records 48-72 hours following your procedure to check on you and address any questions or concerns that you may have regarding the information given to you following your procedure. If we do not reach you, we will leave a message.  We will attempt to reach you two times.  During this call, we will ask if you have developed any symptoms of COVID 19. If you develop any symptoms (ie: fever, flu-like symptoms, shortness of breath, cough etc.) before then, please call [phone].  If you test positive for Covid 19 in the 2 weeks post procedure, please call and report this information to [place].    SIGNATURES/CONFIDENTIALITY: You and/or your care partner have signed paperwork which will be entered into your electronic medical record.  These signatures attest to the fact that that the information above on your After Visit Summary has been reviewed and is understood.  Full responsibility of the confidentiality of this discharge information lies with you and/or your care-partner.  Continue your normal medications  Please read over handouts about diverticulosis and hemorrhoids  No need for further colonoscopy unless you have any issues

## 2018-09-15 NOTE — Op Note (Signed)
[facility] Patient Name: [name] Procedure Date: 09/15/2018 9:16 AM MRN: [id number] Endoscopist: Docia Chuck. Henrene Pastor , MD Age: 78 Referring MD:  Date of Birth: Jul 01, 1940 Gender: Female Account #: [id number] Procedure:                Colonoscopy Indications:              High risk colon cancer surveillance: Personal                            history of non-advanced adenoma, High risk colon                            cancer surveillance: Personal history of sessile                            serrated colon polyp (less than 10 mm in size) with                            no dysplasia. Previous examinations 2004                            [facility], hyperplastic polyp); 2009 (tubular                            adenoma); 2015 (sessile serrated polyp without                            dysplasia). Medicines:                Monitored Anesthesia Care Procedure:                Pre-Anesthesia Assessment:                           - Prior to the procedure, a History and Physical                            was performed, and patient medications and                            allergies were reviewed. The patient's tolerance of                            previous anesthesia was also reviewed. The risks                            and benefits of the procedure and the sedation                            options and risks were discussed with the patient.                            All questions were answered, and informed consent  was obtained. Prior Anticoagulants: The patient has                            taken no previous anticoagulant or antiplatelet                            agents. ASA Grade Assessment: II - A patient with                            mild systemic disease. After reviewing the risks                            and benefits, the patient was deemed in                            satisfactory condition to undergo the procedure.             After obtaining informed consent, the colonoscope                            was passed under direct vision. Throughout the                            procedure, the patient's blood pressure, pulse, and                            oxygen saturations were monitored continuously. The                            Colonoscope was introduced through the anus and                            advanced to the the cecum, identified by                            appendiceal orifice and ileocecal valve. The                            ileocecal valve, appendiceal orifice, and rectum                            were photographed. The quality of the bowel                            preparation was excellent. The colonoscopy was                            performed without difficulty. The patient tolerated                            the procedure well. The bowel preparation used was                            SUPREP via split dose instruction. Scope In: 9:22:03  AM Scope Out: 9:38:38 AM Scope Withdrawal Time: 0 hours 8 minutes 8 seconds  Total Procedure Duration: 0 hours 16 minutes 35 seconds  Findings:                 Multiple diverticula were found in the sigmoid                            colon.                           Internal hemorrhoids were found during retroflexion.                           The exam was otherwise without abnormality on                            direct and retroflexion views. Complications:            No immediate complications. Estimated blood loss:                            None. Estimated Blood Loss:     Estimated blood loss: none. Impression:               - Diverticulosis in the sigmoid colon.                           - Internal hemorrhoids.                           - The examination was otherwise normal on direct                            and retroflexion views.                           - No specimens collected. Recommendation:           - Repeat colonoscopy  is not recommended for                            surveillance.                           - Patient has a contact number available for                            emergencies. The signs and symptoms of potential                            delayed complications were discussed with the                            patient. Return to normal activities tomorrow.                            Written discharge instructions were provided to the  patient.                           - Resume previous diet.                           - Continue present medications. Docia Chuck. Henrene Pastor, MD 09/15/2018 9:45:21 AM This report has been signed electronically.

## 2018-09-17 ENCOUNTER — Telehealth: Payer: Self-pay | Admitting: *Deleted

## 2018-09-17 NOTE — Telephone Encounter (Signed)
1. Have you developed a fever since your procedure? no  2.   Have you had an respiratory symptoms (SOB or cough) since your procedure? no  3.   Have you tested positive for COVID 19 since your procedure no  4.   Have you had any family members/close contacts diagnosed with the COVID 19 since your procedure?  no   If yes to any of these questions please route to Joylene John, RN and Alphonsa Gin, Therapist, sports.  Follow up Call-  Call back number 09/15/2018 09/15/2018  Post procedure Call Back phone  # [phone]  Permission to leave phone message Yes -  Some recent data might be hidden     Patient questions:  Do you have a fever, pain , or abdominal swelling? No. Pain Score  0 *  Have you tolerated food without any problems? Yes.    Have you been able to return to your normal activities? Yes.    Do you have any questions about your discharge instructions: Diet   No. Medications  No. Follow up visit  No.  Do you have questions or concerns about your Care? No.  Actions: * If pain score is 4 or above: No action needed, pain <4.

## 2018-11-20 ENCOUNTER — Other Ambulatory Visit: Payer: Self-pay

## 2018-11-20 DIAGNOSIS — Z20822 Contact with and (suspected) exposure to covid-19: Secondary | ICD-10-CM

## 2018-11-22 LAB — NOVEL CORONAVIRUS, NAA: SARS-CoV-2, NAA: NOT DETECTED

## 2018-11-23 ENCOUNTER — Encounter: Payer: Self-pay | Admitting: Internal Medicine

## 2018-11-30 ENCOUNTER — Ambulatory Visit: Payer: Medicare Other | Admitting: Internal Medicine

## 2018-11-30 ENCOUNTER — Other Ambulatory Visit: Payer: Self-pay

## 2018-11-30 DIAGNOSIS — Z20822 Contact with and (suspected) exposure to covid-19: Secondary | ICD-10-CM

## 2018-11-30 NOTE — Telephone Encounter (Signed)
Appointment scheduled for pt. °

## 2018-11-30 NOTE — Progress Notes (Signed)
Virtual Visit via Video Note  I connected with [name] on 12/01/18 at 10:15 AM EST by a video enabled telemedicine application and verified that I am speaking with the correct person using two identifiers.   I discussed the limitations of evaluation and management by telemedicine and the availability of in person appointments. The patient expressed understanding and agreed to proceed.  Present for the visit:  Myself, Dr Billey Gosling, Miguel Aschoff.  The patient is currently at home and I am in the office.    No referring provider.    History of Present Illness: This is an acute visit for anxiety  She is a history of anxiety and was on medication in the past.  She states she does worry about her family-her kids and her grandkids.  They are all doing well, but she still worries about them.  Recently a family issue happened and she took it personally.  Her sons have noticed that she has increased anxiety and is concerned that she needs help with that.  She has had meltdowns every 2-3 months.  She thinks the election was also contributing to increased anxiety-her anxiety has improved since the election is over.  She denies any true depression, but has some sadness and maybe some mild dysthymia.  She is sleeping okay. She has not had any anxiety attacks and denies any chest pain, palpitations or shortness of breath.  She does have a name of a therapist she can call to talk to, but she is unsure if she wants to pay out-of-pocket for this.  Review of Systems  Constitutional: Negative for fever.  Respiratory: Negative for shortness of breath.   Cardiovascular: Negative for chest pain and palpitations.  Psychiatric/Behavioral: Positive for depression (probably, sadness, dysthymia). The patient is nervous/anxious. The patient does not have insomnia.      Social History   Socioeconomic History  . Marital status: Married    Spouse name: Not on file  . Number of children: Not on file   . Years of education: Not on file  . Highest education level: Not on file  Occupational History  . Not on file  Social Needs  . Financial resource strain: Not on file  . Food insecurity    Worry: Not on file    Inability: Not on file  . Transportation needs    Medical: Not on file    Non-medical: Not on file  Tobacco Use  . Smoking status: Never Smoker  . Smokeless tobacco: Never Used  Substance and Sexual Activity  . Alcohol use: Yes    Comment: wine occ  . Drug use: No  . Sexual activity: Not on file  Lifestyle  . Physical activity    Days per week: Not on file    Minutes per session: Not on file  . Stress: Not on file  Relationships  . Social Herbalist on phone: Not on file    Gets together: Not on file    Attends religious service: Not on file    Active member of club or organization: Not on file    Attends meetings of clubs or organizations: Not on file    Relationship status: Not on file  Other Topics Concern  . Not on file  Social History Narrative  . Not on file     Observations/Objective: Appears well in NAD Breathing normally Skin appears warm and dry Mood is slightly anxious.  Does not appear to be depressed  Assessment  and Plan:  See Problem List for Assessment and Plan of chronic medical problems.   Follow Up Instructions:    I discussed the assessment and treatment plan with the patient. The patient was provided an opportunity to ask questions and all were answered. The patient agreed with the plan and demonstrated an understanding of the instructions.   The patient was advised to call back or seek an in-person evaluation if the symptoms worsen or if the condition fails to improve as anticipated.    Binnie Rail, MD

## 2018-11-30 NOTE — Progress Notes (Deleted)
Virtual Visit via Video Note  I connected with [name] on 11/30/18 at  2:30 PM EST by a video enabled telemedicine application and verified that I am speaking with the correct person using two identifiers.   I discussed the limitations of evaluation and management by telemedicine and the availability of in person appointments. The patient expressed understanding and agreed to proceed.  Present for the visit:  Myself, Dr Billey Gosling, [name].  The patient is currently at home and I am in the office.    No referring provider.    History of Present Illness: This is an acute visit for anxiety      Social History   Socioeconomic History  . Marital status: Married    Spouse name: Not on file  . Number of children: Not on file  . Years of education: Not on file  . Highest education level: Not on file  Occupational History  . Not on file  Social Needs  . Financial resource strain: Not on file  . Food insecurity    Worry: Not on file    Inability: Not on file  . Transportation needs    Medical: Not on file    Non-medical: Not on file  Tobacco Use  . Smoking status: Never Smoker  . Smokeless tobacco: Never Used  Substance and Sexual Activity  . Alcohol use: Yes    Comment: wine occ  . Drug use: No  . Sexual activity: Not on file  Lifestyle  . Physical activity    Days per week: Not on file    Minutes per session: Not on file  . Stress: Not on file  Relationships  . Social Herbalist on phone: Not on file    Gets together: Not on file    Attends religious service: Not on file    Active member of club or organization: Not on file    Attends meetings of clubs or organizations: Not on file    Relationship status: Not on file  Other Topics Concern  . Not on file  Social History Narrative  . Not on file     Observations/Objective: Appears well in NAD   Assessment and Plan:  See Problem List for Assessment and Plan of chronic medical  problems.   Follow Up Instructions:    I discussed the assessment and treatment plan with the patient. The patient was provided an opportunity to ask questions and all were answered. The patient agreed with the plan and demonstrated an understanding of the instructions.   The patient was advised to call back or seek an in-person evaluation if the symptoms worsen or if the condition fails to improve as anticipated.    Binnie Rail, MD

## 2018-12-01 ENCOUNTER — Encounter: Payer: Self-pay | Admitting: Internal Medicine

## 2018-12-01 ENCOUNTER — Ambulatory Visit (INDEPENDENT_AMBULATORY_CARE_PROVIDER_SITE_OTHER): Payer: Medicare Other | Admitting: Internal Medicine

## 2018-12-01 DIAGNOSIS — F419 Anxiety disorder, unspecified: Secondary | ICD-10-CM | POA: Diagnosis not present

## 2018-12-01 LAB — NOVEL CORONAVIRUS, NAA: SARS-CoV-2, NAA: NOT DETECTED

## 2018-12-01 MED ORDER — ESCITALOPRAM OXALATE 5 MG PO TABS: 5.0000 mg | ORAL_TABLET | Freq: Every day | ORAL | 5 refills | Status: DC

## 2018-12-01 NOTE — Assessment & Plan Note (Addendum)
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

## 2018-12-14 ENCOUNTER — Other Ambulatory Visit: Payer: Self-pay

## 2018-12-14 DIAGNOSIS — Z20822 Contact with and (suspected) exposure to covid-19: Secondary | ICD-10-CM

## 2018-12-15 LAB — NOVEL CORONAVIRUS, NAA: SARS-CoV-2, NAA: NOT DETECTED

## 2018-12-27 IMAGING — DX DG WRIST COMPLETE 3+V*R*
4 series · 4 of 4 positions shown · non-contrast
Comparison: None.

CLINICAL DATA: Ganglion cyst anterior aspect of wrist with swelling
2 weeks. Painful.

EXAM:
RIGHT WRIST - COMPLETE 3+ VIEW

[wrist pa]
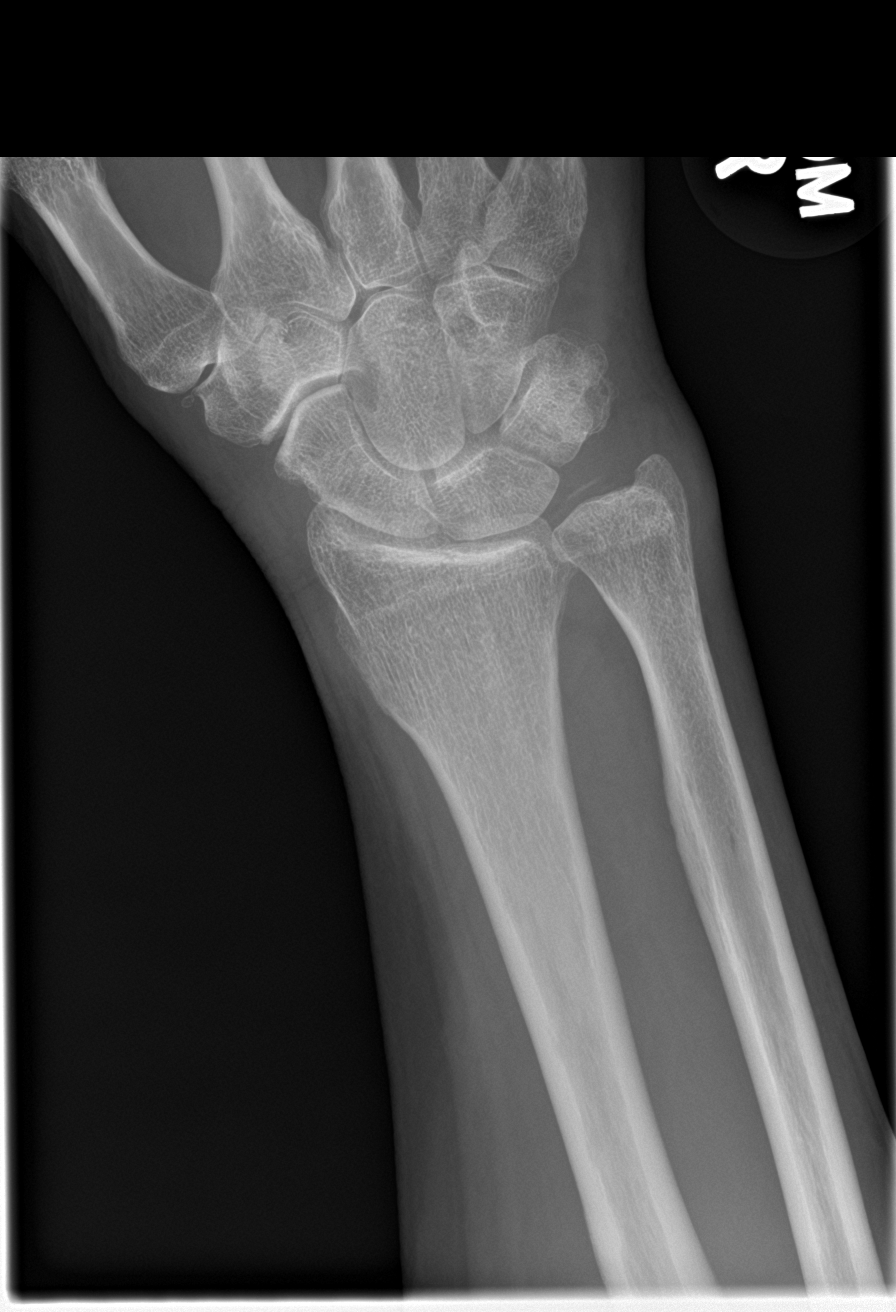

[wrist obl]
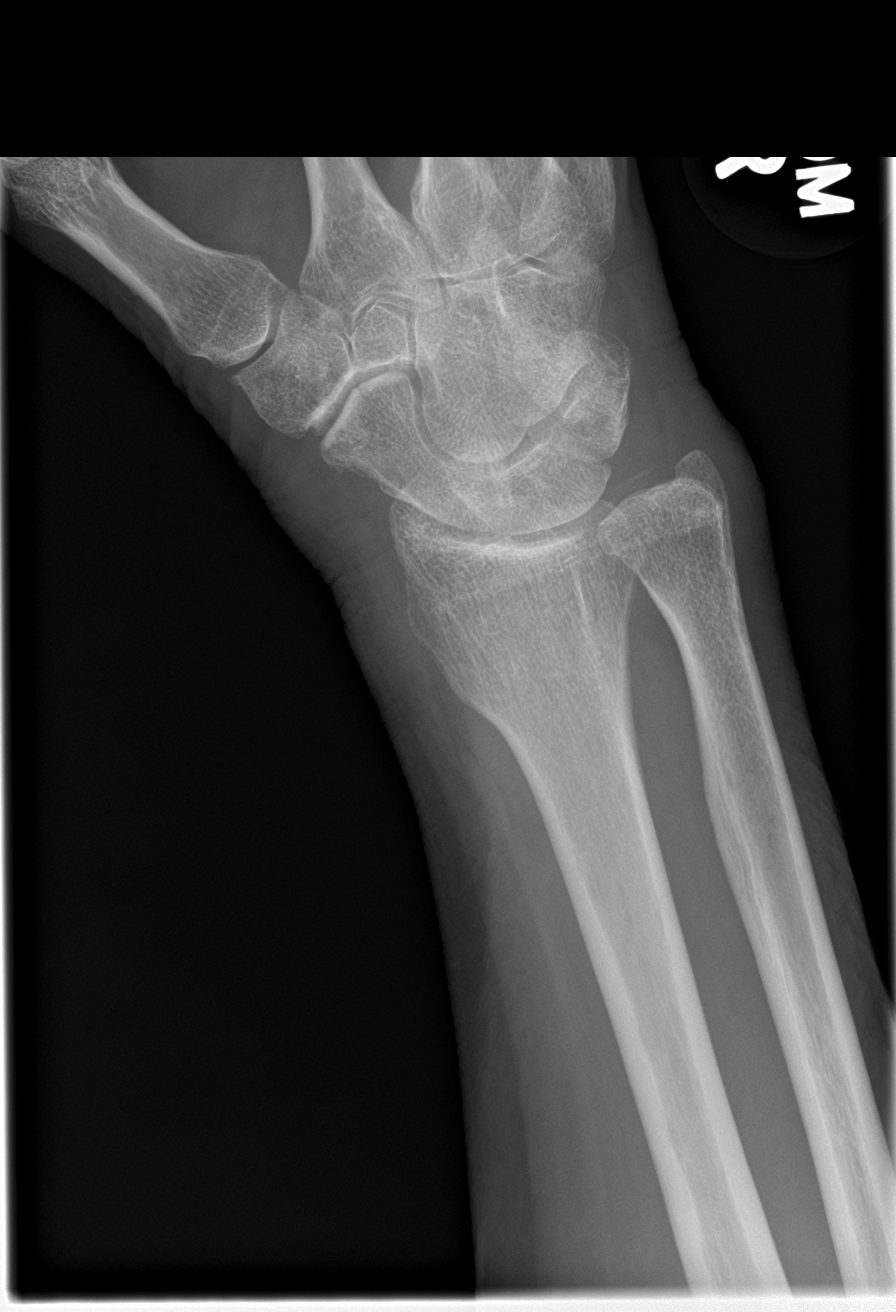

[wrist lat]
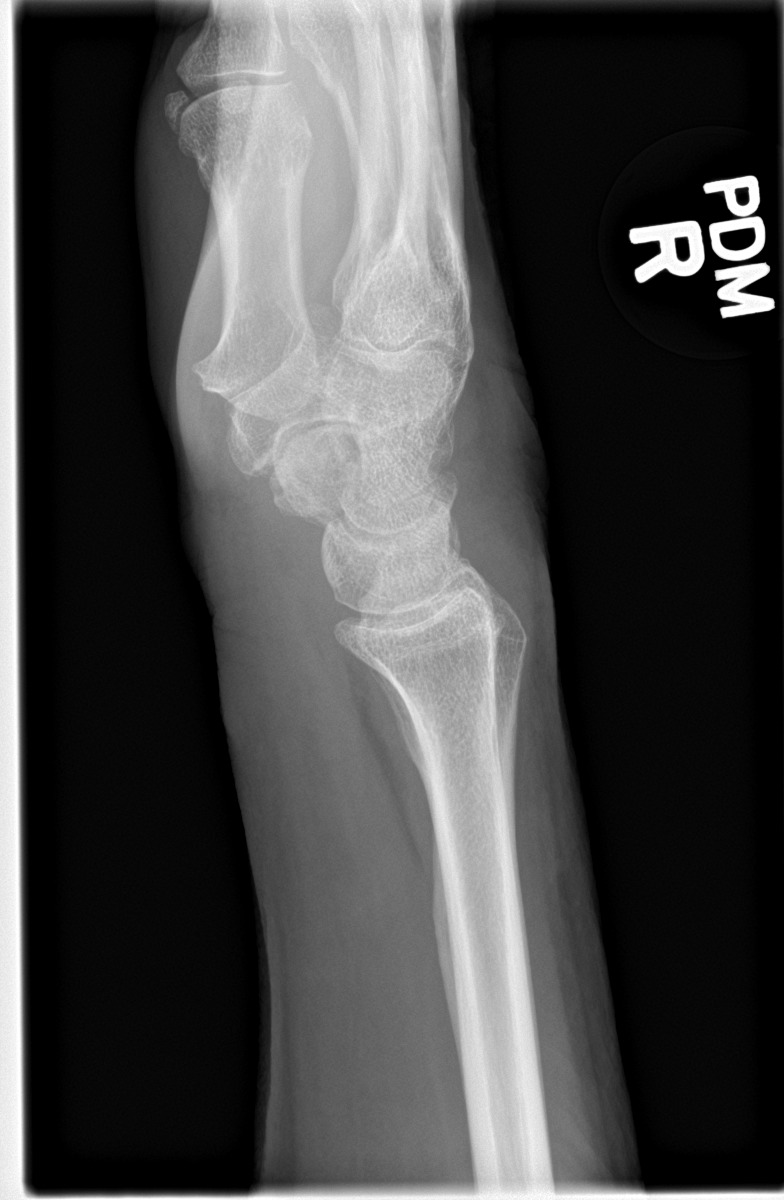

[navicular]
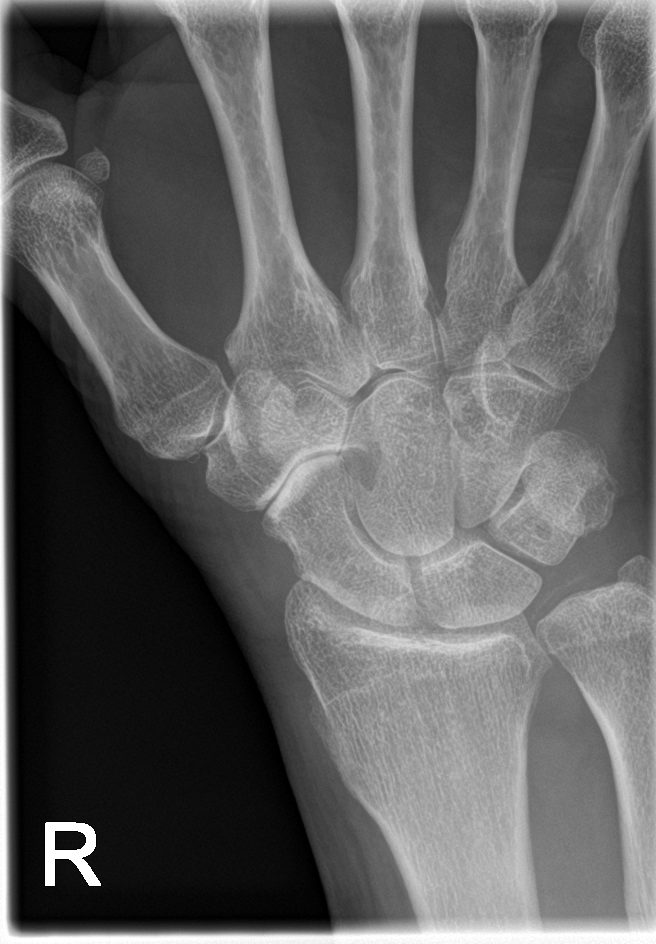

[4 of 4 positions shown; findings below may reference images not displayed]

FINDINGS: Mild degenerate change of the radiocarpal joint, radial side of the
carpal bones and first carpometacarpal joint. Minimal
chondrocalcinosis of the triangular fibrocartilage complex. Few
scattered subchondral cysts throughout the carpal bones. No acute
fracture or dislocation. Minimal prominence of the anterior soft
tissues of the wrist.
IMPRESSION: No acute findings. Mild soft tissue prominence over the anterior
wrist without discrete mass.

Degenerative changes as described.

## 2018-12-28 ENCOUNTER — Ambulatory Visit: Payer: Self-pay

## 2018-12-28 ENCOUNTER — Encounter (HOSPITAL_COMMUNITY): Payer: Self-pay

## 2018-12-28 ENCOUNTER — Other Ambulatory Visit: Payer: Self-pay

## 2018-12-28 ENCOUNTER — Encounter: Payer: Self-pay | Admitting: Internal Medicine

## 2018-12-28 ENCOUNTER — Emergency Department (HOSPITAL_COMMUNITY)
Admission: EM | Admit: 2018-12-28 | Discharge: 2018-12-28 | Disposition: A | Discharge status: Home / Self Care | Payer: Medicare Other | Attending: Emergency Medicine | Admitting: Emergency Medicine

## 2018-12-28 DIAGNOSIS — Z79899 Other long term (current) drug therapy: Secondary | ICD-10-CM | POA: Insufficient documentation

## 2018-12-28 DIAGNOSIS — I10 Essential (primary) hypertension: Secondary | ICD-10-CM | POA: Insufficient documentation

## 2018-12-28 HISTORY — DX: Essential (primary) hypertension: I10

## 2018-12-28 NOTE — Telephone Encounter (Signed)
noted 

## 2018-12-28 NOTE — ED Triage Notes (Signed)
Patient states she took her BP at home today because she felt different "Like a wave of dizziness, but was not bad enough that I had to sit down." BP was elevated.

## 2018-12-28 NOTE — Telephone Encounter (Signed)
Pt advised to go to ED

## 2018-12-28 NOTE — Telephone Encounter (Signed)
Patient called stating that she got OOB this AM she felt woozy.  She took her BP and it was elevated 181/86 at 9am.  She took her BP medication and rechecked BP 161/69.  She checked again at 10am 150/87.  She states that she was feeling lightheaded and rechecked after lunch and BP was 189/95 and 195/116.  She checked her pressure while I waited and go 183/101 Hr 65. She has not missed any of her medications.  She states that she walks daily. She states that she feels anxious and somewhat SOB. Per protocol patient will go to ER for evaluation. Care advice read to patient.  She verbalized understanding of all information.  Reason for Disposition . [id number] Systolic BP  >= 0000000 OR Diastolic >= [id number] AND [id number] cardiac or neurologic symptoms (e.g., chest pain, difficulty breathing, unsteady gait, blurred vision)  Answer Assessment - Initial Assessment Questions 1. BLOOD PRESSURE: "What is the blood pressure?" "Did you take at least two measurements 5 minutes apart?"    195/116, 193/99now then this am 181/86 and 161/69 2. ONSET: "When did you take your blood pressure?"     Today this afternoon 3. HOW: "How did you obtain the blood pressure?" (e.g., visiting nurse, automatic home BP monitor)     Home monitor 4. HISTORY: "Do you have a history of high blood pressure?"     HTN 5. MEDICATIONS: "Are you taking any medications for blood pressure?" "Have you missed any doses recently?"    Lisinopril 5 mg 6. OTHER SYMPTOMS: "Do you have any symptoms?" (e.g., headache, chest pain, blurred vision, difficulty breathing, weakness)    Weak on waking, wobbly feeling, anxious 7. PREGNANCY: "Is there any chance you are pregnant?" "When was your last menstrual period?"    N/A  Protocols used: HIGH BLOOD PRESSURE-A-AH

## 2018-12-28 NOTE — ED Provider Notes (Signed)
[facility] DEPT Provider Note   CSN: [id number] Arrival date & time: 12/28/18  1423     History   Chief Complaint Chief Complaint  Patient presents with  . Hypertension    HPI [name] is a 78 y.o. female.     The history is provided by the patient.  Hypertension This is a chronic problem. The current episode started more than 1 week ago. The problem occurs constantly. The problem has not changed since onset.Pertinent negatives include no chest pain, no abdominal pain, no headaches and no shortness of breath. Nothing aggravates the symptoms. Nothing relieves the symptoms. She has tried nothing for the symptoms. The treatment provided no relief.    Past Medical History:  Diagnosis Date  . Cancer ([place])    skin  cancer Nose  . COLONIC POLYPS, HX OF 10/31/2006  . DIVERTICULOSIS, COLON 10/21/2008  . ELEVATED BP READING WITHOUT DX HYPERTENSION 07/12/2009  . HYPERLIPIDEMIA 07/01/2006  . Hypertension   . PALPITATIONS, OCCASIONAL 10/21/2008  . VERTIGO 07/25/2009    Patient Active Problem List   Diagnosis Date Noted  . Anxiety 12/01/2018  . Hypertension 07/23/2018  . Family history of diabetes mellitus (DM) 07/20/2018  . 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 Personal history of other malignant neoplasm of skin 03/05/2017  . 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 Osteoarthritis 03/21/2016  . VERTIGO 07/25/2009  . DIVERTICULOSIS, COLON 10/21/2008  . PALPITATIONS, OCCASIONAL 10/21/2008  . History of colonic polyps 10/31/2006  . Dyslipidemia 07/01/2006    Past Surgical History:  Procedure Laterality Date  . ABDOMINAL HYSTERECTOMY    . COLONOSCOPY  02/24/2013  . LAMINECTOMY     lumbar  . ROTATOR CUFF REPAIR       OB History   No obstetric history on file.      Home Medications    Prior to Admission medications   Medication Sig Start Date End Date Taking? Authorizing Provider  calcium-vitamin D (OSCAL WITH  D) 500-200 MG-UNIT tablet Calcium 500 + D (D3)  1 po qd otc    [provider]  escitalopram (LEXAPRO) 5 MG tablet Take 1 tablet (5 mg total) by mouth daily. 12/01/18   Binnie Rail, MD  lisinopril (ZESTRIL) 5 MG tablet Take 1 tablet (5 mg total) by mouth daily. 07/23/18   Binnie Rail, MD  rosuvastatin (CRESTOR) 5 MG tablet Take 1 tablet (5 mg total) by mouth 3 (three) times a week. 07/24/18   Binnie Rail, MD    Family History Family History  Problem Relation Age of Onset  . Diabetes Sister   . Diabetes Brother   . Melanoma Brother   . Colon polyps Brother   . Diabetes Mother   . COPD Mother   . Hypertension Father   . Colon cancer Neg Hx   . Pancreatic cancer Neg Hx   . Stomach cancer Neg Hx   . Esophageal cancer Neg Hx   . Rectal cancer Neg Hx     Social History Social History   Tobacco Use  . Smoking status: Never Smoker  . Smokeless tobacco: Never Used  Substance Use Topics  . Alcohol use: Yes    Comment: wine occ  . Drug use: No     Allergies   Patient has no known allergies.   Review of Systems Review of Systems  Constitutional: Negative for chills and fever.  HENT: Negative for ear pain and sore throat.   Eyes: Negative  for pain and visual disturbance.  Respiratory: Negative for cough and shortness of breath.   Cardiovascular: Negative for chest pain and palpitations.  Gastrointestinal: Negative for abdominal pain and vomiting.  Genitourinary: Negative for dysuria and hematuria.  Musculoskeletal: Negative for arthralgias and back pain.  Skin: Negative for color change and rash.  Neurological: Negative for seizures, syncope and headaches.  All other systems reviewed and are negative.    Physical Exam Updated Vital Signs  ED Triage Vitals  Enc Vitals Group     BP 12/28/18 1422 (!) 206/77     Pulse Rate 12/28/18 1422 76     Resp 12/28/18 1422 18     Temp 12/28/18 1422 98.1 F (36.7 C)     Temp Source 12/28/18 1422 Oral     SpO2  12/28/18 1422 100 %     Weight 12/28/18 1430 162 lb (73.5 kg)     Height 12/28/18 1430 5\' 7"  (1.702 m)     Head Circumference --      Peak Flow --      Pain Score 12/28/18 1430 0     Pain Loc --      Pain Edu? --      Excl. in [place]? --     Physical Exam Vitals signs and nursing note reviewed.  Constitutional:      General: She is not in acute distress.    Appearance: She is well-developed. She is not ill-appearing.  HENT:     Head: Normocephalic and atraumatic.     Nose: Nose normal.     Mouth/Throat:     Mouth: Mucous membranes are moist.  Eyes:     Extraocular Movements: Extraocular movements intact.     Conjunctiva/sclera: Conjunctivae normal.     Pupils: Pupils are equal, round, and reactive to light.  Neck:     Musculoskeletal: Neck supple.  Cardiovascular:     Rate and Rhythm: Normal rate and regular rhythm.     Pulses: Normal pulses.     Heart sounds: Normal heart sounds. No murmur.  Pulmonary:     Effort: Pulmonary effort is normal. No respiratory distress.     Breath sounds: Normal breath sounds.  Abdominal:     General: Abdomen is flat.     Palpations: Abdomen is soft.     Tenderness: There is no abdominal tenderness.  Skin:    General: Skin is warm and dry.  Neurological:     General: No focal deficit present.     Mental Status: She is alert and oriented to person, place, and time.     Cranial Nerves: No cranial nerve deficit.     Sensory: No sensory deficit.     Motor: No weakness.     Coordination: Coordination normal.     Comments: 5+ out of 5 strength throughout, normal sensation, no drift, normal speech, normal finger-nose-finger      ED Treatments / Results  Labs (all labs ordered are listed, but only abnormal results are displayed) Labs Reviewed - No data to display  EKG EKG Interpretation  Date/Time:  Monday December 28 2018 16:17:04 EST Ventricular Rate:  63 PR Interval:    QRS Duration: 110 QT Interval:  441 QTC Calculation: 452 R  Axis:   4 Text Interpretation: Sinus rhythm Atrial premature complex RSR' in V1 or V2, right VCD or RVH Confirmed by Lennice Sites [phone]) on 12/28/2018 4:30:48 PM   Radiology No results found.  Procedures Procedures (including critical care time)  Medications  Ordered in ED Medications - No data to display   Initial Impression / Assessment and Plan / ED Course  I have reviewed the triage vital signs and the nursing notes.  Pertinent labs & imaging results that were available during my care of the patient were reviewed by me and considered in my medical decision making (see chart for details).     [name] is a 78 year old female with history of high blood pressure, high cholesterol who presents to the ED with hypertension.  Patient originally with blood pressure 206/77 however upon my evaluation she is 150/100.  She has no stroke symptoms.  No chest pain.  She felt some mild dizziness this morning that has resolved.  She denies any shortness of breath, vision changes.  She has taken all of her blood pressure medication today.  Overall asymptomatic hypertension.  No vision changes.  Normal neurological exam.  EKG shows sinus rhythm.  Recommend that she cut back on caffeine and recommend that she chart her blood pressure over the next several weeks and follow-up with her primary care doctor for medication adjustments.  Discharged from the ED in good condition.  Given return precautions.  This chart was dictated using voice recognition software.  Despite best efforts to proofread,  errors can occur which can change the documentation meaning.    Final Clinical Impressions(s) / ED Diagnoses   Final diagnoses:  Hypertension, unspecified type    ED Discharge Orders    None       Lennice Sites, DO 12/28/18 1649

## 2018-12-29 ENCOUNTER — Encounter: Payer: Self-pay | Admitting: Internal Medicine

## 2018-12-30 ENCOUNTER — Encounter: Payer: Self-pay | Admitting: Internal Medicine

## 2018-12-31 LAB — HM DEXA SCAN

## 2019-01-16 ENCOUNTER — Encounter: Payer: Self-pay | Admitting: Internal Medicine

## 2019-01-16 DIAGNOSIS — M810 Age-related osteoporosis without current pathological fracture: Secondary | ICD-10-CM | POA: Insufficient documentation

## 2019-01-16 DIAGNOSIS — M8580 Other specified disorders of bone density and structure, unspecified site: Secondary | ICD-10-CM | POA: Insufficient documentation

## 2019-01-25 ENCOUNTER — Ambulatory Visit: Payer: Medicare Other | Attending: Internal Medicine

## 2019-01-25 DIAGNOSIS — Z20822 Contact with and (suspected) exposure to covid-19: Secondary | ICD-10-CM | POA: Diagnosis not present

## 2019-01-26 ENCOUNTER — Other Ambulatory Visit: Payer: Self-pay | Admitting: Internal Medicine

## 2019-01-26 LAB — NOVEL CORONAVIRUS, NAA: SARS-CoV-2, NAA: NOT DETECTED

## 2019-01-27 ENCOUNTER — Encounter: Payer: Self-pay | Admitting: Internal Medicine

## 2019-01-28 ENCOUNTER — Other Ambulatory Visit: Payer: Self-pay | Admitting: Internal Medicine

## 2019-01-28 MED ORDER — LISINOPRIL 5 MG PO TABS: 10.0000 mg | ORAL_TABLET | Freq: Every day | ORAL | 1 refills | Status: DC

## 2019-01-28 MED ORDER — LISINOPRIL 20 MG PO TABS: 20.0000 mg | ORAL_TABLET | Freq: Every day | ORAL | 3 refills | Status: DC

## 2019-01-31 ENCOUNTER — Encounter: Payer: Self-pay | Admitting: Internal Medicine

## 2019-02-08 ENCOUNTER — Other Ambulatory Visit: Payer: Medicare PPO
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
## 2019-02-09 ENCOUNTER — Encounter: Payer: Self-pay | Admitting: Internal Medicine

## 2019-02-09 ENCOUNTER — Ambulatory Visit (INDEPENDENT_AMBULATORY_CARE_PROVIDER_SITE_OTHER): Payer: Medicare PPO | Admitting: Internal Medicine

## 2019-02-09 DIAGNOSIS — I10 Essential (primary) hypertension: Secondary | ICD-10-CM

## 2019-02-09 MED ORDER — AMLODIPINE BESYLATE 5 MG PO TABS: 5.0000 mg | ORAL_TABLET | Freq: Every day | ORAL | 1 refills | Status: DC

## 2019-02-09 NOTE — Assessment & Plan Note (Signed)
Chronic Uncontrolled BP at home typically 160's/80's Continue lisinopril at current dose Start amlodipine 5 mg daily May need to decrease lisinopril if BP improves She will monitor her BP closely - 2/day and update me

## 2019-02-10 ENCOUNTER — Ambulatory Visit: Payer: Medicare PPO | Attending: Internal Medicine

## 2019-02-10 DIAGNOSIS — Z23 Encounter for immunization: Secondary | ICD-10-CM | POA: Insufficient documentation

## 2019-02-10 NOTE — Progress Notes (Signed)
   Covid-19 Vaccination Clinic  Name:  [name]    MRN: [id number] DOB: [birth year]  02/10/2019  [name] was observed post Covid-19 immunization for 15 minutes . She was provided with Vaccine Information Sheet and instruction to access the V-Safe system. She complained of a fast heart beat but stated only anxiety.   VS HR 70, BP 146/67.  She stated she had started a new blood pressure medicine today and that she was fine and after sitting for 10 additional minutes stated she felt well.  [name] was instructed to call 911 with any severe reactions post vaccine: Marland Kitchen Difficulty breathing  . Swelling of your face and throat  . A fast heartbeat  . A bad rash all over your body  . Dizziness and weakness    Immunizations Administered    Name Date Dose VIS Date Route   Pfizer COVID-19 Vaccine 02/10/2019  6:04 PM 0.3 mL 01/01/2019 Intramuscular   Manufacturer: [place]   Lot: [id number]   [place]: [id number]

## 2019-02-12 ENCOUNTER — Encounter: Payer: Self-pay | Admitting: Internal Medicine

## 2019-02-12 ENCOUNTER — Other Ambulatory Visit: Payer: Self-pay | Admitting: Internal Medicine

## 2019-02-12 MED ORDER — ROSUVASTATIN CALCIUM 5 MG PO TABS: 5.0000 mg | ORAL_TABLET | ORAL | 5 refills | Status: DC

## 2019-02-17 ENCOUNTER — Encounter: Payer: Self-pay | Admitting: Internal Medicine

## 2019-02-17 MED ORDER — LISINOPRIL 20 MG PO TABS: 20.0000 mg | ORAL_TABLET | Freq: Every day | ORAL | 1 refills | Status: DC

## 2019-02-18 ENCOUNTER — Ambulatory Visit: Payer: Medicare Other

## 2019-03-02 ENCOUNTER — Encounter: Payer: Self-pay | Admitting: Internal Medicine

## 2019-03-03 ENCOUNTER — Ambulatory Visit: Payer: Medicare PPO | Attending: Internal Medicine

## 2019-03-03 DIAGNOSIS — Z23 Encounter for immunization: Secondary | ICD-10-CM | POA: Insufficient documentation

## 2019-03-03 NOTE — Progress Notes (Signed)
   Covid-19 Vaccination Clinic  Name:  [name]    MRN: [id number] DOB: 23-Nov-1940  03/03/2019  [name] was observed post Covid-19 immunization for 15 minutes without incidence. She was provided with Vaccine Information Sheet and instruction to access the V-Safe system.   [name] was instructed to call 911 with any severe reactions post vaccine: Marland Kitchen Difficulty breathing  . Swelling of your face and throat  . A fast heartbeat  . A bad rash all over your body  . Dizziness and weakness    Immunizations Administered    Name Date Dose VIS Date Route   Pfizer COVID-19 Vaccine 03/03/2019  8:05 AM 0.3 mL 01/01/2019 Intramuscular   Manufacturer: [place]   Lot: [id number]   [place]: [id number]

## 2019-03-18 DIAGNOSIS — D485 Neoplasm of uncertain behavior of skin: Secondary | ICD-10-CM | POA: Diagnosis not present

## 2019-03-25 DIAGNOSIS — M25562 Pain in left knee: Secondary | ICD-10-CM | POA: Diagnosis not present

## 2019-03-29 ENCOUNTER — Encounter: Payer: Self-pay | Admitting: Internal Medicine

## 2019-03-29 DIAGNOSIS — Z8739 Personal history of other diseases of the musculoskeletal system and connective tissue: Secondary | ICD-10-CM

## 2019-04-30 DIAGNOSIS — M25562 Pain in left knee: Secondary | ICD-10-CM | POA: Diagnosis not present

## 2019-04-30 DIAGNOSIS — M2392 Unspecified internal derangement of left knee: Secondary | ICD-10-CM | POA: Diagnosis not present

## 2019-04-30 DIAGNOSIS — M238X2 Other internal derangements of left knee: Secondary | ICD-10-CM | POA: Diagnosis not present

## 2019-05-13 DIAGNOSIS — M25562 Pain in left knee: Secondary | ICD-10-CM | POA: Diagnosis not present

## 2019-05-19 DIAGNOSIS — M1712 Unilateral primary osteoarthritis, left knee: Secondary | ICD-10-CM | POA: Diagnosis not present

## 2019-05-19 DIAGNOSIS — M25562 Pain in left knee: Secondary | ICD-10-CM | POA: Diagnosis not present

## 2019-05-26 ENCOUNTER — Other Ambulatory Visit: Payer: Self-pay | Admitting: Internal Medicine

## 2019-05-27 ENCOUNTER — Encounter: Payer: Self-pay | Admitting: Internal Medicine

## 2019-06-06 NOTE — Progress Notes (Signed)
Subjective:    Patient ID: [name], female    DOB: [birth year], 79 y.o.   MRN: [id number]  HPI She is here for a physical exam.    She is doing well overall.  She has no concerns.    Medications and allergies reviewed with patient and updated if appropriate.  Patient Active Problem List   Diagnosis Date Noted  . History of gout 03/29/2019  . Osteopenia with high frax 01/16/2019  . Anxiety 12/01/2018  . Hypertension 07/23/2018  . Family history of diabetes mellitus (DM) 07/20/2018  . 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 Personal history of other malignant neoplasm of skin 03/05/2017  . 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 Osteoarthritis 03/21/2016  . Vitamin D deficiency 02/06/2010  . VERTIGO 07/25/2009  . DIVERTICULOSIS, COLON 10/21/2008  . PALPITATIONS, OCCASIONAL 10/21/2008  . History of colonic polyps 10/31/2006  . Dyslipidemia 07/01/2006    Current Outpatient Medications on File Prior to Visit  Medication Sig Dispense Refill  . amLODipine (NORVASC) 5 MG tablet Take 1 tablet (5 mg total) by mouth daily. 90 tablet 1  . calcium-vitamin D (OSCAL WITH D) 500-200 MG-UNIT tablet Calcium 500 + D (D3)  1 po qd otc    . escitalopram (LEXAPRO) 5 MG tablet Take 1 tablet (5 mg total) by mouth daily. Needs office visit for more refills. 30 tablet 0  . lisinopril (ZESTRIL) 20 MG tablet Take 1 tablet (20 mg total) by mouth daily. 90 tablet 1  . rosuvastatin (CRESTOR) 5 MG tablet Take 1 tablet (5 mg total) by mouth 3 (three) times a week. 13 tablet 5   No current facility-administered medications on file prior to visit.    Past Medical History:  Diagnosis Date  . Cancer ([place])    skin  cancer Nose  . COLONIC POLYPS, HX OF 10/31/2006  . DIVERTICULOSIS, COLON 10/21/2008  . ELEVATED BP READING WITHOUT DX HYPERTENSION 07/12/2009  . HYPERLIPIDEMIA 07/01/2006  . Hypertension   . PALPITATIONS, OCCASIONAL 10/21/2008  . VERTIGO 07/25/2009    Past  Surgical History:  Procedure Laterality Date  . ABDOMINAL HYSTERECTOMY    . COLONOSCOPY  02/24/2013  . LAMINECTOMY     lumbar  . ROTATOR CUFF REPAIR      Social History   Socioeconomic History  . Marital status: Married    Spouse name: Not on file  . Number of children: Not on file  . Years of education: Not on file  . Highest education level: Not on file  Occupational History  . Not on file  Tobacco Use  . Smoking status: Never Smoker  . Smokeless tobacco: Never Used  Substance and Sexual Activity  . Alcohol use: Yes    Comment: wine occ  . Drug use: No  . Sexual activity: Not on file  Other Topics Concern  . Not on file  Social History Narrative  . Not on file   Social Determinants of Health   Financial Resource Strain:   . Difficulty of Paying Living Expenses:   Food Insecurity:   . Worried About Charity fundraiser in the Last Year:   . Arboriculturist in the Last Year:   Transportation Needs:   . Film/video editor (Medical):   Marland Kitchen Lack of Transportation (Non-Medical):   Physical Activity:   . Days of Exercise per Week:   . Minutes of Exercise per Session:   Stress:   . Feeling of Stress :  Social Connections:   . Frequency of Communication with Friends and Family:   . Frequency of Social Gatherings with Friends and Family:   . Attends Religious Services:   . Active Member of Clubs or Organizations:   . Attends Archivist Meetings:   Marland Kitchen Marital Status:     Family History  Problem Relation Age of Onset  . Diabetes Sister   . Diabetes Brother   . Melanoma Brother   . Colon polyps Brother   . Diabetes Mother   . COPD Mother   . Hypertension Father   . Colon cancer Neg Hx   . Pancreatic cancer Neg Hx   . Stomach cancer Neg Hx   . Esophageal cancer Neg Hx   . Rectal cancer Neg Hx     Review of Systems  Constitutional: Negative for chills and fever.  Eyes: Negative for visual disturbance.  Respiratory: Negative for cough and  wheezing.   Cardiovascular: Positive for palpitations (occ) and leg swelling (mild, occ). Negative for chest pain.  Gastrointestinal: Negative for abdominal pain, blood in stool, constipation, diarrhea and nausea.       Jerrye Bushy - once in a while  Genitourinary: Negative for dysuria and hematuria.  Musculoskeletal: Positive for arthralgias (mild stiffness in morning). Negative for back pain.  Skin: Positive for rash (right forearm, right leg - chronic - sees derm).  Neurological: Negative for dizziness, light-headedness and headaches.  Psychiatric/Behavioral: Negative for dysphoric mood and sleep disturbance. The patient is nervous/anxious (controlled).        Objective:   Vitals:   06/07/19 0805  BP: 112/60  Pulse: 67  Resp: 16  Temp: 98.3 F (36.8 C)  SpO2: 99%   Filed Weights   06/07/19 0805  Weight: 170 lb (77.1 kg)   Body mass index is 26.63 kg/m.  BP Readings from Last 3 Encounters:  06/07/19 112/60  12/28/18 (!) 151/104  09/15/18 131/71    Wt Readings from Last 3 Encounters:  06/07/19 170 lb (77.1 kg)  12/28/18 162 lb (73.5 kg)  09/15/18 173 lb (78.5 kg)     Physical Exam Constitutional: She appears well-developed and well-nourished. No distress.  HENT:  Head: Normocephalic and atraumatic.  Right Ear: External ear normal. Normal ear canal and TM Left Ear: External ear normal.  Normal ear canal and TM Mouth/Throat: Oropharynx is clear and moist.  Eyes: Conjunctivae and EOM are normal.  Neck: Neck supple. No tracheal deviation present. No thyromegaly present.  No carotid bruit  Cardiovascular: Normal rate, regular rhythm and normal heart sounds.   No murmur heard.  No edema. Pulmonary/Chest: Effort normal and breath sounds normal. No respiratory distress. She has no wheezes. She has no rales.  Breast: deferred   Abdominal: Soft. She exhibits no distension. There is no tenderness.  Lymphadenopathy: She has no cervical adenopathy.  Skin: Skin is warm and dry.  She is not diaphoretic.  Psychiatric: She has a normal mood and affect. Her behavior is normal.        Assessment & Plan:   Physical exam: Screening blood work    ordered Immunizations  Discussed shingrix, others up to date Colonoscopy  Up to date  Mammogram  Up to date Gyn  n/a Dexa  Up to date  Eye exams  Up to date Exercise  Yard work, walking [employer]  Weight good for age -- working on weight loss Substance abuse  None Sees derm annually  See Problem List for Assessment and Plan of chronic medical  problems.      This visit occurred during the SARS-CoV-2 public health emergency.  Safety protocols were in place, including screening questions prior to the visit, additional usage of staff PPE, and extensive cleaning of exam room while observing appropriate contact time as indicated for disinfecting solutions.

## 2019-06-06 NOTE — Patient Instructions (Addendum)
Blood work was ordered.    All other Health Maintenance issues reviewed.   All recommended immunizations and age-appropriate screenings are up-to-date or discussed.  No immunization administered today.   Medications reviewed and updated.  Changes include :   none    Please followup in 6 months    Health Maintenance, Female Adopting a healthy lifestyle and getting preventive care are important in promoting health and wellness. Ask your health care provider about:  The right schedule for you to have regular tests and exams.  Things you can do on your own to prevent diseases and keep yourself healthy. What should I know about diet, weight, and exercise? Eat a healthy diet   Eat a diet that includes plenty of vegetables, fruits, low-fat dairy products, and lean protein.  Do not eat a lot of foods that are high in solid fats, added sugars, or sodium. Maintain a healthy weight Body mass index (BMI) is used to identify weight problems. It estimates body fat based on height and weight. Your health care provider can help determine your BMI and help you achieve or maintain a healthy weight. Get regular exercise Get regular exercise. This is one of the most important things you can do for your health. Most adults should:  Exercise for at least 150 minutes each week. The exercise should increase your heart rate and make you sweat (moderate-intensity exercise).  Do strengthening exercises at least twice a week. This is in addition to the moderate-intensity exercise.  Spend less time sitting. Even light physical activity can be beneficial. Watch cholesterol and blood lipids Have your blood tested for lipids and cholesterol at 77 years of age, then have this test every 5 years. Have your cholesterol levels checked more often if:  Your lipid or cholesterol levels are high.  You are older than 77 years of age.  You are at high risk for heart disease. What should I know about cancer  screening? Depending on your health history and family history, you may need to have cancer screening at various ages. This may include screening for:  Breast cancer.  Cervical cancer.  Colorectal cancer.  Skin cancer.  Lung cancer. What should I know about heart disease, diabetes, and high blood pressure? Blood pressure and heart disease  High blood pressure causes heart disease and increases the risk of stroke. This is more likely to develop in people who have high blood pressure readings, are of African descent, or are overweight.  Have your blood pressure checked: ? Every 3-5 years if you are 18-39 years of age. ? Every year if you are 40 years old or older. Diabetes Have regular diabetes screenings. This checks your fasting blood sugar level. Have the screening done:  Once every three years after age 40 if you are at a normal weight and have a low risk for diabetes.  More often and at a younger age if you are overweight or have a high risk for diabetes. What should I know about preventing infection? Hepatitis B If you have a higher risk for hepatitis B, you should be screened for this virus. Talk with your health care provider to find out if you are at risk for hepatitis B infection. Hepatitis C Testing is recommended for:  Everyone born from 1945 through 1965.  Anyone with known risk factors for hepatitis C. Sexually transmitted infections (STIs)  Get screened for STIs, including gonorrhea and chlamydia, if: ? You are sexually active and are younger than 24 years of   of age. ? You are older than 77 years of age and your health care provider tells you that you are at risk for this type of infection. ? Your sexual activity has changed since you were last screened, and you are at increased risk for chlamydia or gonorrhea. Ask your health care provider if you are at risk.  Ask your health care provider about whether you are at high risk for HIV. Your health care provider may  recommend a prescription medicine to help prevent HIV infection. If you choose to take medicine to prevent HIV, you should first get tested for HIV. You should then be tested every 3 months for as long as you are taking the medicine. Pregnancy  If you are about to stop having your period (premenopausal) and you may become pregnant, seek counseling before you get pregnant.  Take 400 to 800 micrograms (mcg) of folic acid every day if you become pregnant.  Ask for birth control (contraception) if you want to prevent pregnancy. Osteoporosis and menopause Osteoporosis is a disease in which the bones lose minerals and strength with aging. This can result in bone fractures. If you are 65 years old or older, or if you are at risk for osteoporosis and fractures, ask your health care provider if you should:  Be screened for bone loss.  Take a calcium or vitamin D supplement to lower your risk of fractures.  Be given hormone replacement therapy (HRT) to treat symptoms of menopause. Follow these instructions at home: Lifestyle  Do not use any products that contain nicotine or tobacco, such as cigarettes, e-cigarettes, and chewing tobacco. If you need help quitting, ask your health care provider.  Do not use street drugs.  Do not share needles.  Ask your health care provider for help if you need support or information about quitting drugs. Alcohol use  Do not drink alcohol if: ? Your health care provider tells you not to drink. ? You are pregnant, may be pregnant, or are planning to become pregnant.  If you drink alcohol: ? Limit how much you use to 0-1 drink a day. ? Limit intake if you are breastfeeding.  Be aware of how much alcohol is in your drink. In the [place] one drink equals one 12 oz bottle of beer (355 mL), one 5 oz glass of wine (148 mL), or one 1 oz glass of hard liquor (44 mL). General instructions  Schedule regular health, dental, and eye exams.  Stay current with your  vaccines.  Tell your health care provider if: ? You often feel depressed. ? You have ever been abused or do not feel safe at home. Summary  Adopting a healthy lifestyle and getting preventive care are important in promoting health and wellness.  Follow your health care provider's instructions about healthy diet, exercising, and getting tested or screened for diseases.  Follow your health care provider's instructions on monitoring your cholesterol and blood pressure. This information is not intended to replace advice given to you by your health care provider. Make sure you discuss any questions you have with your health care provider. Document Revised: 12/31/2017 Document Reviewed: 12/31/2017 Elsevier Patient Education  2020 Elsevier Inc.  

## 2019-06-07 ENCOUNTER — Encounter: Payer: Self-pay | Admitting: Internal Medicine

## 2019-06-07 ENCOUNTER — Other Ambulatory Visit: Payer: Self-pay

## 2019-06-07 ENCOUNTER — Ambulatory Visit (INDEPENDENT_AMBULATORY_CARE_PROVIDER_SITE_OTHER): Payer: Medicare PPO | Admitting: Internal Medicine

## 2019-06-07 VITALS — BP 112/60 | HR 67 | Temp 98.3°F | Resp 16 | Ht 67.0 in | Wt 170.0 lb

## 2019-06-07 DIAGNOSIS — Z0000 Encounter for general adult medical examination without abnormal findings: Secondary | ICD-10-CM | POA: Diagnosis not present

## 2019-06-07 DIAGNOSIS — I10 Essential (primary) hypertension: Secondary | ICD-10-CM | POA: Diagnosis not present

## 2019-06-07 DIAGNOSIS — M85851 Other specified disorders of bone density and structure, right thigh: Secondary | ICD-10-CM

## 2019-06-07 DIAGNOSIS — E785 Hyperlipidemia, unspecified: Secondary | ICD-10-CM | POA: Diagnosis not present

## 2019-06-07 DIAGNOSIS — Z833 Family history of diabetes mellitus: Secondary | ICD-10-CM | POA: Diagnosis not present

## 2019-06-07 DIAGNOSIS — E559 Vitamin D deficiency, unspecified: Secondary | ICD-10-CM | POA: Diagnosis not present

## 2019-06-07 DIAGNOSIS — Z8739 Personal history of other diseases of the musculoskeletal system and connective tissue: Secondary | ICD-10-CM

## 2019-06-07 DIAGNOSIS — F419 Anxiety disorder, unspecified: Secondary | ICD-10-CM

## 2019-06-07 LAB — CBC WITH DIFFERENTIAL/PLATELET
Basophils Absolute: 0.1 10*3/uL (ref 0.0–0.1)
Basophils Relative: 2.2 % (ref 0.0–3.0)
Eosinophils Absolute: 0.2 10*3/uL (ref 0.0–0.7)
Eosinophils Relative: 4.6 % (ref 0.0–5.0)
HCT: 37.5 % (ref 36.0–46.0)
Hemoglobin: 12.4 g/dL (ref 12.0–15.0)
Lymphocytes Relative: 19.4 % (ref 12.0–46.0)
Lymphs Abs: 0.9 10*3/uL (ref 0.7–4.0)
MCHC: 33.1 g/dL (ref 30.0–36.0)
MCV: 93.5 fl (ref 78.0–100.0)
Monocytes Absolute: 0.4 10*3/uL (ref 0.1–1.0)
Monocytes Relative: 8.4 % (ref 3.0–12.0)
Neutro Abs: 2.9 10*3/uL (ref 1.4–7.7)
Neutrophils Relative %: 65.4 % (ref 43.0–77.0)
Platelets: 271 10*3/uL (ref 150.0–400.0)
RBC: 4.01 Mil/uL (ref 3.87–5.11)
RDW: 15.4 % (ref 11.5–15.5)
WBC: 4.4 10*3/uL (ref 4.0–10.5)

## 2019-06-07 LAB — COMPREHENSIVE METABOLIC PANEL
ALT: 25 U/L (ref 0–35)
AST: 20 U/L (ref 0–37)
Albumin: 4 g/dL (ref 3.5–5.2)
Alkaline Phosphatase: 62 U/L (ref 39–117)
BUN: 19 mg/dL (ref 6–23)
CO2: 31 mEq/L (ref 19–32)
Calcium: 8.9 mg/dL (ref 8.4–10.5)
Chloride: 105 mEq/L (ref 96–112)
Creatinine, Ser: 0.67 mg/dL (ref 0.40–1.20)
GFR: 85.3 mL/min (ref >60.00)
Glucose, Bld: 57 mg/dL — ABNORMAL LOW (ref 70–99)
Potassium: 4.2 mEq/L (ref 3.5–5.1)
Sodium: 141 mEq/L (ref 135–145)
Total Bilirubin: 0.4 mg/dL (ref 0.2–1.2)
Total Protein: 7 g/dL (ref 6.0–8.3)

## 2019-06-07 LAB — TSH: TSH: 3.08 u[IU]/mL (ref 0.35–4.50)

## 2019-06-07 LAB — VITAMIN D 25 HYDROXY (VIT D DEFICIENCY, FRACTURES): VITD: 38.69 ng/mL (ref 30.00–100.00)

## 2019-06-07 LAB — LIPID PANEL
Cholesterol: 237 mg/dL — ABNORMAL HIGH (ref 0–200)
HDL: 85.5 mg/dL (ref >39.00)
LDL Cholesterol: 138 mg/dL — ABNORMAL HIGH (ref 0–99)
NonHDL: 151.88
Total CHOL/HDL Ratio: 3
Triglycerides: 67 mg/dL (ref 0.0–149.0)
VLDL: 13.4 mg/dL (ref 0.0–40.0)

## 2019-06-07 LAB — URIC ACID: Uric Acid, Serum: 3.6 mg/dL (ref 2.4–7.0)

## 2019-06-07 LAB — HEMOGLOBIN A1C: Hgb A1c MFr Bld: 5.3 % (ref 4.6–6.5)

## 2019-06-07 MED ORDER — DICLOFENAC SODIUM 75 MG PO TBEC: 75.0000 mg | DELAYED_RELEASE_TABLET | Freq: Two times a day (BID) | ORAL | 0 refills | Status: DC | PRN

## 2019-06-07 NOTE — Assessment & Plan Note (Signed)
Chronic BP well controlled Current regimen effective and well tolerated Continue current medications at current doses cmp  

## 2019-06-07 NOTE — Assessment & Plan Note (Signed)
?   Had gout vs most likely inflammation from another reason Check uric acid level

## 2019-06-07 NOTE — Assessment & Plan Note (Signed)
Chronic Check lipid panel  Continue daily statin Regular exercise and healthy diet encouraged  

## 2019-06-07 NOTE — Assessment & Plan Note (Signed)
Check a1c 

## 2019-06-07 NOTE — Assessment & Plan Note (Signed)
Chronic Controlled, stable Continue current dose of medication lexapro 5 mg daily

## 2019-06-07 NOTE — Assessment & Plan Note (Signed)
Chronic dexa up to date Exercising Taking calcium and vitamin d

## 2019-06-07 NOTE — Assessment & Plan Note (Signed)
Chronic Taking vitamin d Check level 

## 2019-06-08 ENCOUNTER — Encounter: Payer: Self-pay | Admitting: Internal Medicine

## 2019-06-26 ENCOUNTER — Other Ambulatory Visit: Payer: Self-pay | Admitting: Internal Medicine

## 2019-07-19 ENCOUNTER — Encounter: Payer: Self-pay | Admitting: Internal Medicine

## 2019-07-20 NOTE — Progress Notes (Signed)
Subjective:    Patient ID: [name], female    DOB: April 20, 1940, 79 y.o.   MRN: [id number]  HPI The patient is here for an acute visit for recurrent rash.   Rash on RLE  - intermittent x 40 years.  Gets worse with sun, hot/humidweather.  It comes whenever she is in the sun.  This year was the first year she started getting it on her upper arms. The rash is itchy. She has mild erythema in cheeks.    Recently she has been very tired - has needed to take naps.  She has pain in joints and they have been swollen.  Right wrist, b/l ankles and left knee.      When she was 64 she was diagnosed with "erythematosis".  She was on prednisone.  She has not had joint pain since then - she was only getting the rash on her lower right leg.   She has flares every summer and anytime she is in the bright sun.  Sometimes she gets the rash when it is cold. She always avoids the sun and is fully clothed in the summer.  In the past few days her symptoms have improved.  The joint pain and swelling is not as bad and the rash on her arms is better.  The rash on her RLE is fading.   Medications and allergies reviewed with patient and updated if appropriate.  Patient Active Problem List   Diagnosis Date Noted  . Pain in left knee 03/25/2019  . Osteopenia with high frax 01/16/2019  . Anxiety 12/01/2018  . Hypertension 07/23/2018  . Family history of diabetes mellitus (DM) 07/20/2018  . 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 Personal history of other malignant neoplasm of skin 03/05/2017  . 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 Osteoarthritis 03/21/2016  . Vitamin D deficiency 02/06/2010  . VERTIGO 07/25/2009  . DIVERTICULOSIS, COLON 10/21/2008  . PALPITATIONS, OCCASIONAL 10/21/2008  . History of colonic polyps 10/31/2006  . Dyslipidemia 07/01/2006    Current Outpatient Medications on File Prior to Visit  Medication Sig Dispense Refill  . amLODipine (NORVASC) 5 MG tablet  Take 1 tablet (5 mg total) by mouth daily. 90 tablet 1  . calcium-vitamin D (OSCAL WITH D) 500-200 MG-UNIT tablet Calcium 500 + D (D3)  1 po qd otc    . desoximetasone (TOPICORT) 0.25 % cream Apply 1 application topically 2 (two) times daily.    . diclofenac (VOLTAREN) 75 MG EC tablet Take 1 tablet (75 mg total) by mouth 2 (two) times daily as needed (for knee inflammation). 30 tablet 0  . escitalopram (LEXAPRO) 5 MG tablet TAKE 1 TABLET ONCE DAILY. 90 tablet 1  . lisinopril (ZESTRIL) 20 MG tablet Take 1 tablet (20 mg total) by mouth daily. 90 tablet 1  . rosuvastatin (CRESTOR) 5 MG tablet Take 1 tablet (5 mg total) by mouth 3 (three) times a week. 13 tablet 5   No current facility-administered medications on file prior to visit.    Past Medical History:  Diagnosis Date  . Cancer ([place])    skin  cancer Nose  . COLONIC POLYPS, HX OF 10/31/2006  . DIVERTICULOSIS, COLON 10/21/2008  . ELEVATED BP READING WITHOUT DX HYPERTENSION 07/12/2009  . HYPERLIPIDEMIA 07/01/2006  . Hypertension   . PALPITATIONS, OCCASIONAL 10/21/2008  . VERTIGO 07/25/2009    Past Surgical History:  Procedure Laterality Date  . ABDOMINAL HYSTERECTOMY    . COLONOSCOPY  02/24/2013  .  LAMINECTOMY     lumbar  . ROTATOR CUFF REPAIR      Social History   Socioeconomic History  . Marital status: Married    Spouse name: Not on file  . Number of children: Not on file  . Years of education: Not on file  . Highest education level: Not on file  Occupational History  . Not on file  Tobacco Use  . Smoking status: Never Smoker  . Smokeless tobacco: Never Used  Vaping Use  . Vaping Use: Never used  Substance and Sexual Activity  . Alcohol use: Yes    Comment: wine occ  . Drug use: No  . Sexual activity: Not on file  Other Topics Concern  . Not on file  Social History Narrative  . Not on file   Social Determinants of Health   Financial Resource Strain:   . Difficulty of Paying Living Expenses:   Food  Insecurity:   . Worried About Charity fundraiser in the Last Year:   . Arboriculturist in the Last Year:   Transportation Needs:   . Film/video editor (Medical):   Marland Kitchen Lack of Transportation (Non-Medical):   Physical Activity:   . Days of Exercise per Week:   . Minutes of Exercise per Session:   Stress:   . Feeling of Stress :   Social Connections:   . Frequency of Communication with Friends and Family:   . Frequency of Social Gatherings with Friends and Family:   . Attends Religious Services:   . Active Member of Clubs or Organizations:   . Attends Archivist Meetings:   Marland Kitchen Marital Status:     Family History  Problem Relation Age of Onset  . Diabetes Sister   . Diabetes Brother   . Melanoma Brother   . Colon polyps Brother   . Diabetes Mother   . COPD Mother   . Hypertension Father   . Colon cancer Neg Hx   . Pancreatic cancer Neg Hx   . Stomach cancer Neg Hx   . Esophageal cancer Neg Hx   . Rectal cancer Neg Hx     Review of Systems  Constitutional: Positive for fatigue. Negative for chills and fever.  Eyes: Negative for visual disturbance.  Gastrointestinal: Negative for abdominal pain and nausea.  Musculoskeletal: Positive for arthralgias, joint swelling and myalgias (maybe).  Skin: Positive for rash.  Neurological: Positive for light-headedness (once in a while). Negative for headaches.       Objective:   Vitals:   07/21/19 1417  BP: 122/72  Pulse: 64  Temp: 98 F (36.7 C)  SpO2: 98%   BP Readings from Last 3 Encounters:  07/21/19 122/72  06/07/19 112/60  12/28/18 (!) 151/104   Wt Readings from Last 3 Encounters:  07/21/19 170 lb (77.1 kg)  06/07/19 170 lb (77.1 kg)  12/28/18 162 lb (73.5 kg)   Body mass index is 26.63 kg/m.   Physical Exam Constitutional:      General: She is not in acute distress.    Appearance: Normal appearance. She is not ill-appearing.  HENT:     Head: Normocephalic and atraumatic.  Cardiovascular:      Rate and Rhythm: Normal rate and regular rhythm.     Heart sounds: No murmur heard.   Pulmonary:     Effort: Pulmonary effort is normal. No respiratory distress.     Breath sounds: Normal breath sounds. No wheezing or rales.  Musculoskeletal:  Cervical back: Neck supple. No tenderness.     Right lower leg: No edema.     Left lower leg: No edema.     Comments: Minimal R wrist and ankle swelling - both joint feel warm.  OA changes in hand joints.    Lymphadenopathy:     Cervical: No cervical adenopathy.  Skin:    General: Skin is warm and dry.     Findings: Erythema (? mild erythema in b/l cheeks) and rash (Right lower anterior leg - see picture, minmal remaining rash on b/l arms - proximal forearms  - scaly with slight erythema) present.  Neurological:     Mental Status: She is alert.              Assessment & Plan:    See Problem List for Assessment and Plan of chronic medical problems.    This visit occurred during the SARS-CoV-2 public health emergency.  Safety protocols were in place, including screening questions prior to the visit, additional usage of staff PPE, and extensive cleaning of exam room while observing appropriate contact time as indicated for disinfecting solutions.

## 2019-07-21 ENCOUNTER — Other Ambulatory Visit: Payer: Self-pay

## 2019-07-21 ENCOUNTER — Ambulatory Visit: Payer: Medicare PPO | Admitting: Internal Medicine

## 2019-07-21 ENCOUNTER — Encounter: Payer: Self-pay | Admitting: Internal Medicine

## 2019-07-21 DIAGNOSIS — R21 Rash and other nonspecific skin eruption: Secondary | ICD-10-CM

## 2019-07-21 DIAGNOSIS — M2550 Pain in unspecified joint: Secondary | ICD-10-CM | POA: Insufficient documentation

## 2019-07-21 LAB — COMPREHENSIVE METABOLIC PANEL
ALT: 20 U/L (ref 0–35)
AST: 19 U/L (ref 0–37)
Albumin: 4.4 g/dL (ref 3.5–5.2)
Alkaline Phosphatase: 62 U/L (ref 39–117)
BUN: 22 mg/dL (ref 6–23)
CO2: 30 mEq/L (ref 19–32)
Calcium: 9.3 mg/dL (ref 8.4–10.5)
Chloride: 104 mEq/L (ref 96–112)
Creatinine, Ser: 0.96 mg/dL (ref 0.40–1.20)
GFR: 56.3 mL/min — ABNORMAL LOW (ref >60.00)
Glucose, Bld: 105 mg/dL — ABNORMAL HIGH (ref 70–99)
Potassium: 3.8 mEq/L (ref 3.5–5.1)
Sodium: 140 mEq/L (ref 135–145)
Total Bilirubin: 0.4 mg/dL (ref 0.2–1.2)
Total Protein: 7.7 g/dL (ref 6.0–8.3)

## 2019-07-21 LAB — CBC WITH DIFFERENTIAL/PLATELET
Basophils Absolute: 0.1 10*3/uL (ref 0.0–0.1)
Basophils Relative: 2 % (ref 0.0–3.0)
Eosinophils Absolute: 0.2 10*3/uL (ref 0.0–0.7)
Eosinophils Relative: 3.4 % (ref 0.0–5.0)
HCT: 37.9 % (ref 36.0–46.0)
Hemoglobin: 12.5 g/dL (ref 12.0–15.0)
Lymphocytes Relative: 26.3 % (ref 12.0–46.0)
Lymphs Abs: 1.5 10*3/uL (ref 0.7–4.0)
MCHC: 33.1 g/dL (ref 30.0–36.0)
MCV: 94.8 fl (ref 78.0–100.0)
Monocytes Absolute: 0.5 10*3/uL (ref 0.1–1.0)
Monocytes Relative: 8.2 % (ref 3.0–12.0)
Neutro Abs: 3.5 10*3/uL (ref 1.4–7.7)
Neutrophils Relative %: 60.1 % (ref 43.0–77.0)
Platelets: 312 10*3/uL (ref 150.0–400.0)
RBC: 3.99 Mil/uL (ref 3.87–5.11)
RDW: 14.9 % (ref 11.5–15.5)
WBC: 5.8 10*3/uL (ref 4.0–10.5)

## 2019-07-21 LAB — C-REACTIVE PROTEIN: CRP: 1.8 mg/dL (ref 0.5–20.0)

## 2019-07-21 LAB — SEDIMENTATION RATE: Sed Rate: 20 mm/hr (ref 0–30)

## 2019-07-21 NOTE — Assessment & Plan Note (Signed)
Chronic in nature, intermittent Occurs in RLE and this year b/l arms -- occurs generally after sun exposure, occ in cooler weather Started when she was 41 Likely autoimmune - possible lupus ANA, cbc, cmp, crp, esr, rf Referred to rheumatology

## 2019-07-21 NOTE — Assessment & Plan Note (Signed)
Acute Joint pain and swelling with warm sensation  R wrist/fingers, b/l ankles, left knee Improving with diclofenac - would like to avoid steroids Continue diclofenac bid - if joint pain does not continue to improve or worsens will consider prednisone Likely autoimmune - possible lupus ANA, cbc, cmp, crp, esr, rf Referred to rheumatology

## 2019-07-21 NOTE — Patient Instructions (Signed)
  Blood work was ordered.     Medications reviewed and updated.  Changes include :   none    A referral was ordered for Rheumatology.   Someone will call you to schedule this.

## 2019-07-23 ENCOUNTER — Encounter: Payer: Self-pay | Admitting: Internal Medicine

## 2019-07-24 LAB — ANTI-NUCLEAR AB-TITER (ANA TITER)
ANA TITER: 1:40 {titer} — ABNORMAL HIGH
ANA Titer 1: 1:80 {titer} — ABNORMAL HIGH

## 2019-07-24 LAB — RHEUMATOID FACTOR: Rheumatoid fact SerPl-aCnc: 14 IU/mL — ABNORMAL HIGH (ref <14)

## 2019-07-24 LAB — ANA: Anti Nuclear Antibody (ANA): POSITIVE — AB

## 2019-07-26 ENCOUNTER — Encounter: Payer: Self-pay | Admitting: Internal Medicine

## 2019-07-28 ENCOUNTER — Encounter: Payer: Self-pay | Admitting: Internal Medicine

## 2019-07-28 DIAGNOSIS — M2550 Pain in unspecified joint: Secondary | ICD-10-CM

## 2019-07-28 DIAGNOSIS — R768 Other specified abnormal immunological findings in serum: Secondary | ICD-10-CM

## 2019-08-04 ENCOUNTER — Encounter: Payer: Self-pay | Admitting: Internal Medicine

## 2019-08-04 DIAGNOSIS — R768 Other specified abnormal immunological findings in serum: Secondary | ICD-10-CM

## 2019-08-04 DIAGNOSIS — M2550 Pain in unspecified joint: Secondary | ICD-10-CM

## 2019-08-07 ENCOUNTER — Other Ambulatory Visit: Payer: Self-pay | Admitting: Internal Medicine

## 2019-08-09 ENCOUNTER — Other Ambulatory Visit: Payer: Self-pay | Admitting: Internal Medicine

## 2019-08-10 DIAGNOSIS — L720 Epidermal cyst: Secondary | ICD-10-CM | POA: Diagnosis not present

## 2019-08-10 DIAGNOSIS — L309 Dermatitis, unspecified: Secondary | ICD-10-CM | POA: Diagnosis not present

## 2019-08-10 DIAGNOSIS — D485 Neoplasm of uncertain behavior of skin: Secondary | ICD-10-CM | POA: Diagnosis not present

## 2019-08-10 DIAGNOSIS — L308 Other specified dermatitis: Secondary | ICD-10-CM | POA: Diagnosis not present

## 2019-08-24 ENCOUNTER — Other Ambulatory Visit: Payer: Self-pay | Admitting: Internal Medicine

## 2019-08-25 ENCOUNTER — Encounter: Payer: Self-pay | Admitting: Internal Medicine

## 2019-09-09 DIAGNOSIS — M25541 Pain in joints of right hand: Secondary | ICD-10-CM | POA: Diagnosis not present

## 2019-09-09 DIAGNOSIS — M1711 Unilateral primary osteoarthritis, right knee: Secondary | ICD-10-CM | POA: Diagnosis not present

## 2019-09-09 DIAGNOSIS — M47812 Spondylosis without myelopathy or radiculopathy, cervical region: Secondary | ICD-10-CM | POA: Diagnosis not present

## 2019-09-09 DIAGNOSIS — M7730 Calcaneal spur, unspecified foot: Secondary | ICD-10-CM | POA: Diagnosis not present

## 2019-09-09 DIAGNOSIS — M11231 Other chondrocalcinosis, right wrist: Secondary | ICD-10-CM | POA: Diagnosis not present

## 2019-09-09 DIAGNOSIS — M19072 Primary osteoarthritis, left ankle and foot: Secondary | ICD-10-CM | POA: Diagnosis not present

## 2019-09-09 DIAGNOSIS — M2011 Hallux valgus (acquired), right foot: Secondary | ICD-10-CM | POA: Diagnosis not present

## 2019-09-09 DIAGNOSIS — M50222 Other cervical disc displacement at C5-C6 level: Secondary | ICD-10-CM | POA: Diagnosis not present

## 2019-09-09 DIAGNOSIS — M8949 Other hypertrophic osteoarthropathy, multiple sites: Secondary | ICD-10-CM | POA: Diagnosis not present

## 2019-09-09 DIAGNOSIS — M79642 Pain in left hand: Secondary | ICD-10-CM | POA: Diagnosis not present

## 2019-09-09 DIAGNOSIS — M5030 Other cervical disc degeneration, unspecified cervical region: Secondary | ICD-10-CM | POA: Diagnosis not present

## 2019-09-09 DIAGNOSIS — M11232 Other chondrocalcinosis, left wrist: Secondary | ICD-10-CM | POA: Diagnosis not present

## 2019-09-09 DIAGNOSIS — M19031 Primary osteoarthritis, right wrist: Secondary | ICD-10-CM | POA: Diagnosis not present

## 2019-09-09 DIAGNOSIS — M2012 Hallux valgus (acquired), left foot: Secondary | ICD-10-CM | POA: Diagnosis not present

## 2019-09-09 DIAGNOSIS — M25542 Pain in joints of left hand: Secondary | ICD-10-CM | POA: Diagnosis not present

## 2019-09-09 DIAGNOSIS — Z833 Family history of diabetes mellitus: Secondary | ICD-10-CM | POA: Diagnosis not present

## 2019-09-09 DIAGNOSIS — R21 Rash and other nonspecific skin eruption: Secondary | ICD-10-CM | POA: Diagnosis not present

## 2019-09-09 DIAGNOSIS — R5382 Chronic fatigue, unspecified: Secondary | ICD-10-CM | POA: Diagnosis not present

## 2019-09-09 DIAGNOSIS — M21612 Bunion of left foot: Secondary | ICD-10-CM | POA: Diagnosis not present

## 2019-09-09 DIAGNOSIS — M79641 Pain in right hand: Secondary | ICD-10-CM | POA: Diagnosis not present

## 2019-09-09 DIAGNOSIS — I10 Essential (primary) hypertension: Secondary | ICD-10-CM | POA: Diagnosis not present

## 2019-09-09 DIAGNOSIS — M19032 Primary osteoarthritis, left wrist: Secondary | ICD-10-CM | POA: Diagnosis not present

## 2019-09-09 DIAGNOSIS — M19071 Primary osteoarthritis, right ankle and foot: Secondary | ICD-10-CM | POA: Diagnosis not present

## 2019-09-09 DIAGNOSIS — E785 Hyperlipidemia, unspecified: Secondary | ICD-10-CM | POA: Diagnosis not present

## 2019-09-09 DIAGNOSIS — R768 Other specified abnormal immunological findings in serum: Secondary | ICD-10-CM | POA: Diagnosis not present

## 2019-09-09 DIAGNOSIS — M2550 Pain in unspecified joint: Secondary | ICD-10-CM | POA: Diagnosis not present

## 2019-09-09 DIAGNOSIS — M1712 Unilateral primary osteoarthritis, left knee: Secondary | ICD-10-CM | POA: Diagnosis not present

## 2019-09-09 DIAGNOSIS — K5730 Diverticulosis of large intestine without perforation or abscess without bleeding: Secondary | ICD-10-CM | POA: Diagnosis not present

## 2019-09-09 DIAGNOSIS — E559 Vitamin D deficiency, unspecified: Secondary | ICD-10-CM | POA: Diagnosis not present

## 2019-09-16 DIAGNOSIS — M25562 Pain in left knee: Secondary | ICD-10-CM | POA: Diagnosis not present

## 2019-09-16 DIAGNOSIS — R21 Rash and other nonspecific skin eruption: Secondary | ICD-10-CM | POA: Diagnosis not present

## 2019-09-16 DIAGNOSIS — K5730 Diverticulosis of large intestine without perforation or abscess without bleeding: Secondary | ICD-10-CM | POA: Diagnosis not present

## 2019-09-16 DIAGNOSIS — E785 Hyperlipidemia, unspecified: Secondary | ICD-10-CM | POA: Diagnosis not present

## 2019-09-16 DIAGNOSIS — Z833 Family history of diabetes mellitus: Secondary | ICD-10-CM | POA: Diagnosis not present

## 2019-09-16 DIAGNOSIS — M8949 Other hypertrophic osteoarthropathy, multiple sites: Secondary | ICD-10-CM | POA: Diagnosis not present

## 2019-09-16 DIAGNOSIS — M11231 Other chondrocalcinosis, right wrist: Secondary | ICD-10-CM | POA: Diagnosis not present

## 2019-09-16 DIAGNOSIS — I10 Essential (primary) hypertension: Secondary | ICD-10-CM | POA: Diagnosis not present

## 2019-09-16 DIAGNOSIS — R002 Palpitations: Secondary | ICD-10-CM | POA: Diagnosis not present

## 2019-09-20 DIAGNOSIS — M79641 Pain in right hand: Secondary | ICD-10-CM | POA: Diagnosis not present

## 2019-09-30 DIAGNOSIS — M25571 Pain in right ankle and joints of right foot: Secondary | ICD-10-CM | POA: Diagnosis not present

## 2019-10-07 DIAGNOSIS — M25571 Pain in right ankle and joints of right foot: Secondary | ICD-10-CM | POA: Diagnosis not present

## 2019-10-19 ENCOUNTER — Encounter: Payer: Self-pay | Admitting: Internal Medicine

## 2019-10-19 NOTE — Telephone Encounter (Signed)
Pls advise on email.../lmb 

## 2019-10-29 DIAGNOSIS — H02834 Dermatochalasis of left upper eyelid: Secondary | ICD-10-CM | POA: Diagnosis not present

## 2019-10-29 DIAGNOSIS — H35371 Puckering of macula, right eye: Secondary | ICD-10-CM | POA: Diagnosis not present

## 2019-10-29 DIAGNOSIS — H02831 Dermatochalasis of right upper eyelid: Secondary | ICD-10-CM | POA: Diagnosis not present

## 2019-10-29 DIAGNOSIS — H43813 Vitreous degeneration, bilateral: Secondary | ICD-10-CM | POA: Diagnosis not present

## 2019-10-29 DIAGNOSIS — H04123 Dry eye syndrome of bilateral lacrimal glands: Secondary | ICD-10-CM | POA: Diagnosis not present

## 2019-11-10 ENCOUNTER — Other Ambulatory Visit: Payer: Self-pay | Admitting: Internal Medicine

## 2019-11-11 ENCOUNTER — Other Ambulatory Visit: Payer: Self-pay

## 2019-11-11 ENCOUNTER — Ambulatory Visit (INDEPENDENT_AMBULATORY_CARE_PROVIDER_SITE_OTHER): Payer: Medicare PPO

## 2019-11-11 DIAGNOSIS — Z0000 Encounter for general adult medical examination without abnormal findings: Secondary | ICD-10-CM

## 2019-11-11 NOTE — Patient Instructions (Addendum)
[name] , Thank you for taking time to come for your Medicare Wellness Visit. I appreciate your ongoing commitment to your health goals. Please review the following plan we discussed and let me know if I can assist you in the future.   Screening recommendations/referrals: Colonoscopy: 09/15/2018; no repeat due to age 79: 12/30/2018' scheduled for 12/30/2019 Bone Density: 12/31/2018; due every 2 years Recommended yearly ophthalmology/optometry visit for glaucoma screening and checkup Recommended yearly dental visit for hygiene and checkup  Vaccinations: Influenza vaccine: 10/18/2019 Pneumococcal vaccine: up to date Tdap vaccine: 10/16/2009; overdue Shingles vaccine: Zoster 2009   Covid-19: up to date with booster  Advanced directives: Please bring a copy of your health care power of attorney and living will to the office at your convenience.  Conditions/risks identified: Yes; Reviewed health maintenance screenings with patient today and relevant education, vaccines, and/or referrals were provided. Please continue to do your personal lifestyle choices by: daily care of teeth and gums, regular physical activity (goal should be 5 days a week for 30 minutes), eat a healthy diet, avoid tobacco and drug use, limiting any alcohol intake, taking a low-dose aspirin (if not allergic or have been advised by your provider otherwise) and taking vitamins and minerals as recommended by your provider. Continue doing brain stimulating activities (puzzles, reading, adult coloring books, staying active) to keep memory sharp. Continue to eat heart healthy diet (full of fruits, vegetables, whole grains, lean protein, water--limit salt, fat, and sugar intake) and increase physical activity as tolerated.  Next appointment: Please schedule your next Medicare Wellness Visit with your Nurse Health Advisor in 1 year.  Preventive Care 77 Years and Older, Female Preventive care refers to lifestyle choices and visits  with your health care provider that can promote health and wellness. What does preventive care include?  A yearly physical exam. This is also called an annual well check.  Dental exams once or twice a year.  Routine eye exams. Ask your health care provider how often you should have your eyes checked.  Personal lifestyle choices, including:  Daily care of your teeth and gums.  Regular physical activity.  Eating a healthy diet.  Avoiding tobacco and drug use.  Limiting alcohol use.  Practicing safe sex.  Taking low-dose aspirin every day.  Taking vitamin and mineral supplements as recommended by your health care provider. What happens during an annual well check? The services and screenings done by your health care provider during your annual well check will depend on your age, overall health, lifestyle risk factors, and family history of disease. Counseling  Your health care provider may ask you questions about your:  Alcohol use.  Tobacco use.  Drug use.  Emotional well-being.  Home and relationship well-being.  Sexual activity.  Eating habits.  History of falls.  Memory and ability to understand (cognition).  Work and work Statistician.  Reproductive health. Screening  You may have the following tests or measurements:  Height, weight, and BMI.  Blood pressure.  Lipid and cholesterol levels. These may be checked every 5 years, or more frequently if you are over 20 years old.  Skin check.  Lung cancer screening. You may have this screening every year starting at age 77 if you have a 30-pack-year history of smoking and currently smoke or have quit within the past 15 years.  Fecal occult blood test (FOBT) of the stool. You may have this test every year starting at age 77.  Flexible sigmoidoscopy or colonoscopy. You may have a  sigmoidoscopy every 5 years or a colonoscopy every 10 years starting at age 77.  Hepatitis C blood test.  Hepatitis B blood  test.  Sexually transmitted disease (STD) testing.  Diabetes screening. This is done by checking your blood sugar (glucose) after you have not eaten for a while (fasting). You may have this done every 1-3 years.  Bone density scan. This is done to screen for osteoporosis. You may have this done starting at age 77.  Mammogram. This may be done every 1-2 years. Talk to your health care provider about how often you should have regular mammograms. Talk with your health care provider about your test results, treatment options, and if necessary, the need for more tests. Vaccines  Your health care provider may recommend certain vaccines, such as:  Influenza vaccine. This is recommended every year.  Tetanus, diphtheria, and acellular pertussis (Tdap, Td) vaccine. You may need a Td booster every 10 years.  Zoster vaccine. You may need this after age 27.  Pneumococcal 13-valent conjugate (PCV13) vaccine. One dose is recommended after age 77.  Pneumococcal polysaccharide (PPSV23) vaccine. One dose is recommended after age 77. Talk to your health care provider about which screenings and vaccines you need and how often you need them. This information is not intended to replace advice given to you by your health care provider. Make sure you discuss any questions you have with your health care provider. Document Released: 02/03/2015 Document Revised: 09/27/2015 Document Reviewed: 11/08/2014 Elsevier Interactive Patient Education  2017 [place] Prevention in the Home Falls can cause injuries. They can happen to people of all ages. There are many things you can do to make your home safe and to help prevent falls. What can I do on the outside of my home?  Regularly fix the edges of walkways and driveways and fix any cracks.  Remove anything that might make you trip as you walk through a door, such as a raised step or threshold.  Trim any bushes or trees on the path to your home.  Use  bright outdoor lighting.  Clear any walking paths of anything that might make someone trip, such as rocks or tools.  Regularly check to see if handrails are loose or broken. Make sure that both sides of any steps have handrails.  Any raised decks and porches should have guardrails on the edges.  Have any leaves, snow, or ice cleared regularly.  Use sand or salt on walking paths during winter.  Clean up any spills in your garage right away. This includes oil or grease spills. What can I do in the bathroom?  Use night lights.  Install grab bars by the toilet and in the tub and shower. Do not use towel bars as grab bars.  Use non-skid mats or decals in the tub or shower.  If you need to sit down in the shower, use a plastic, non-slip stool.  Keep the floor dry. Clean up any water that spills on the floor as soon as it happens.  Remove soap buildup in the tub or shower regularly.  Attach bath mats securely with double-sided non-slip rug tape.  Do not have throw rugs and other things on the floor that can make you trip. What can I do in the bedroom?  Use night lights.  Make sure that you have a light by your bed that is easy to reach.  Do not use any sheets or blankets that are too big for your bed. They  should not hang down onto the floor.  Have a firm chair that has side arms. You can use this for support while you get dressed.  Do not have throw rugs and other things on the floor that can make you trip. What can I do in the kitchen?  Clean up any spills right away.  Avoid walking on wet floors.  Keep items that you use a lot in easy-to-reach places.  If you need to reach something above you, use a strong step stool that has a grab bar.  Keep electrical cords out of the way.  Do not use floor polish or wax that makes floors slippery. If you must use wax, use non-skid floor wax.  Do not have throw rugs and other things on the floor that can make you trip. What can  I do with my stairs?  Do not leave any items on the stairs.  Make sure that there are handrails on both sides of the stairs and use them. Fix handrails that are broken or loose. Make sure that handrails are as long as the stairways.  Check any carpeting to make sure that it is firmly attached to the stairs. Fix any carpet that is loose or worn.  Avoid having throw rugs at the top or bottom of the stairs. If you do have throw rugs, attach them to the floor with carpet tape.  Make sure that you have a light switch at the top of the stairs and the bottom of the stairs. If you do not have them, ask someone to add them for you. What else can I do to help prevent falls?  Wear shoes that:  Do not have high heels.  Have rubber bottoms.  Are comfortable and fit you well.  Are closed at the toe. Do not wear sandals.  If you use a stepladder:  Make sure that it is fully opened. Do not climb a closed stepladder.  Make sure that both sides of the stepladder are locked into place.  Ask someone to hold it for you, if possible.  Clearly mark and make sure that you can see:  Any grab bars or handrails.  First and last steps.  Where the edge of each step is.  Use tools that help you move around (mobility aids) if they are needed. These include:  Canes.  Walkers.  Scooters.  Crutches.  Turn on the lights when you go into a dark area. Replace any light bulbs as soon as they burn out.  Set up your furniture so you have a clear path. Avoid moving your furniture around.  If any of your floors are uneven, fix them.  If there are any pets around you, be aware of where they are.  Review your medicines with your doctor. Some medicines can make you feel dizzy. This can increase your chance of falling. Ask your doctor what other things that you can do to help prevent falls. This information is not intended to replace advice given to you by your health care provider. Make sure you  discuss any questions you have with your health care provider. Document Released: 11/03/2008 Document Revised: 06/15/2015 Document Reviewed: 02/11/2014 Elsevier Interactive Patient Education  2017 [employer].

## 2019-11-11 NOTE — Progress Notes (Signed)
I connected with Miguel Aschoff today by telephone and verified that I am speaking with the correct person using two identifiers. Location patient: home Location provider: work Persons participating in the virtual visit: Ivyonna Hoelzel and Lisette Abu, LPN.   I discussed the limitations, risks, security and privacy concerns of performing an evaluation and management service by telephone and the availability of in person appointments. I also discussed with the patient that there may be a patient responsible charge related to this service. The patient expressed understanding and verbally consented to this telephonic visit.    Interactive audio and video telecommunications were attempted between this provider and patient, however failed, due to patient having technical difficulties OR patient did not have access to video capability.  We continued and completed visit with audio only.  Some vital signs may be absent or patient reported.   Time Spent with patient on telephone encounter: 25 minutes  Subjective:   [name] is a 79 y.o. female who presents for Medicare Annual (Subsequent) preventive examination.  Review of Systems    No ROS. Medicare Wellness Visit. Cardiac Risk Factors include: advanced age (>48men, >45 women);dyslipidemia;family history of premature cardiovascular disease;hypertension     Objective:    There were no vitals filed for this visit. There is no height or weight on file to calculate BMI.  Advanced Directives 11/11/2019 12/28/2018  Does Patient Have a Medical Advance Directive? Yes Yes  Type of Paramedic of [place];Living will [place];Living will  Does patient want to make changes to medical advance directive? No - Patient declined -  Copy of [place] in Chart? No - copy requested -  Would patient like information on creating a medical advance directive? - No - Guardian declined     Current Medications (verified) Outpatient Encounter Medications as of 11/11/2019  Medication Sig  . amLODipine (NORVASC) 5 MG tablet TAKE 1 TABLET ONCE DAILY.  . calcium-vitamin D (OSCAL WITH D) 500-200 MG-UNIT tablet Calcium 500 + D (D3)  1 po qd otc  . desoximetasone (TOPICORT) 0.25 % cream Apply 1 application topically 2 (two) times daily.  . diclofenac (VOLTAREN) 75 MG EC tablet Take 1 tablet (75 mg total) by mouth 2 (two) times daily as needed (for knee inflammation).  Marland Kitchen escitalopram (LEXAPRO) 5 MG tablet TAKE 1 TABLET ONCE DAILY.  Marland Kitchen lisinopril (ZESTRIL) 20 MG tablet TAKE 1 TABLET ONCE DAILY.  . rosuvastatin (CRESTOR) 5 MG tablet TAKE 1 TABLET 3 TIMES PER WEEK.   No facility-administered encounter medications on file as of 11/11/2019.    Allergies (verified) Patient has no known allergies.   History: Past Medical History:  Diagnosis Date  . Cancer ([place])    skin  cancer Nose  . COLONIC POLYPS, HX OF 10/31/2006  . DIVERTICULOSIS, COLON 10/21/2008  . ELEVATED BP READING WITHOUT DX HYPERTENSION 07/12/2009  . HYPERLIPIDEMIA 07/01/2006  . Hypertension   . PALPITATIONS, OCCASIONAL 10/21/2008  . VERTIGO 07/25/2009   Past Surgical History:  Procedure Laterality Date  . ABDOMINAL HYSTERECTOMY    . COLONOSCOPY  02/24/2013  . LAMINECTOMY     lumbar  . ROTATOR CUFF REPAIR     Family History  Problem Relation Age of Onset  . Diabetes Sister   . Diabetes Brother   . Melanoma Brother   . Colon polyps Brother   . Diabetes Mother   . COPD Mother   . Hypertension Father   . Colon cancer Neg Hx   .  Pancreatic cancer Neg Hx   . Stomach cancer Neg Hx   . Esophageal cancer Neg Hx   . Rectal cancer Neg Hx    Social History   Socioeconomic History  . Marital status: Married    Spouse name: Not on file  . Number of children: Not on file  . Years of education: Not on file  . Highest education level: Not on file  Occupational History  . Not on file  Tobacco Use  . Smoking  status: Never Smoker  . Smokeless tobacco: Never Used  Vaping Use  . Vaping Use: Never used  Substance and Sexual Activity  . Alcohol use: Yes    Comment: wine occ  . Drug use: No  . Sexual activity: Not Currently  Other Topics Concern  . Not on file  Social History Narrative  . Not on file   Social Determinants of Health   Financial Resource Strain: Low Risk   . Difficulty of Paying Living Expenses: Not hard at all  Food Insecurity: No Food Insecurity  . Worried About Charity fundraiser in the Last Year: Never true  . Ran Out of Food in the Last Year: Never true  Transportation Needs: No Transportation Needs  . Lack of Transportation (Medical): No  . Lack of Transportation (Non-Medical): No  Physical Activity: Sufficiently Active  . Days of Exercise per Week: 7 days  . Minutes of Exercise per Session: 60 min  Stress: No Stress Concern Present  . Feeling of Stress : Not at all  Social Connections: Moderately Integrated  . Frequency of Communication with Friends and Family: More than three times a week  . Frequency of Social Gatherings with Friends and Family: More than three times a week  . Attends Religious Services: More than 4 times per year  . Active Member of Clubs or Organizations: Yes  . Attends Archivist Meetings: More than 4 times per year  . Marital Status: Widowed    Tobacco Counseling Counseling given: Not Answered   Clinical Intake:  Pre-visit preparation completed: Yes  Pain : No/denies pain     Nutritional Risks: None Diabetes: No  How often do you need to have someone help you when you read instructions, pamphlets, or other written materials from your doctor or pharmacy?: 1 - Never What is the last grade level you completed in school?: Doctorate degree in Education  Diabetic? no  Interpreter Needed?: No  Information entered by :: Everson Mott N. Mina Babula, LPN   Activities of Daily Living In your present state of health, do you  have any difficulty performing the following activities: 11/11/2019  Hearing? N  Vision? N  Difficulty concentrating or making decisions? N  Walking or climbing stairs? N  Dressing or bathing? N  Doing errands, shopping? N  Preparing Food and eating ? N  Using the Toilet? N  In the past six months, have you accidently leaked urine? N  Do you have problems with loss of bowel control? N  Managing your Medications? N  Managing your Finances? N  Housekeeping or managing your Housekeeping? N  Some recent data might be hidden    Patient Care Team: Binnie Rail, MD as PCP - General (Internal Medicine)  Indicate any recent Medical Services you may have received from other than [facility] providers in the past year (date may be approximate).     Assessment:   This is a routine wellness examination for [facility].  Hearing/Vision screen No exam data present  Dietary issues and exercise activities discussed: Current Exercise Habits: Home exercise routine, Type of exercise: walking, Time (Minutes): 60, Frequency (Times/Week): 7, Weekly Exercise (Minutes/Week): 420, Intensity: Moderate, Exercise limited by: orthopedic condition(s)  Goals    . DIET - INCREASE WATER INTAKE     I would like to lose 20 pounds.      Depression Screen PHQ 2/9 Scores 11/11/2019 07/20/2018 03/04/2017 09/29/2014 08/11/2014 04/27/2013  PHQ - 2 Score 0 1 0 0 0 0  PHQ- 9 Score - 6 - - - -    Fall Risk Fall Risk  11/11/2019 07/21/2019 07/20/2018 03/04/2017 09/29/2014  Falls in the past year? 0 0 0 No No  Number falls in past yr: 0 0 0 - -  Injury with Fall? 0 0 - - -  Risk for fall due to : No Fall Risks No Fall Risks - - -  Follow up Falls evaluation completed - - - -    Any stairs in or around the home? Yes  If so, are there any without handrails? No  Home free of loose throw rugs in walkways, pet beds, electrical cords, etc? Yes  Adequate lighting in your home to reduce risk of falls? Yes   ASSISTIVE DEVICES UTILIZED  TO PREVENT FALLS:  Life alert? No  Use of a cane, walker or w/c? No  Grab bars in the bathroom? Yes  Shower chair or bench in shower? No  Elevated toilet seat or a handicapped toilet? Yes   TIMED UP AND GO:  Was the test performed? No .  Length of time to ambulate 10 feet: 0 sec.   Gait steady and fast without use of assistive device  Cognitive Function: not indicated.        Immunizations Immunization History  Administered Date(s) Administered  . Influenza Split 01/07/2011, 10/03/2011  . Influenza Whole 11/24/2006, 10/21/2008, 11/30/2009  . Influenza, High Dose Seasonal PF 10/21/2013, 01/29/2018, 12/07/2018, 10/18/2019  . Influenza,inj,Quad PF,6+ Mos 11/30/2015  . Influenza-Unspecified 11/15/2012, 11/29/2014, 11/05/2016  . PFIZER SARS-COV-2 Vaccination 02/10/2019, 03/03/2019, 09/19/2019  . Pneumococcal Conjugate-13 04/27/2013  . Pneumococcal Polysaccharide-23 10/21/2008  . Td 10/16/2009  . Zoster 07/06/2007    TDAP status: Due, Education has been provided regarding the importance of this vaccine. Advised may receive this vaccine at local pharmacy or Health Dept. Aware to provide a copy of the vaccination record if obtained from local pharmacy or Health Dept. Verbalized acceptance and understanding. Flu Vaccine status: Up to date Pneumococcal vaccine status: Up to date Covid-19 vaccine status: Completed vaccines  Qualifies for Shingles Vaccine? Yes   Zostavax completed Yes   Shingrix Completed?: No.    Education has been provided regarding the importance of this vaccine. Patient has been advised to call insurance company to determine out of pocket expense if they have not yet received this vaccine. Advised may also receive vaccine at local pharmacy or Health Dept. Verbalized acceptance and understanding.  Screening Tests Health Maintenance  Topic Date Due  . TETANUS/TDAP  10/17/2019  . MAMMOGRAM  12/30/2019  . DEXA SCAN  12/30/2020  . COLONOSCOPY  09/15/2023  .  INFLUENZA VACCINE  Completed  . COVID-19 Vaccine  Completed  . Hepatitis C Screening  Completed  . PNA vac Low Risk Adult  Completed    Health Maintenance  Health Maintenance Due  Topic Date Due  . TETANUS/TDAP  10/17/2019    Colorectal cancer screening: No longer required.  Mammogram status: Completed 12/30/2018. Repeat every year Bone Density status: Completed 12/31/2018.  Results reflect: Bone density results: OSTEOPENIA. Repeat every 2 years.  Lung Cancer Screening: (Low Dose CT Chest recommended if Age 67-80 years, 30 pack-year currently smoking OR have quit w/in 15years.) does not qualify.   Lung Cancer Screening Referral: no  Additional Screening:  Hepatitis C Screening: does qualify; Completed yes  Vision Screening: Recommended annual ophthalmology exams for early detection of glaucoma and other disorders of the eye. Is the patient up to date with their annual eye exam?  Yes  Who is the provider or what is the name of the office in which the patient attends annual eye exams? [facility] If pt is not established with a provider, would they like to be referred to a provider to establish care? No .   Dental Screening: Recommended annual dental exams for proper oral hygiene  Community Resource Referral / Chronic Care Management: CRR required this visit?  No   CCM required this visit?  No      Plan:     I have personally reviewed and noted the following in the patient's chart:   . Medical and social history . Use of alcohol, tobacco or illicit drugs  . Current medications and supplements . Functional ability and status . Nutritional status . Physical activity . Advanced directives . List of other physicians . Hospitalizations, surgeries, and ER visits in previous 12 months . Vitals . Screenings to include cognitive, depression, and falls . Referrals and appointments  In addition, I have reviewed and discussed with patient certain preventive  protocols, quality metrics, and best practice recommendations. A written personalized care plan for preventive services as well as general preventive health recommendations were provided to patient.     Sheral Flow, LPN   [id number]   Nurse Notes:  Patient is cogitatively intact. There were no vitals filed for this visit. There is no height or weight on file to calculate BMI. Patient stated that she has no issues with gait or balance; does not use any assistive devices.

## 2019-11-25 ENCOUNTER — Other Ambulatory Visit: Payer: Self-pay | Admitting: Internal Medicine

## 2019-12-03 ENCOUNTER — Ambulatory Visit: Payer: Medicare PPO | Admitting: Rheumatology

## 2019-12-09 DIAGNOSIS — R42 Dizziness and giddiness: Secondary | ICD-10-CM | POA: Diagnosis not present

## 2019-12-09 DIAGNOSIS — E785 Hyperlipidemia, unspecified: Secondary | ICD-10-CM | POA: Diagnosis not present

## 2019-12-09 DIAGNOSIS — Z833 Family history of diabetes mellitus: Secondary | ICD-10-CM | POA: Diagnosis not present

## 2019-12-09 DIAGNOSIS — M25562 Pain in left knee: Secondary | ICD-10-CM | POA: Diagnosis not present

## 2019-12-09 DIAGNOSIS — Z85828 Personal history of other malignant neoplasm of skin: Secondary | ICD-10-CM | POA: Diagnosis not present

## 2019-12-09 DIAGNOSIS — M8949 Other hypertrophic osteoarthropathy, multiple sites: Secondary | ICD-10-CM | POA: Diagnosis not present

## 2019-12-09 DIAGNOSIS — K5730 Diverticulosis of large intestine without perforation or abscess without bleeding: Secondary | ICD-10-CM | POA: Diagnosis not present

## 2019-12-09 DIAGNOSIS — M11231 Other chondrocalcinosis, right wrist: Secondary | ICD-10-CM | POA: Diagnosis not present

## 2019-12-09 DIAGNOSIS — M659 Synovitis and tenosynovitis, unspecified: Secondary | ICD-10-CM | POA: Diagnosis not present

## 2019-12-29 ENCOUNTER — Ambulatory Visit: Payer: Medicare PPO | Admitting: Rheumatology

## 2020-01-07 ENCOUNTER — Other Ambulatory Visit: Payer: Self-pay | Admitting: Internal Medicine

## 2020-01-28 ENCOUNTER — Other Ambulatory Visit: Payer: Self-pay | Admitting: Internal Medicine

## 2020-03-06 ENCOUNTER — Other Ambulatory Visit: Payer: Self-pay | Admitting: Internal Medicine

## 2020-03-20 DIAGNOSIS — R768 Other specified abnormal immunological findings in serum: Secondary | ICD-10-CM | POA: Diagnosis not present

## 2020-03-20 DIAGNOSIS — M659 Synovitis and tenosynovitis, unspecified: Secondary | ICD-10-CM | POA: Diagnosis not present

## 2020-03-20 DIAGNOSIS — E559 Vitamin D deficiency, unspecified: Secondary | ICD-10-CM | POA: Diagnosis not present

## 2020-03-20 DIAGNOSIS — I10 Essential (primary) hypertension: Secondary | ICD-10-CM | POA: Diagnosis not present

## 2020-03-20 DIAGNOSIS — M25562 Pain in left knee: Secondary | ICD-10-CM | POA: Diagnosis not present

## 2020-03-20 DIAGNOSIS — Z85828 Personal history of other malignant neoplasm of skin: Secondary | ICD-10-CM | POA: Diagnosis not present

## 2020-03-20 DIAGNOSIS — E785 Hyperlipidemia, unspecified: Secondary | ICD-10-CM | POA: Diagnosis not present

## 2020-03-20 DIAGNOSIS — M8949 Other hypertrophic osteoarthropathy, multiple sites: Secondary | ICD-10-CM | POA: Diagnosis not present

## 2020-03-20 DIAGNOSIS — M11231 Other chondrocalcinosis, right wrist: Secondary | ICD-10-CM | POA: Diagnosis not present

## 2020-03-21 ENCOUNTER — Other Ambulatory Visit: Payer: Self-pay

## 2020-03-23 ENCOUNTER — Ambulatory Visit: Payer: Medicare PPO | Admitting: Internal Medicine

## 2020-03-23 ENCOUNTER — Encounter: Payer: Self-pay | Admitting: Internal Medicine

## 2020-03-23 ENCOUNTER — Other Ambulatory Visit: Payer: Self-pay

## 2020-03-23 VITALS — BP 122/78 | HR 67 | Temp 98.0°F | Ht 67.0 in | Wt 171.0 lb

## 2020-03-23 DIAGNOSIS — M2550 Pain in unspecified joint: Secondary | ICD-10-CM

## 2020-03-23 DIAGNOSIS — M8949 Other hypertrophic osteoarthropathy, multiple sites: Secondary | ICD-10-CM | POA: Diagnosis not present

## 2020-03-23 DIAGNOSIS — I10 Essential (primary) hypertension: Secondary | ICD-10-CM | POA: Diagnosis not present

## 2020-03-23 DIAGNOSIS — M159 Polyosteoarthritis, unspecified: Secondary | ICD-10-CM

## 2020-03-23 DIAGNOSIS — E785 Hyperlipidemia, unspecified: Secondary | ICD-10-CM

## 2020-03-23 DIAGNOSIS — F419 Anxiety disorder, unspecified: Secondary | ICD-10-CM

## 2020-03-23 DIAGNOSIS — M150 Primary generalized (osteo)arthritis: Secondary | ICD-10-CM

## 2020-03-23 LAB — LIPID PANEL
Cholesterol: 240 mg/dL — ABNORMAL HIGH (ref 0–200)
HDL: 90 mg/dL (ref >39.00)
LDL Cholesterol: 136 mg/dL — ABNORMAL HIGH (ref 0–99)
NonHDL: 150.11
Total CHOL/HDL Ratio: 3
Triglycerides: 73 mg/dL (ref 0.0–149.0)
VLDL: 14.6 mg/dL (ref 0.0–40.0)

## 2020-03-23 LAB — C-REACTIVE PROTEIN: CRP: <1 mg/dL (ref 0.5–20.0)

## 2020-03-23 LAB — COMPREHENSIVE METABOLIC PANEL
ALT: 20 U/L (ref 0–35)
AST: 20 U/L (ref 0–37)
Albumin: 4 g/dL (ref 3.5–5.2)
Alkaline Phosphatase: 58 U/L (ref 39–117)
BUN: 22 mg/dL (ref 6–23)
CO2: 28 mEq/L (ref 19–32)
Calcium: 9.2 mg/dL (ref 8.4–10.5)
Chloride: 104 mEq/L (ref 96–112)
Creatinine, Ser: 0.63 mg/dL (ref 0.40–1.20)
GFR: 85.21 mL/min (ref >60.00)
Glucose, Bld: 78 mg/dL (ref 70–99)
Potassium: 4.5 mEq/L (ref 3.5–5.1)
Sodium: 138 mEq/L (ref 135–145)
Total Bilirubin: 0.6 mg/dL (ref 0.2–1.2)
Total Protein: 7.4 g/dL (ref 6.0–8.3)

## 2020-03-23 NOTE — Assessment & Plan Note (Signed)
Chronic Check lipid panel  Continue Crestor 5 mg daily Regular exercise and healthy diet encouraged  

## 2020-03-23 NOTE — Patient Instructions (Addendum)
  Blood work was ordered.     Medications changes include :   none     Maximum dose of Tylenol 3000 mg a day.  Take this first when you have pain.  Take diclofenac or ibuprofen if needed - try to avoid.

## 2020-03-23 NOTE — Assessment & Plan Note (Signed)
Chronic BP well controlled Continue amlodipine 5 mg daily, lisinopril 20 mg daily cmp

## 2020-03-23 NOTE — Assessment & Plan Note (Signed)
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

## 2020-03-23 NOTE — Assessment & Plan Note (Signed)
Chronic Diffuse Also being worked up for possible rheumatoid arthritis by rheumatology CRP, CCP, RF today Advised Tylenol as needed for pain, take diclofenac or ibuprofen only for severe pain, but ideally avoid.  Discussed possible side effects

## 2020-03-23 NOTE — Assessment & Plan Note (Signed)
Chronic Controlled, stable Continue Lexapro 5 mg daily

## 2020-03-23 NOTE — Progress Notes (Signed)
Subjective:    Patient ID: [name], female    DOB: 23-Oct-1940, 80 y.o.   MRN: [id number]  HPI The patient is here for follow up of their chronic medical problems, including htn, hyperlipidemia, anxiety, OA ( sees rheum)  Walking twice a day.    She is still dealing with joint pain.  She is following with rheumatology and needs to get some repeat blood work today for her.  Her joint pain is not consistent but varies in severity and sometimes it is 1 joint more than another joint.  She takes Tylenol as needed or ibuprofen/diclofenac as needed.  Medications and allergies reviewed with patient and updated if appropriate.  Patient Active Problem List   Diagnosis Date Noted  . Joint pain 07/21/2019  . Rash and nonspecific skin eruption 07/21/2019  . Pain in left knee 03/25/2019  . Osteopenia with high frax 01/16/2019  . Anxiety 12/01/2018  . Hypertension 07/23/2018  . Family history of diabetes mellitus (DM) 07/20/2018  . 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 Personal history of other malignant neoplasm of skin 03/05/2017  . 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 Osteoarthritis 03/21/2016  . Vitamin D deficiency 02/06/2010  . VERTIGO 07/25/2009  . DIVERTICULOSIS, COLON 10/21/2008  . PALPITATIONS, OCCASIONAL 10/21/2008  . History of colonic polyps 10/31/2006  . Dyslipidemia 07/01/2006    Current Outpatient Medications on File Prior to Visit  Medication Sig Dispense Refill  . amLODipine (NORVASC) 5 MG tablet TAKE 1 TABLET ONCE DAILY. 90 tablet 2  . calcium-vitamin D (OSCAL WITH D) 500-200 MG-UNIT tablet Calcium 500 + D (D3)  1 po qd otc    . desoximetasone (TOPICORT) 0.25 % cream Apply 1 application topically 2 (two) times daily.    . diclofenac (VOLTAREN) 75 MG EC tablet Take 1 tablet (75 mg total) by mouth 2 (two) times daily as needed (for knee inflammation). 30 tablet 0  . escitalopram (LEXAPRO) 5 MG tablet TAKE 1 TABLET ONCE DAILY. 90 tablet 0   . lisinopril (ZESTRIL) 20 MG tablet TAKE 1 TABLET ONCE DAILY. 90 tablet 0  . rosuvastatin (CRESTOR) 5 MG tablet TAKE 1 TABLET 3 TIMES PER WEEK. 39 tablet 0   No current facility-administered medications on file prior to visit.    Past Medical History:  Diagnosis Date  . Cancer ([place])    skin  cancer Nose  . COLONIC POLYPS, HX OF 10/31/2006  . DIVERTICULOSIS, COLON 10/21/2008  . ELEVATED BP READING WITHOUT DX HYPERTENSION 07/12/2009  . HYPERLIPIDEMIA 07/01/2006  . Hypertension   . PALPITATIONS, OCCASIONAL 10/21/2008  . VERTIGO 07/25/2009    Past Surgical History:  Procedure Laterality Date  . ABDOMINAL HYSTERECTOMY    . COLONOSCOPY  02/24/2013  . LAMINECTOMY     lumbar  . ROTATOR CUFF REPAIR      Social History   Socioeconomic History  . Marital status: Married    Spouse name: Not on file  . Number of children: Not on file  . Years of education: Not on file  . Highest education level: Not on file  Occupational History  . Not on file  Tobacco Use  . Smoking status: Never Smoker  . Smokeless tobacco: Never Used  Vaping Use  . Vaping Use: Never used  Substance and Sexual Activity  . Alcohol use: Yes    Comment: wine occ  . Drug use: No  . Sexual activity: Not Currently  Other Topics Concern  . Not on file  Social History Narrative  . Not on file   Social Determinants of Health   Financial Resource Strain: Low Risk   . Difficulty of Paying Living Expenses: Not hard at all  Food Insecurity: No Food Insecurity  . Worried About Charity fundraiser in the Last Year: Never true  . Ran Out of Food in the Last Year: Never true  Transportation Needs: No Transportation Needs  . Lack of Transportation (Medical): No  . Lack of Transportation (Non-Medical): No  Physical Activity: Sufficiently Active  . Days of Exercise per Week: 7 days  . Minutes of Exercise per Session: 60 min  Stress: No Stress Concern Present  . Feeling of Stress : Not at all  Social Connections:  Moderately Integrated  . Frequency of Communication with Friends and Family: More than three times a week  . Frequency of Social Gatherings with Friends and Family: More than three times a week  . Attends Religious Services: More than 4 times per year  . Active Member of Clubs or Organizations: Yes  . Attends Archivist Meetings: More than 4 times per year  . Marital Status: Widowed    Family History  Problem Relation Age of Onset  . Diabetes Sister   . Diabetes Brother   . Melanoma Brother   . Colon polyps Brother   . Diabetes Mother   . COPD Mother   . Hypertension Father   . Colon cancer Neg Hx   . Pancreatic cancer Neg Hx   . Stomach cancer Neg Hx   . Esophageal cancer Neg Hx   . Rectal cancer Neg Hx     Review of Systems  Constitutional: Negative for fever.  Respiratory: Negative for cough, shortness of breath and wheezing.   Cardiovascular: Negative for chest pain, palpitations and leg swelling.  Musculoskeletal: Positive for arthralgias.  Neurological: Negative for light-headedness and headaches.       Objective:   Vitals:   03/23/20 1042  BP: 122/78  Pulse: 67  Temp: 98 F (36.7 C)  SpO2: 95%   BP Readings from Last 3 Encounters:  03/23/20 122/78  07/21/19 122/72  06/07/19 112/60   Wt Readings from Last 3 Encounters:  03/23/20 171 lb (77.6 kg)  07/21/19 170 lb (77.1 kg)  06/07/19 170 lb (77.1 kg)   Body mass index is 26.78 kg/m.   Physical Exam    Constitutional: Appears well-developed and well-nourished. No distress.  HENT:  Head: Normocephalic and atraumatic.  Neck: Neck supple. No tracheal deviation present. No thyromegaly present.  No cervical lymphadenopathy Cardiovascular: Normal rate, regular rhythm and normal heart sounds.   No murmur heard. No carotid bruit .  No edema Pulmonary/Chest: Effort normal and breath sounds normal. No respiratory distress. No has no wheezes. No rales.  Skin: Skin is warm and dry. Not diaphoretic.   Psychiatric: Normal mood and affect. Behavior is normal.      Assessment & Plan:    See Problem List for Assessment and Plan of chronic medical problems.    This visit occurred during the SARS-CoV-2 public health emergency.  Safety protocols were in place, including screening questions prior to the visit, additional usage of staff PPE, and extensive cleaning of exam room while observing appropriate contact time as indicated for disinfecting solutions.

## 2020-03-24 LAB — CYCLIC CITRUL PEPTIDE ANTIBODY, IGG: Cyclic Citrullin Peptide Ab: <16 UNITS

## 2020-03-24 LAB — RHEUMATOID FACTOR: Rheumatoid fact SerPl-aCnc: 18 IU/mL — ABNORMAL HIGH (ref <14)

## 2020-04-09 ENCOUNTER — Other Ambulatory Visit: Payer: Self-pay | Admitting: Internal Medicine

## 2020-04-17 ENCOUNTER — Other Ambulatory Visit: Payer: Self-pay | Admitting: Internal Medicine

## 2020-04-17 DIAGNOSIS — Z20822 Contact with and (suspected) exposure to covid-19: Secondary | ICD-10-CM | POA: Diagnosis not present

## 2020-05-06 ENCOUNTER — Encounter: Payer: Self-pay | Admitting: Internal Medicine

## 2020-05-22 ENCOUNTER — Other Ambulatory Visit: Payer: Self-pay | Admitting: Internal Medicine

## 2020-06-08 ENCOUNTER — Other Ambulatory Visit: Payer: Self-pay

## 2020-06-08 NOTE — Patient Instructions (Addendum)
Blood work was ordered.     Medications changes include :  none   Your prescription(s) have been submitted to your pharmacy. Please take as directed and contact our office if you believe you are having problem(s) with the medication(s).    Please followup in 6 months    Health Maintenance, Female Adopting a healthy lifestyle and getting preventive care are important in promoting health and wellness. Ask your health care provider about:  The right schedule for you to have regular tests and exams.  Things you can do on your own to prevent diseases and keep yourself healthy. What should I know about diet, weight, and exercise? Eat a healthy diet  Eat a diet that includes plenty of vegetables, fruits, low-fat dairy products, and lean protein.  Do not eat a lot of foods that are high in solid fats, added sugars, or sodium.   Maintain a healthy weight Body mass index (BMI) is used to identify weight problems. It estimates body fat based on height and weight. Your health care provider can help determine your BMI and help you achieve or maintain a healthy weight. Get regular exercise Get regular exercise. This is one of the most important things you can do for your health. Most adults should:  Exercise for at least 150 minutes each week. The exercise should increase your heart rate and make you sweat (moderate-intensity exercise).  Do strengthening exercises at least twice a week. This is in addition to the moderate-intensity exercise.  Spend less time sitting. Even light physical activity can be beneficial. Watch cholesterol and blood lipids Have your blood tested for lipids and cholesterol at 78 years of age, then have this test every 5 years. Have your cholesterol levels checked more often if:  Your lipid or cholesterol levels are high.  You are older than 78 years of age.  You are at high risk for heart disease. What should I know about cancer screening? Depending on your  health history and family history, you may need to have cancer screening at various ages. This may include screening for:  Breast cancer.  Cervical cancer.  Colorectal cancer.  Skin cancer.  Lung cancer. What should I know about heart disease, diabetes, and high blood pressure? Blood pressure and heart disease  High blood pressure causes heart disease and increases the risk of stroke. This is more likely to develop in people who have high blood pressure readings, are of African descent, or are overweight.  Have your blood pressure checked: ? Every 3-5 years if you are 18-39 years of age. ? Every year if you are 40 years old or older. Diabetes Have regular diabetes screenings. This checks your fasting blood sugar level. Have the screening done:  Once every three years after age 40 if you are at a normal weight and have a low risk for diabetes.  More often and at a younger age if you are overweight or have a high risk for diabetes. What should I know about preventing infection? Hepatitis B If you have a higher risk for hepatitis B, you should be screened for this virus. Talk with your health care provider to find out if you are at risk for hepatitis B infection. Hepatitis C Testing is recommended for:  Everyone born from 1945 through 1965.  Anyone with known risk factors for hepatitis C. Sexually transmitted infections (STIs)  Get screened for STIs, including gonorrhea and chlamydia, if: ? You are sexually active and are younger than 78 years of   age. ? You are older than 78 years of age and your health care provider tells you that you are at risk for this type of infection. ? Your sexual activity has changed since you were last screened, and you are at increased risk for chlamydia or gonorrhea. Ask your health care provider if you are at risk.  Ask your health care provider about whether you are at high risk for HIV. Your health care provider may recommend a prescription  medicine to help prevent HIV infection. If you choose to take medicine to prevent HIV, you should first get tested for HIV. You should then be tested every 3 months for as long as you are taking the medicine. Pregnancy  If you are about to stop having your period (premenopausal) and you may become pregnant, seek counseling before you get pregnant.  Take 400 to 800 micrograms (mcg) of folic acid every day if you become pregnant.  Ask for birth control (contraception) if you want to prevent pregnancy. Osteoporosis and menopause Osteoporosis is a disease in which the bones lose minerals and strength with aging. This can result in bone fractures. If you are 65 years old or older, or if you are at risk for osteoporosis and fractures, ask your health care provider if you should:  Be screened for bone loss.  Take a calcium or vitamin D supplement to lower your risk of fractures.  Be given hormone replacement therapy (HRT) to treat symptoms of menopause. Follow these instructions at home: Lifestyle  Do not use any products that contain nicotine or tobacco, such as cigarettes, e-cigarettes, and chewing tobacco. If you need help quitting, ask your health care provider.  Do not use street drugs.  Do not share needles.  Ask your health care provider for help if you need support or information about quitting drugs. Alcohol use  Do not drink alcohol if: ? Your health care provider tells you not to drink. ? You are pregnant, may be pregnant, or are planning to become pregnant.  If you drink alcohol: ? Limit how much you use to 0-1 drink a day. ? Limit intake if you are breastfeeding.  Be aware of how much alcohol is in your drink. In the [place] one drink equals one 12 oz bottle of beer (355 mL), one 5 oz glass of wine (148 mL), or one 1 oz glass of hard liquor (44 mL). General instructions  Schedule regular health, dental, and eye exams.  Stay current with your vaccines.  Tell your health  care provider if: ? You often feel depressed. ? You have ever been abused or do not feel safe at home. Summary  Adopting a healthy lifestyle and getting preventive care are important in promoting health and wellness.  Follow your health care provider's instructions about healthy diet, exercising, and getting tested or screened for diseases.  Follow your health care provider's instructions on monitoring your cholesterol and blood pressure. This information is not intended to replace advice given to you by your health care provider. Make sure you discuss any questions you have with your health care provider. Document Revised: 12/31/2017 Document Reviewed: 12/31/2017 Elsevier Patient Education  2021 Elsevier Inc.  

## 2020-06-08 NOTE — Progress Notes (Signed)
Subjective:    Patient ID: [name], female    DOB: 04/04/1940, 80 y.o.   MRN: [id number]   This visit occurred during the SARS-CoV-2 public health emergency.  Safety protocols were in place, including screening questions prior to the visit, additional usage of staff PPE, and extensive cleaning of exam room while observing appropriate contact time as indicated for disinfecting solutions.    HPI She is here for a physical exam.   She wants to lose weight.  She plans on joining [employer], which has worked for her in the past. She is walking.    She feels good.    Medications and allergies reviewed with patient and updated if appropriate.  Patient Active Problem List   Diagnosis Date Noted  . Joint pain 07/21/2019  . Rash and nonspecific skin eruption 07/21/2019  . Pain in left knee 03/25/2019  . Osteopenia with high frax 01/16/2019  . Anxiety 12/01/2018  . Hypertension 07/23/2018  . Family history of diabetes mellitus (DM) 07/20/2018  . 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 Personal history of other malignant neoplasm of skin 03/05/2017  . 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 Osteoarthritis 03/21/2016  . Vitamin D deficiency 02/06/2010  . VERTIGO 07/25/2009  . DIVERTICULOSIS, COLON 10/21/2008  . PALPITATIONS, OCCASIONAL 10/21/2008  . History of colonic polyps 10/31/2006  . Dyslipidemia 07/01/2006    Current Outpatient Medications on File Prior to Visit  Medication Sig Dispense Refill  . amLODipine (NORVASC) 5 MG tablet TAKE 1 TABLET ONCE DAILY. 90 tablet 2  . calcium-vitamin D (OSCAL WITH D) 500-200 MG-UNIT tablet Calcium 500 + D (D3)  1 po qd otc    . desoximetasone (TOPICORT) 0.25 % cream Apply 1 application topically 2 (two) times daily.    Marland Kitchen escitalopram (LEXAPRO) 5 MG tablet TAKE 1 TABLET ONCE DAILY. 90 tablet 0  . lisinopril (ZESTRIL) 20 MG tablet TAKE 1 TABLET ONCE DAILY. 90 tablet 0  . rosuvastatin (CRESTOR) 5 MG tablet TAKE 1 TABLET 3  TIMES PER WEEK. 39 tablet 0   No current facility-administered medications on file prior to visit.    Past Medical History:  Diagnosis Date  . Cancer ([place])    skin  cancer Nose  . COLONIC POLYPS, HX OF 10/31/2006  . DIVERTICULOSIS, COLON 10/21/2008  . ELEVATED BP READING WITHOUT DX HYPERTENSION 07/12/2009  . HYPERLIPIDEMIA 07/01/2006  . Hypertension   . PALPITATIONS, OCCASIONAL 10/21/2008  . VERTIGO 07/25/2009    Past Surgical History:  Procedure Laterality Date  . ABDOMINAL HYSTERECTOMY    . COLONOSCOPY  02/24/2013  . LAMINECTOMY     lumbar  . ROTATOR CUFF REPAIR      Social History   Socioeconomic History  . Marital status: Married    Spouse name: Not on file  . Number of children: Not on file  . Years of education: Not on file  . Highest education level: Not on file  Occupational History  . Not on file  Tobacco Use  . Smoking status: Never Smoker  . Smokeless tobacco: Never Used  Vaping Use  . Vaping Use: Never used  Substance and Sexual Activity  . Alcohol use: Yes    Comment: wine occ  . Drug use: No  . Sexual activity: Not Currently  Other Topics Concern  . Not on file  Social History Narrative  . Not on file   Social Determinants of Health   Financial Resource Strain: Low Risk   . Difficulty  of Paying Living Expenses: Not hard at all  Food Insecurity: No Food Insecurity  . Worried About Charity fundraiser in the Last Year: Never true  . Ran Out of Food in the Last Year: Never true  Transportation Needs: No Transportation Needs  . Lack of Transportation (Medical): No  . Lack of Transportation (Non-Medical): No  Physical Activity: Sufficiently Active  . Days of Exercise per Week: 7 days  . Minutes of Exercise per Session: 60 min  Stress: No Stress Concern Present  . Feeling of Stress : Not at all  Social Connections: Moderately Integrated  . Frequency of Communication with Friends and Family: More than three times a week  . Frequency of Social  Gatherings with Friends and Family: More than three times a week  . Attends Religious Services: More than 4 times per year  . Active Member of Clubs or Organizations: Yes  . Attends Archivist Meetings: More than 4 times per year  . Marital Status: Widowed    Family History  Problem Relation Age of Onset  . Diabetes Sister   . Diabetes Brother   . Melanoma Brother   . Colon polyps Brother   . Diabetes Mother   . COPD Mother   . Hypertension Father   . Colon cancer Neg Hx   . Pancreatic cancer Neg Hx   . Stomach cancer Neg Hx   . Esophageal cancer Neg Hx   . Rectal cancer Neg Hx     Review of Systems  Constitutional: Negative for chills and fever.  Eyes: Negative for visual disturbance.  Respiratory: Negative for cough, shortness of breath and wheezing.   Cardiovascular: Negative for chest pain, palpitations and leg swelling.  Gastrointestinal: Negative for abdominal pain, blood in stool, constipation, diarrhea and nausea.       Occ gerd  Genitourinary: Negative for dysuria.  Musculoskeletal: Positive for arthralgias. Negative for back pain.  Skin: Negative for color change and rash.  Neurological: Negative for dizziness, light-headedness and headaches.  Psychiatric/Behavioral: Negative for dysphoric mood. The patient is not nervous/anxious.        Objective:   Vitals:   06/09/20 0751  BP: 118/74  Pulse: 68  Temp: 98 F (36.7 C)  SpO2: 95%   Filed Weights   06/09/20 0751  Weight: 176 lb 6.4 oz (80 kg)   Body mass index is 27.63 kg/m.  BP Readings from Last 3 Encounters:  06/09/20 118/74  03/23/20 122/78  07/21/19 122/72    Wt Readings from Last 3 Encounters:  06/09/20 176 lb 6.4 oz (80 kg)  03/23/20 171 lb (77.6 kg)  07/21/19 170 lb (77.1 kg)     Physical Exam Constitutional: She appears well-developed and well-nourished. No distress.  HENT:  Head: Normocephalic and atraumatic.  Right Ear: External ear normal. Normal ear canal and  TM Left Ear: External ear normal.  Normal ear canal and TM Mouth/Throat: Oropharynx is clear and moist.  Eyes: Conjunctivae and EOM are normal.  Neck: Neck supple. No tracheal deviation present. No thyromegaly present.  No carotid bruit  Cardiovascular: Normal rate, regular rhythm and normal heart sounds.   No murmur heard.  No edema. Pulmonary/Chest: Effort normal and breath sounds normal. No respiratory distress. She has no wheezes. She has no rales.  Breast: deferred   Abdominal: Soft. She exhibits no distension. There is no tenderness.  Lymphadenopathy: She has no cervical adenopathy.  Skin: Skin is warm and dry. She is not diaphoretic.  Psychiatric: She  has a normal mood and affect. Her behavior is normal.        Assessment & Plan:   Physical exam: Screening blood work    ordered Immunizations  Discussed td, covid booster Colonoscopy  Up to date  Mammogram  Due - will schedule Gyn  n/a Dexa  Up to date  Eye exams  Up to date  Exercise  walking Weight  Good for age Substance abuse  none See derm Q 6 month     See Problem List for Assessment and Plan of chronic medical problems.

## 2020-06-09 ENCOUNTER — Ambulatory Visit (INDEPENDENT_AMBULATORY_CARE_PROVIDER_SITE_OTHER): Payer: Medicare PPO | Admitting: Internal Medicine

## 2020-06-09 ENCOUNTER — Encounter: Payer: Self-pay | Admitting: Internal Medicine

## 2020-06-09 VITALS — BP 118/74 | HR 68 | Temp 98.0°F | Ht 67.0 in | Wt 176.4 lb

## 2020-06-09 DIAGNOSIS — I10 Essential (primary) hypertension: Secondary | ICD-10-CM

## 2020-06-09 DIAGNOSIS — Z0000 Encounter for general adult medical examination without abnormal findings: Secondary | ICD-10-CM

## 2020-06-09 DIAGNOSIS — E559 Vitamin D deficiency, unspecified: Secondary | ICD-10-CM

## 2020-06-09 DIAGNOSIS — E785 Hyperlipidemia, unspecified: Secondary | ICD-10-CM | POA: Diagnosis not present

## 2020-06-09 DIAGNOSIS — F419 Anxiety disorder, unspecified: Secondary | ICD-10-CM

## 2020-06-09 DIAGNOSIS — M85851 Other specified disorders of bone density and structure, right thigh: Secondary | ICD-10-CM | POA: Diagnosis not present

## 2020-06-09 LAB — CBC WITH DIFFERENTIAL/PLATELET
Basophils Absolute: 0 10*3/uL (ref 0.0–0.1)
Basophils Relative: 1.3 % (ref 0.0–3.0)
Eosinophils Absolute: 0.2 10*3/uL (ref 0.0–0.7)
Eosinophils Relative: 4.7 % (ref 0.0–5.0)
HCT: 37.8 % (ref 36.0–46.0)
Hemoglobin: 12.7 g/dL (ref 12.0–15.0)
Lymphocytes Relative: 28.3 % (ref 12.0–46.0)
Lymphs Abs: 1 10*3/uL (ref 0.7–4.0)
MCHC: 33.7 g/dL (ref 30.0–36.0)
MCV: 92.1 fl (ref 78.0–100.0)
Monocytes Absolute: 0.4 10*3/uL (ref 0.1–1.0)
Monocytes Relative: 10.4 % (ref 3.0–12.0)
Neutro Abs: 2 10*3/uL (ref 1.4–7.7)
Neutrophils Relative %: 55.3 % (ref 43.0–77.0)
Platelets: 266 10*3/uL (ref 150.0–400.0)
RBC: 4.11 Mil/uL (ref 3.87–5.11)
RDW: 15.3 % (ref 11.5–15.5)
WBC: 3.6 10*3/uL — ABNORMAL LOW (ref 4.0–10.5)

## 2020-06-09 LAB — LIPID PANEL
Cholesterol: 236 mg/dL — ABNORMAL HIGH (ref 0–200)
HDL: 95.4 mg/dL (ref >39.00)
LDL Cholesterol: 132 mg/dL — ABNORMAL HIGH (ref 0–99)
NonHDL: 141.04
Total CHOL/HDL Ratio: 2
Triglycerides: 45 mg/dL (ref 0.0–149.0)
VLDL: 9 mg/dL (ref 0.0–40.0)

## 2020-06-09 LAB — COMPREHENSIVE METABOLIC PANEL
ALT: 19 U/L (ref 0–35)
AST: 17 U/L (ref 0–37)
Albumin: 4.1 g/dL (ref 3.5–5.2)
Alkaline Phosphatase: 59 U/L (ref 39–117)
BUN: 18 mg/dL (ref 6–23)
CO2: 30 mEq/L (ref 19–32)
Calcium: 9.2 mg/dL (ref 8.4–10.5)
Chloride: 106 mEq/L (ref 96–112)
Creatinine, Ser: 0.59 mg/dL (ref 0.40–1.20)
GFR: 86.43 mL/min (ref >60.00)
Glucose, Bld: 81 mg/dL (ref 70–99)
Potassium: 4.4 mEq/L (ref 3.5–5.1)
Sodium: 142 mEq/L (ref 135–145)
Total Bilirubin: 0.6 mg/dL (ref 0.2–1.2)
Total Protein: 7.2 g/dL (ref 6.0–8.3)

## 2020-06-09 LAB — VITAMIN D 25 HYDROXY (VIT D DEFICIENCY, FRACTURES): VITD: 25.66 ng/mL — ABNORMAL LOW (ref 30.00–100.00)

## 2020-06-09 LAB — TSH: TSH: 4.72 u[IU]/mL — ABNORMAL HIGH (ref 0.35–4.50)

## 2020-06-09 MED ORDER — LISINOPRIL 20 MG PO TABS: 1.0000 | ORAL_TABLET | Freq: Every day | ORAL | 1 refills | Status: DC

## 2020-06-09 MED ORDER — AMLODIPINE BESYLATE 5 MG PO TABS: 1.0000 | ORAL_TABLET | Freq: Every day | ORAL | 1 refills | Status: DC

## 2020-06-09 MED ORDER — ESCITALOPRAM OXALATE 5 MG PO TABS: 5.0000 mg | ORAL_TABLET | Freq: Every day | ORAL | 1 refills | Status: DC

## 2020-06-09 MED ORDER — ROSUVASTATIN CALCIUM 5 MG PO TABS: ORAL_TABLET | ORAL | 1 refills | Status: DC

## 2020-06-09 NOTE — Assessment & Plan Note (Signed)
Chronic Controlled, stable Continue lexapro 5 mg qd Cmp, tsh

## 2020-06-09 NOTE — Assessment & Plan Note (Signed)
Chronic ?Check lipid panel  ?Continue crestor 5 mg TIW ?Regular exercise and healthy diet encouraged ? ?

## 2020-06-09 NOTE — Assessment & Plan Note (Signed)
Chronic Taking vitamin D daily Check vitamin D level  

## 2020-06-09 NOTE — Assessment & Plan Note (Signed)
Chronic BP well controlled Continue amlodipine 5 mg qd, lisinopril 20 mg qd cmp

## 2020-06-09 NOTE — Assessment & Plan Note (Addendum)
Chronic dexa up to date Walking regularly Continue calcium and vitamin d daily Check cmp, vit d level, tsh, cbc

## 2020-06-21 DIAGNOSIS — M8949 Other hypertrophic osteoarthropathy, multiple sites: Secondary | ICD-10-CM | POA: Diagnosis not present

## 2020-06-21 DIAGNOSIS — R768 Other specified abnormal immunological findings in serum: Secondary | ICD-10-CM | POA: Diagnosis not present

## 2020-06-21 DIAGNOSIS — M25562 Pain in left knee: Secondary | ICD-10-CM | POA: Diagnosis not present

## 2020-06-21 DIAGNOSIS — E559 Vitamin D deficiency, unspecified: Secondary | ICD-10-CM | POA: Diagnosis not present

## 2020-06-21 DIAGNOSIS — R5382 Chronic fatigue, unspecified: Secondary | ICD-10-CM | POA: Diagnosis not present

## 2020-06-21 DIAGNOSIS — E785 Hyperlipidemia, unspecified: Secondary | ICD-10-CM | POA: Diagnosis not present

## 2020-06-21 DIAGNOSIS — I10 Essential (primary) hypertension: Secondary | ICD-10-CM | POA: Diagnosis not present

## 2020-06-21 DIAGNOSIS — Z85828 Personal history of other malignant neoplasm of skin: Secondary | ICD-10-CM | POA: Diagnosis not present

## 2020-06-21 DIAGNOSIS — M11231 Other chondrocalcinosis, right wrist: Secondary | ICD-10-CM | POA: Diagnosis not present

## 2020-07-18 DIAGNOSIS — H43813 Vitreous degeneration, bilateral: Secondary | ICD-10-CM | POA: Diagnosis not present

## 2020-07-18 DIAGNOSIS — H35371 Puckering of macula, right eye: Secondary | ICD-10-CM | POA: Diagnosis not present

## 2020-07-18 DIAGNOSIS — H02831 Dermatochalasis of right upper eyelid: Secondary | ICD-10-CM | POA: Diagnosis not present

## 2020-07-18 DIAGNOSIS — H02834 Dermatochalasis of left upper eyelid: Secondary | ICD-10-CM | POA: Diagnosis not present

## 2020-07-27 DIAGNOSIS — Z1231 Encounter for screening mammogram for malignant neoplasm of breast: Secondary | ICD-10-CM | POA: Diagnosis not present

## 2020-09-05 DIAGNOSIS — B351 Tinea unguium: Secondary | ICD-10-CM | POA: Diagnosis not present

## 2020-09-05 DIAGNOSIS — Z08 Encounter for follow-up examination after completed treatment for malignant neoplasm: Secondary | ICD-10-CM | POA: Diagnosis not present

## 2020-09-05 DIAGNOSIS — L821 Other seborrheic keratosis: Secondary | ICD-10-CM | POA: Diagnosis not present

## 2020-09-05 DIAGNOSIS — L7211 Pilar cyst: Secondary | ICD-10-CM | POA: Diagnosis not present

## 2020-09-05 DIAGNOSIS — Z85828 Personal history of other malignant neoplasm of skin: Secondary | ICD-10-CM | POA: Diagnosis not present

## 2020-09-05 DIAGNOSIS — L720 Epidermal cyst: Secondary | ICD-10-CM | POA: Diagnosis not present

## 2020-09-05 DIAGNOSIS — D225 Melanocytic nevi of trunk: Secondary | ICD-10-CM | POA: Diagnosis not present

## 2020-09-05 DIAGNOSIS — Z7189 Other specified counseling: Secondary | ICD-10-CM | POA: Diagnosis not present

## 2020-09-05 DIAGNOSIS — D2271 Melanocytic nevi of right lower limb, including hip: Secondary | ICD-10-CM | POA: Diagnosis not present

## 2020-12-25 ENCOUNTER — Encounter: Payer: Self-pay | Admitting: Internal Medicine

## 2020-12-27 ENCOUNTER — Encounter: Payer: Self-pay | Admitting: Internal Medicine

## 2020-12-28 ENCOUNTER — Other Ambulatory Visit: Payer: Medicare PPO

## 2020-12-28 ENCOUNTER — Other Ambulatory Visit: Payer: Self-pay

## 2020-12-28 DIAGNOSIS — R300 Dysuria: Secondary | ICD-10-CM

## 2020-12-28 NOTE — Telephone Encounter (Signed)
Patient calling in  Says she is experiencing UTI symptoms  Wants to know if provider can place order to have urine sample tested & patient will bring sample by lab  Please call patient [phone]

## 2020-12-29 ENCOUNTER — Ambulatory Visit: Payer: Medicare PPO

## 2020-12-29 LAB — URINALYSIS, ROUTINE W REFLEX MICROSCOPIC
Bilirubin Urine: NEGATIVE
Hgb urine dipstick: NEGATIVE
Ketones, ur: NEGATIVE
Leukocytes,Ua: NEGATIVE
Nitrite: NEGATIVE
Specific Gravity, Urine: 1.01 (ref 1.000–1.030)
Total Protein, Urine: NEGATIVE
Urine Glucose: NEGATIVE
Urobilinogen, UA: 0.2 (ref 0.0–1.0)
pH: 6 (ref 5.0–8.0)

## 2020-12-29 NOTE — Telephone Encounter (Signed)
Dr. Quay Burow this pt think she has a UTI and want to discuss her BP w/ you. You dpn't have any opening until next Friday. Can she come in to give a UA and I will make appt for next Friday to discuss BP.Marland KitchenJohny Chess

## 2021-01-01 LAB — CULTURE, URINE COMPREHENSIVE

## 2021-01-08 NOTE — Progress Notes (Signed)
Subjective:    Patient ID: [name], female    DOB: 1941-01-08, 80 y.o.   MRN: [id number]  This visit occurred during the SARS-CoV-2 public health emergency.  Safety protocols were in place, including screening questions prior to the visit, additional usage of staff PPE, and extensive cleaning of exam room while observing appropriate contact time as indicated for disinfecting solutions.     HPI The patient is here for follow up of their chronic medical problems, including htn, anxiety, hld, vit d def     Medications and allergies reviewed with patient and updated if appropriate.  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 90 tablet 1   calcium-vitamin D (OSCAL WITH D) 500-200 MG-UNIT tablet Calcium 500 + D (D3)  1 po qd otc     desoximetasone (TOPICORT) 0.25 % cream Apply 1 application topically 2 (two) times daily.     lisinopril (ZESTRIL) 20 MG tablet Take 1 tablet (20 mg total) by mouth daily. (Patient not taking: Reported on 02/15/2021) 90 tablet 1   No current facility-administered medications on file prior to visit.    Past Medical History:  Diagnosis Date    Cancer ([place])    skin  cancer Nose   COLONIC POLYPS, HX OF 10/31/2006   DIVERTICULOSIS, COLON 10/21/2008   ELEVATED BP READING WITHOUT DX HYPERTENSION 07/12/2009   HYPERLIPIDEMIA 07/01/2006   Hypertension    PALPITATIONS, OCCASIONAL 10/21/2008   VERTIGO 07/25/2009    Past Surgical History:  Procedure Laterality Date   ABDOMINAL HYSTERECTOMY     COLONOSCOPY  02/24/2013   LAMINECTOMY     lumbar   ROTATOR CUFF REPAIR      Social History   Socioeconomic History   Marital status: Widowed    Spouse name: Not on file   Number of children: Not on file   Years of education: Not on file   Highest education level: Not on file  Occupational History   Not on file  Tobacco Use   Smoking status: Never   Smokeless tobacco: Never  Vaping Use   Vaping Use: Never used  Substance and Sexual Activity   Alcohol use: Yes    Comment: wine occ   Drug use: No   Sexual activity: Not Currently  Other Topics Concern   Not on file  Social History Narrative   Not on file   Social Determinants of Health   Financial Resource Strain: Not on file  Food Insecurity: Not on file  Transportation Needs: Not on file  Physical Activity: Not on file  Stress: Not on file  Social Connections: Not on file    Family History  Problem Relation Age of Onset  Diabetes Sister    Diabetes Brother    Melanoma Brother    Colon polyps Brother    Diabetes Mother    COPD Mother    Hypertension Father    Colon cancer Neg Hx    Pancreatic cancer Neg Hx    Stomach cancer Neg Hx    Esophageal cancer Neg Hx    Rectal cancer Neg Hx     Review of Systems     Objective:  There were no vitals filed for this visit. BP Readings from Last 3 Encounters:  06/09/20 118/74  03/23/20 122/78  07/21/19 122/72   Wt Readings from Last 3 Encounters:  06/09/20 176 lb 6.4 oz (80 kg)  03/23/20 171 lb (77.6 kg)  07/21/19 170 lb (77.1 kg)   There is no height or weight on file to  calculate BMI.   Physical Exam    Constitutional: Appears well-developed and well-nourished. No distress.  HENT:  Head: Normocephalic and atraumatic.  Neck: Neck supple. No tracheal deviation present. No thyromegaly present.  No cervical lymphadenopathy Cardiovascular: Normal rate, regular rhythm and normal heart sounds.   No murmur heard. No carotid bruit .  No edema Pulmonary/Chest: Effort normal and breath sounds normal. No respiratory distress. No has no wheezes. No rales.  Skin: Skin is warm and dry. Not diaphoretic.  Psychiatric: Normal mood and affect. Behavior is normal.      Assessment & Plan:    See Problem List for Assessment and Plan of chronic medical problems.     This encounter was created in error - please disregard.

## 2021-01-08 NOTE — Patient Instructions (Signed)
  Blood work was ordered.     Medications changes include :     Your prescription(s) have been submitted to your pharmacy. Please take as directed and contact our office if you believe you are having problem(s) with the medication(s).   A referral was ordered for        Someone from their office will call you to schedule an appointment.    Please followup in 6 months  

## 2021-01-09 ENCOUNTER — Encounter: Payer: Medicare PPO | Admitting: Internal Medicine

## 2021-01-25 DIAGNOSIS — R768 Other specified abnormal immunological findings in serum: Secondary | ICD-10-CM | POA: Diagnosis not present

## 2021-01-25 DIAGNOSIS — M159 Polyosteoarthritis, unspecified: Secondary | ICD-10-CM | POA: Diagnosis not present

## 2021-01-25 DIAGNOSIS — I10 Essential (primary) hypertension: Secondary | ICD-10-CM | POA: Diagnosis not present

## 2021-01-25 DIAGNOSIS — M25542 Pain in joints of left hand: Secondary | ICD-10-CM | POA: Diagnosis not present

## 2021-01-25 DIAGNOSIS — G8929 Other chronic pain: Secondary | ICD-10-CM | POA: Diagnosis not present

## 2021-01-25 DIAGNOSIS — M11231 Other chondrocalcinosis, right wrist: Secondary | ICD-10-CM | POA: Diagnosis not present

## 2021-01-25 DIAGNOSIS — M25562 Pain in left knee: Secondary | ICD-10-CM | POA: Diagnosis not present

## 2021-01-25 DIAGNOSIS — Z9889 Other specified postprocedural states: Secondary | ICD-10-CM | POA: Diagnosis not present

## 2021-01-25 DIAGNOSIS — E559 Vitamin D deficiency, unspecified: Secondary | ICD-10-CM | POA: Diagnosis not present

## 2021-01-25 DIAGNOSIS — M25541 Pain in joints of right hand: Secondary | ICD-10-CM | POA: Diagnosis not present

## 2021-01-25 DIAGNOSIS — E785 Hyperlipidemia, unspecified: Secondary | ICD-10-CM | POA: Diagnosis not present

## 2021-01-25 DIAGNOSIS — M154 Erosive (osteo)arthritis: Secondary | ICD-10-CM | POA: Diagnosis not present

## 2021-01-29 ENCOUNTER — Other Ambulatory Visit: Payer: Self-pay | Admitting: Internal Medicine

## 2021-01-31 DIAGNOSIS — H35371 Puckering of macula, right eye: Secondary | ICD-10-CM | POA: Diagnosis not present

## 2021-01-31 DIAGNOSIS — H2513 Age-related nuclear cataract, bilateral: Secondary | ICD-10-CM | POA: Diagnosis not present

## 2021-01-31 DIAGNOSIS — Z79899 Other long term (current) drug therapy: Secondary | ICD-10-CM | POA: Diagnosis not present

## 2021-01-31 DIAGNOSIS — H43813 Vitreous degeneration, bilateral: Secondary | ICD-10-CM | POA: Diagnosis not present

## 2021-02-06 DIAGNOSIS — M85852 Other specified disorders of bone density and structure, left thigh: Secondary | ICD-10-CM | POA: Diagnosis not present

## 2021-02-06 DIAGNOSIS — M85851 Other specified disorders of bone density and structure, right thigh: Secondary | ICD-10-CM | POA: Diagnosis not present

## 2021-02-06 LAB — HM DEXA SCAN

## 2021-02-15 ENCOUNTER — Ambulatory Visit (HOSPITAL_BASED_OUTPATIENT_CLINIC_OR_DEPARTMENT_OTHER): Payer: Medicare PPO | Attending: Rheumatology | Admitting: Physical Therapy

## 2021-02-15 ENCOUNTER — Encounter (HOSPITAL_BASED_OUTPATIENT_CLINIC_OR_DEPARTMENT_OTHER): Payer: Self-pay | Admitting: Physical Therapy

## 2021-02-15 ENCOUNTER — Other Ambulatory Visit: Payer: Self-pay

## 2021-02-15 DIAGNOSIS — G8929 Other chronic pain: Secondary | ICD-10-CM | POA: Insufficient documentation

## 2021-02-15 DIAGNOSIS — M25542 Pain in joints of left hand: Secondary | ICD-10-CM | POA: Insufficient documentation

## 2021-02-15 DIAGNOSIS — M154 Erosive (osteo)arthritis: Secondary | ICD-10-CM | POA: Insufficient documentation

## 2021-02-15 DIAGNOSIS — M25561 Pain in right knee: Secondary | ICD-10-CM

## 2021-02-15 DIAGNOSIS — M6281 Muscle weakness (generalized): Secondary | ICD-10-CM | POA: Diagnosis not present

## 2021-02-15 DIAGNOSIS — M25541 Pain in joints of right hand: Secondary | ICD-10-CM | POA: Diagnosis not present

## 2021-02-15 DIAGNOSIS — R262 Difficulty in walking, not elsewhere classified: Secondary | ICD-10-CM

## 2021-02-15 DIAGNOSIS — M25562 Pain in left knee: Secondary | ICD-10-CM | POA: Diagnosis not present

## 2021-02-15 DIAGNOSIS — M159 Polyosteoarthritis, unspecified: Secondary | ICD-10-CM | POA: Insufficient documentation

## 2021-02-15 DIAGNOSIS — Z9889 Other specified postprocedural states: Secondary | ICD-10-CM | POA: Insufficient documentation

## 2021-02-15 NOTE — Therapy (Signed)
OUTPATIENT PHYSICAL THERAPY LOWER EXTREMITY EVALUATION   Patient Name: [name] MRN: [id number] DOB:April 06, 1940, 81 y.o., female Today's Date: 02/15/2021   PT End of Session - 02/15/21 1347     Visit Number 1    Number of Visits 21    Date for PT Re-Evaluation 05/16/21    Authorization Type [employer] M[employer]    PT Start Time 1347    PT Stop Time 1430    PT Time Calculation (min) 43 min    Activity Tolerance Patient tolerated treatment well    Behavior During Therapy WFL for tasks assessed/performed             Past Medical History:  Diagnosis Date   Cancer ([place])    skin  cancer Nose   COLONIC POLYPS, HX OF 10/31/2006   DIVERTICULOSIS, COLON 10/21/2008   ELEVATED BP READING WITHOUT DX HYPERTENSION 07/12/2009   HYPERLIPIDEMIA 07/01/2006   Hypertension    PALPITATIONS, OCCASIONAL 10/21/2008   VERTIGO 07/25/2009   Past Surgical History:  Procedure Laterality Date   ABDOMINAL HYSTERECTOMY     COLONOSCOPY  02/24/2013   LAMINECTOMY     lumbar   ROTATOR CUFF REPAIR     Patient Active Problem List   Diagnosis Date Noted   Joint pain 07/21/2019   Rash and nonspecific skin eruption 07/21/2019   Pain in left knee 03/25/2019   Osteopenia with high frax 01/16/2019   Anxiety 12/01/2018   Hypertension 07/23/2018   Family history of diabetes mellitus (DM) 07/20/2018   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   Personal history of other malignant neoplasm of skin 03/05/2017   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   Osteoarthritis 03/21/2016   Vitamin D deficiency 02/06/2010   VERTIGO 07/25/2009   DIVERTICULOSIS, COLON 10/21/2008   PALPITATIONS, OCCASIONAL 10/21/2008   History of colonic polyps 10/31/2006   Dyslipidemia 07/01/2006    PCP: Binnie Rail, MD  REFERRING PROVIDER: Karis Juba*  REFERRING DIAG:  M15.9 (ICD-10-CM) - Polyosteoarthritis, unspecified  Z98.890 (ICD-10-CM) - Other specified postprocedural states  M25.562 (ICD-10-CM) -  Pain in left knee  G89.29 (ICD-10-CM) - Other chronic pain  M25.541 (ICD-10-CM) - Pain in joints of right hand  M25.542 (ICD-10-CM) - Pain in joints of left hand  M15.4 (ICD-10-CM) - Erosive (osteo)arthritis    THERAPY DIAG:  Pain in joint of right knee  Pain in joint of left knee  Pain in joints of right hand  Muscle weakness (generalized)  Difficulty walking  ONSET DATE: 2020  SUBJECTIVE:   SUBJECTIVE STATEMENT: Pt states her symptoms have been present for several years with gradual onset. Pt states that the inflammation will occur regularly  throughout her body. She feels she has lack of strength and trouble with getting up. She is a member of the gym here at [employer] and does the aquatic classes here. She has noticed a drastic improvement in the last 10 days. She has been on a new RA medication and she has had huge improvements so she is unsure of pool or medication influence.  She states the arthritis in the hands will be very inflamed so badly she might not be able to use that hand. Pt states MD is testing for RA in the hands. Pain and inflammation has been there for about 8 years in the wrist flexor tendons.   She states the tendons and R hand will be become very swollen and painful. Pt states her hand hurts all the time.   Pt states knee pain  has not been bad in a few months.  She is on an anti-inflammatory diet at this time as well. Pt denies NT into the hands.    She is trying to stay active (walks 30-40 minutes a day).     PERTINENT HISTORY: PMH: ANA positive, Anxiety, Diverticulosis of colon, Dyslipidemia, Fatigue, History of colon polyps, Hypertension, Osteoarthritis, Osteopenia, Palpitations, Pseudogout of right wrist, Rheumatoid factor positive, Skin cancer of nose, Vertigo, and Vitamin D deficiency. She has a past medical history of ANA positive, Anxiety, Diverticulosis of colon, Dyslipidemia, Fatigue, History of colon polyps, Hypertension, Osteoarthritis,  Osteopenia, Palpitations, Pseudogout of right wrist, Rheumatoid factor positive, Skin cancer of nose, Vertigo, and Vitamin D deficiency.  of ANA positive, Anxiety, Diverticulosis of colon, Dyslipidemia, Fatigue, History of colon polyps, Hypertension, Osteoarthritis, Osteopenia, Palpitations, Pseudogout of right wrist, Rheumatoid factor positive, Skin cancer of nose, Vertigo, and Vitamin D deficiency.   PAIN:  Are you having pain? Yes NPRS scale: 1/10 Pain location: R hand Pain orientation: Right  PAIN TYPE: aching Pain description: constant  Aggravating factors: overuse, stress,  Relieving factors: medicine, rest  PRECAUTIONS: None  WEIGHT BEARING RESTRICTIONS No  FALLS:  Has patient fallen in last 6 months? No, Number of falls: 0  LIVING ENVIRONMENT: Lives with: lives alone Lives in: House/apartment Stairs: Yes; 3 to enter, has 2 story but does not use upstairs - infrequently Has following equipment at home: None She lives alone with [employer] (lab)  OCCUPATION: retired Retail banker  PLOF: Independent  PATIENT GOALS :  pt would like to improve her strength and flexibility.    OBJECTIVE:     PATIENT SURVEYS:  FOTO unavailable  COGNITION:  Overall cognitive status: Within functional limits for tasks assessed     SENSATION:  Light touch: Deficits    POSTURE:  WFL  PALPATION: Hypersensitivity and pain in all joints of R hand; none into R or L knee  LE AROM/PROM:  A/PROM Right 02/15/2021 Left 02/15/2021  Hip flexion 105 105  Hip extension 5 5  Hip abduction    Hip adduction    Hip internal rotation WFL in seated WFL in seated   Hip external rotation Able to cross and uncross with self OP; stiff with clinician OP Able to cross and uncross with self OP; stiff with clinician OP  Knee flexion [facility]  Knee extension [facility] WFL   (Blank rows = not tested)  LE MMT:  MMT Right 02/15/2021 Left 02/15/2021  Hip flexion 4/5 4/5  Hip extension 4/5 4/5  Hip  abduction 4/5 4/5  Hip adduction 4/5 4/5  Knee flexion 4/5 4/5  Knee extension 4/5 4/5   (Blank rows = not tested)  Grip strength HHD in lbs   L 30lbs; 30; 40 R 5lbs ; 15,10   LOWER EXTREMITY SPECIAL TESTS:  Hip special tests: Trendelenburg test: negative and Anterior hip impingement test: negative  FUNCTIONAL TESTS:  5 times sit to stand: 16.5s  GAIT: Distance walked: 5ft Assistive device utilized: None Level of assistance: Complete Independence Comments: WFL    TODAY'S TREATMENT:   Exercises Seated Hip Abduction with Resistance - 1 x daily - 5 x weekly - 3 sets - 10 reps Sit to Stand Without Arm Support - 1 x daily - 5 x weekly - 2 sets - 10 reps Seated Piriformis Stretch - 2 x daily - 7 x weekly - 1 sets - 3 reps - 30 hold    PATIENT EDUCATION:  Education details: diagnosis, prognosis, self pain  management techniques, anatomy, exercise progression, DOMS expectations, muscle firing,  envelope of function, HEP, POC  Person educated: Patient Education method: Explanation, Demonstration, Tactile cues, Verbal cues, and Handouts Education comprehension: verbalized understanding, returned demonstration, verbal cues required, and tactile cues required   HOME EXERCISE PROGRAM: Access Code: [email or web address] URL: [email or web address] Date: 02/15/2021 Prepared by: Daleen Bo  ASSESSMENT:  CLINICAL IMPRESSION: Patient is a 81 y.o. female who was seen today for physical therapy evaluation and treatment for cc of R hand pain and generalized weakness.  Pt's complaints are consistent with history of significant OA and sedentary lifestyle/ deconditioning during COVID 19 quarantine. Pt's pain was not reproducible at today's session. Pt is very active and currently in a pain free period after beginning new exercise and meds. Pt largely limited by flexibility and strength at this time in LE. However, pt is very limited by R hand due to significant OA and weakness.    Objective impairments include Abnormal gait, decreased activity tolerance, decreased balance, decreased endurance, decreased mobility, difficulty walking, decreased ROM, decreased strength, hypomobility, increased muscle spasms, impaired flexibility, improper body mechanics, postural dysfunction, and pain. These impairments are limiting patient from cleaning, community activity, driving, meal prep, laundry, yard work, shopping, and exercise and recreation . Personal factors including Age, Fitness, Time since onset of injury/illness/exacerbation, and 3+ comorbidities:    are also affecting patient's functional outcome. Patient will benefit from skilled PT to address above impairments and improve overall function.  REHAB POTENTIAL: Fair    CLINICAL DECISION MAKING: Evolving/moderate complexity  EVALUATION COMPLEXITY: Moderate   GOALS:   SHORT TERM GOALS:  STG Name Target Date Goal status  1 Pt will become independent with HEP in order to demonstrate synthesis of PT education.  Baseline:  03/29/2021 INITIAL  2 Pt will be able to demonstrate ability to improve bilat hip and knee extensor strength to 5/5/ in order to demonstrate functional improvement in LE function for self-care and house hold duties.  Baseline:  03/29/2021 INITIAL  3 Pt will be able to demonstrate ability to improve R hand grip strength by at least 10% in order to demonstrate functional improvement in UE function for self-care and house hold duties.  Baseline: 03/29/2021 INITIAL   LONG TERM GOALS:   LTG Name Target Date Goal status  1 Pt  will become independent with final HEP in order to demonstrate synthesis of PT education.  Baseline: 05/10/2021 INITIAL  2 Pt will be able to perform 5XSTS in under 12s  in order to demonstrate functional improvement above the cut off score for adults.  Baseline: 05/10/2021 INITIAL  3 Pt will be able to demonstrate reciprocal stair standing without pain in order to demonstrate functional  improvement in LE function for self-care and house hold duties.  Baseline: 05/10/2021 INITIAL   PLAN: PT FREQUENCY: 1x/week  PT DURATION: 12 weeks (likely d/c by 8 weeks)  PLANNED INTERVENTIONS: Therapeutic exercises, Therapeutic activity, Neuro Muscular re-education, Balance training, Gait training, Patient/Family education, Joint mobilization, Stair training, Orthotic/Fit training, DME instructions, Aquatic Therapy, Dry Needling, Electrical stimulation, Wheelchair mobility training, Cryotherapy, Moist heat, Compression bandaging, Taping, Vasopneumatic device, Traction, Ultrasound, Ionotophoresis 4mg /ml Dexamethasone, and Manual therapy  PLAN FOR NEXT SESSION: R wrist stretching, bridging, sidestepping, HS stretching, provide aquatic HEP if needed   Daleen Bo, PT 02/15/2021, 6:07 PM    Referring diagnosis?  M15.9 (ICD-10-CM) - Polyosteoarthritis, unspecified  Z98.890 (ICD-10-CM) - Other specified postprocedural states  M25.562 (ICD-10-CM) - Pain in left knee  G89.29 (ICD-10-CM) - Other chronic  pain  M25.541 (ICD-10-CM) - Pain in joints of right hand  M25.542 (ICD-10-CM) - Pain in joints of left hand  M15.4 (ICD-10-CM) - Erosive (osteo)arthritis   Treatment diagnosis? (if different than referring diagnosis) m25.561, m25.562, m25.541, m62.81, r26,2 What was this (referring dx) caused by? []  Surgery []  Fall [x]  Ongoing issue [x]  Arthritis []  Other: ____________  Laterality: []  Rt []  Lt [x]  Both  Check all possible CPT codes:      []  97110 (Therapeutic Exercise)  []  92507 (SLP Treatment)  []  97112 (Neuro Re-ed)   []  92526 (Swallowing Treatment)   []  16384 (Gait Training)   []  [id number] (Cognitive Training, 1st 15 minutes) []  97140 (Manual Therapy)   []  53646 (Cognitive Training, each add'l 15 minutes)  []  97530 (Therapeutic Activities)  []  Other, List CPT Code ____________    []  80321 (Self Care)       [x]  All codes above (97110 - 97535)  []  97012 (Mechanical Traction)  []   97014 (E-stim Unattended)  []  97032 (E-stim manual)  [x]  97033 (Ionto)  [x]  97035 (Ultrasound)  []  97760 (Orthotic Fit) []  [id number] (Physical Performance Training) [x]  [id number] (Aquatic Therapy) []  97034 (Contrast Bath) []  [id number] (Paraffin) []  97597 (Wound Care 1st 20 sq cm) []  97598 (Wound Care each add'l 20 sq cm) [x]  97016 (Vasopneumatic Device) []  [id number] Comptroller) []  [id number] (Prosthetic Training)

## 2021-02-19 ENCOUNTER — Other Ambulatory Visit: Payer: Self-pay | Admitting: Internal Medicine

## 2021-02-20 NOTE — Telephone Encounter (Signed)
Patient is requesting a refill of the following medications: Requested Prescriptions   Pending Prescriptions Disp Refills   escitalopram (LEXAPRO) 5 MG tablet [Pharmacy Med Name: escitalopram 5 mg tablet] 90 tablet 1    Sig: TAKE ONE TABLET BY MOUTH DAILY    Date of patient request: 02/20/21  Last office visit: 01/09/21  Date of last refill: 06/09/20  Last refill amount: 90,1 Follow up time period per chart: n/a

## 2021-02-22 ENCOUNTER — Encounter: Payer: Self-pay | Admitting: Internal Medicine

## 2021-02-22 NOTE — Progress Notes (Signed)
Outside notes received. Information abstracted. Notes sent to scan.  

## 2021-02-28 ENCOUNTER — Encounter: Payer: Self-pay | Admitting: Internal Medicine

## 2021-03-01 ENCOUNTER — Other Ambulatory Visit: Payer: Self-pay

## 2021-03-01 ENCOUNTER — Ambulatory Visit (HOSPITAL_BASED_OUTPATIENT_CLINIC_OR_DEPARTMENT_OTHER): Payer: Medicare PPO | Attending: Rheumatology | Admitting: Physical Therapy

## 2021-03-01 ENCOUNTER — Encounter (HOSPITAL_BASED_OUTPATIENT_CLINIC_OR_DEPARTMENT_OTHER): Payer: Self-pay | Admitting: Physical Therapy

## 2021-03-01 DIAGNOSIS — M25562 Pain in left knee: Secondary | ICD-10-CM | POA: Diagnosis not present

## 2021-03-01 DIAGNOSIS — M25541 Pain in joints of right hand: Secondary | ICD-10-CM | POA: Diagnosis not present

## 2021-03-01 DIAGNOSIS — R262 Difficulty in walking, not elsewhere classified: Secondary | ICD-10-CM | POA: Insufficient documentation

## 2021-03-01 DIAGNOSIS — M6281 Muscle weakness (generalized): Secondary | ICD-10-CM | POA: Diagnosis not present

## 2021-03-01 DIAGNOSIS — M25561 Pain in right knee: Secondary | ICD-10-CM | POA: Insufficient documentation

## 2021-03-01 NOTE — Therapy (Signed)
OUTPATIENT PHYSICAL THERAPY LOWER EXTREMITY EVALUATION   Patient Name: [name] MRN: [id number] DOB:Nov 22, 1940, 81 y.o., female Today's Date: 03/01/2021   PT End of Session - 03/01/21 1002     Visit Number 2    Number of Visits 21    Date for PT Re-Evaluation 05/16/21    Authorization Type [employer] MCR    PT Start Time 0930    PT Stop Time 1000    PT Time Calculation (min) 30 min    Activity Tolerance Patient tolerated treatment well    Behavior During Therapy WFL for tasks assessed/performed              Past Medical History:  Diagnosis Date   Cancer ([place])    skin  cancer Nose   COLONIC POLYPS, HX OF 10/31/2006   DIVERTICULOSIS, COLON 10/21/2008   ELEVATED BP READING WITHOUT DX HYPERTENSION 07/12/2009   HYPERLIPIDEMIA 07/01/2006   Hypertension    PALPITATIONS, OCCASIONAL 10/21/2008   VERTIGO 07/25/2009   Past Surgical History:  Procedure Laterality Date   ABDOMINAL HYSTERECTOMY     COLONOSCOPY  02/24/2013   LAMINECTOMY     lumbar   ROTATOR CUFF REPAIR     Patient Active Problem List   Diagnosis Date Noted   Joint pain 07/21/2019   Rash and nonspecific skin eruption 07/21/2019   Pain in left knee 03/25/2019   Osteopenia 01/16/2019   Anxiety 12/01/2018   Hypertension 07/23/2018   Family history of diabetes mellitus (DM) 07/20/2018   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   Personal history of other malignant neoplasm of skin 03/05/2017   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   Osteoarthritis 03/21/2016   Vitamin D deficiency 02/06/2010   VERTIGO 07/25/2009   DIVERTICULOSIS, COLON 10/21/2008   PALPITATIONS, OCCASIONAL 10/21/2008   History of colonic polyps 10/31/2006   Dyslipidemia 07/01/2006    PCP: Binnie Rail, MD  REFERRING PROVIDER: Binnie Rail, MD  REFERRING DIAG:  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 Has been going to the gym 5 days a week. She states the medication is helping and no issues with HEP. Pt states pain is still well managed. She is continuing to be active and taking advantage of this decrease in her inflammation. She states the anti-inflam diet is also helping her to lose weight. ~2.5 lbs   She is trying to stay active (walks 30-40 minutes a day).     PERTINENT HISTORY: PMH: ANA positive, Anxiety, Diverticulosis of colon, Dyslipidemia, Fatigue, History of colon polyps, Hypertension, Osteoarthritis, Osteopenia, Palpitations, Pseudogout of right wrist, Rheumatoid factor positive, Skin cancer of nose, Vertigo, and Vitamin D deficiency. She has a past medical history of ANA positive, Anxiety, Diverticulosis of colon, Dyslipidemia, Fatigue, History of colon polyps, Hypertension, Osteoarthritis, Osteopenia, Palpitations, Pseudogout of right wrist, Rheumatoid factor positive, Skin cancer of nose, Vertigo, and Vitamin D deficiency.  of ANA positive, Anxiety, Diverticulosis of colon, Dyslipidemia, Fatigue,  History of colon polyps, Hypertension, Osteoarthritis, Osteopenia, Palpitations, Pseudogout of right wrist, Rheumatoid factor positive, Skin cancer of nose, Vertigo, and Vitamin D deficiency.   PAIN:  Are you having pain? No NPRS scale: 0/10 Pain location: R hand Pain orientation: Right  PAIN TYPE: aching Pain description: constant  Aggravating factors: overuse, stress,  Relieving factors: medicine, rest FALLS:  Has patient fallen in  last 6 months? No, Number of falls: 0  LIVING ENVIRONMENT: Lives with: lives alone Lives in: House/apartment Stairs: Yes; 3 to enter, has 2 story but does not use upstairs - infrequently Has following equipment at home: None She lives alone with [employer] (lab)  OCCUPATION: retired Retail banker  PLOF: Independent  PATIENT GOALS :  pt would like to improve her strength and flexibility.    OBJECTIVE:    TODAY'S TREATMENT:  Exercises Bridge with Black TB at knees 2x10 Seated Hip Abduction with Resistance - 1 x daily - 5 x weekly - 3 sets - 10 reps Sit to Stand Without Arm Support  band around knees (black)- 1 x daily - 5 x weekly - 2 sets - 10 reps Seated Piriformis Stretch - 2 x daily - 7 x weekly - 1 sets - 3 reps - 30 hold Yellow putty gripping- multiple holds and shapes 2 min Wrist flexion and extension stretching 30s 3x each direction   PATIENT EDUCATION:  Education details: self pain management techniques, anatomy, exercise progression, DOMS expectations, muscle firing,  envelope of function, HEP, POC  Person educated: Patient Education method: Explanation, Demonstration, Tactile cues, Verbal cues, and Handouts Education comprehension: verbalized understanding, returned demonstration, verbal cues required, and tactile cues required   HOME EXERCISE PROGRAM: Access Code: [email or web address] URL: [email or web address] Date: 02/15/2021 Prepared by: Daleen Bo  ASSESSMENT:  CLINICAL IMPRESSION: Pt with good retention of previous exercise in HEP and did not have adverse reaction to land based strength in conjunction with her current aquatic exercise at [facility]. Pt is continuing to do very well during this period of reduced inflammation. Pt was able to add in wrist/finger exercise at today's session without pain but does continue to have difficulty with thumb ROM due to arthritis. Pt has good understanding of self management. Plan to reduce frequency of visits at  this time as pt is very well independent with HEP and her self directed exercise. HEP updated today.   Objective impairments include Abnormal gait, decreased activity tolerance, decreased balance, decreased endurance, decreased mobility, difficulty walking, decreased ROM, decreased strength, hypomobility, increased muscle spasms, impaired flexibility, improper body mechanics, postural dysfunction, and pain. These impairments are limiting patient from cleaning, community activity, driving, meal prep, laundry, yard work, shopping, and exercise and recreation . Personal factors including Age, Fitness, Time since onset of injury/illness/exacerbation, and 3+ comorbidities:    are also affecting patient's functional outcome. Patient will benefit from skilled PT to address above impairments and improve overall function.  REHAB POTENTIAL: Fair    CLINICAL DECISION MAKING: Evolving/moderate complexity  EVALUATION COMPLEXITY: Moderate   GOALS:   SHORT TERM GOALS:  STG Name Target Date Goal status  1 Pt will become independent with HEP in order to demonstrate synthesis of PT education.  Baseline:  04/12/2021 MET  2 Pt will be able to demonstrate ability to improve bilat hip and knee extensor strength to 5/5/ in order to demonstrate functional improvement in LE function for self-care and house hold duties.  Baseline:  04/12/2021 INITIAL  3 Pt will be able to demonstrate ability to improve R hand  grip strength by at least 10% in order to demonstrate functional improvement in UE function for self-care and house hold duties.  Baseline: 04/12/2021 INITIAL   LONG TERM GOALS:   LTG Name Target Date Goal status  1 Pt  will become independent with final HEP in order to demonstrate synthesis of PT education.  Baseline: 05/24/2021 INITIAL  2 Pt will be able to perform 5XSTS in under 12s  in order to demonstrate functional improvement above the cut off score for adults.  Baseline: 05/24/2021 INITIAL  3 Pt will  be able to demonstrate reciprocal stair standing without pain in order to demonstrate functional improvement in LE function for self-care and house hold duties.  Baseline: 05/24/2021 INITIAL   PLAN: PT FREQUENCY: 1x/week  PT DURATION: 12 weeks (likely d/c by 8 weeks)  PLANNED INTERVENTIONS: Therapeutic exercises, Therapeutic activity, Neuro Muscular re-education, Balance training, Gait training, Patient/Family education, Joint mobilization, Stair training, Orthotic/Fit training, DME instructions, Aquatic Therapy, Dry Needling, Electrical stimulation, Wheelchair mobility training, Cryotherapy, Moist heat, Compression bandaging, Taping, Vasopneumatic device, Traction, Ultrasound, Ionotophoresis 35m/ml Dexamethasone, and Manual therapy  PLAN FOR NEXT SESSION: sidestepping, HS stretching, provide aquatic HEP if needed, leg press   ADaleen Bo PT 03/01/2021, 10:06 AM

## 2021-03-05 ENCOUNTER — Encounter: Payer: Self-pay | Admitting: Internal Medicine

## 2021-03-05 NOTE — Progress Notes (Signed)
Current report already abstracted

## 2021-03-16 ENCOUNTER — Encounter (HOSPITAL_BASED_OUTPATIENT_CLINIC_OR_DEPARTMENT_OTHER): Payer: Medicare PPO | Admitting: Physical Therapy

## 2021-03-23 ENCOUNTER — Encounter (HOSPITAL_BASED_OUTPATIENT_CLINIC_OR_DEPARTMENT_OTHER): Payer: Medicare PPO | Admitting: Physical Therapy

## 2021-03-26 ENCOUNTER — Ambulatory Visit (HOSPITAL_BASED_OUTPATIENT_CLINIC_OR_DEPARTMENT_OTHER): Payer: Medicare PPO | Attending: Rheumatology | Admitting: Physical Therapy

## 2021-03-26 ENCOUNTER — Other Ambulatory Visit: Payer: Self-pay

## 2021-03-26 ENCOUNTER — Encounter (HOSPITAL_BASED_OUTPATIENT_CLINIC_OR_DEPARTMENT_OTHER): Payer: Self-pay | Admitting: Physical Therapy

## 2021-03-26 DIAGNOSIS — M25561 Pain in right knee: Secondary | ICD-10-CM | POA: Insufficient documentation

## 2021-03-26 DIAGNOSIS — M25541 Pain in joints of right hand: Secondary | ICD-10-CM | POA: Insufficient documentation

## 2021-03-26 DIAGNOSIS — M6281 Muscle weakness (generalized): Secondary | ICD-10-CM | POA: Diagnosis not present

## 2021-03-26 DIAGNOSIS — M25562 Pain in left knee: Secondary | ICD-10-CM | POA: Insufficient documentation

## 2021-03-26 DIAGNOSIS — R262 Difficulty in walking, not elsewhere classified: Secondary | ICD-10-CM | POA: Insufficient documentation

## 2021-03-26 NOTE — Therapy (Signed)
OUTPATIENT PHYSICAL THERAPY DISCHARGE PHYSICAL THERAPY DISCHARGE SUMMARY  Visits from Start of Care: 3  Plan: Patient agrees to discharge.  Patient goals were mostly met. Patient is being discharged due to being satisfied with current level of function.        Patient Name: [name] MRN: [id number] DOB:1940/06/16, 81 y.o., female Today's Date: 03/26/2021   PT End of Session - 03/26/21 1603     Visit Number 3    Number of Visits 21    Date for PT Re-Evaluation 05/16/21    Authorization Type [employer] MCR    PT Start Time 1601    PT Stop Time 1625    PT Time Calculation (min) 24 min    Activity Tolerance Patient tolerated treatment well    Behavior During Therapy WFL for tasks assessed/performed               Past Medical History:  Diagnosis Date   Cancer ([place])    skin  cancer Nose   COLONIC POLYPS, HX OF 10/31/2006   DIVERTICULOSIS, COLON 10/21/2008   ELEVATED BP READING WITHOUT DX HYPERTENSION 07/12/2009   HYPERLIPIDEMIA 07/01/2006   Hypertension    PALPITATIONS, OCCASIONAL 10/21/2008   VERTIGO 07/25/2009   Past Surgical History:  Procedure Laterality Date   ABDOMINAL HYSTERECTOMY     COLONOSCOPY  02/24/2013   LAMINECTOMY     lumbar   ROTATOR CUFF REPAIR     Patient Active Problem List   Diagnosis Date Noted   Joint pain 07/21/2019   Rash and nonspecific skin eruption 07/21/2019   Pain in left knee 03/25/2019   Osteopenia 01/16/2019   Anxiety 12/01/2018   Hypertension 07/23/2018   Family history of diabetes mellitus (DM) 07/20/2018   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   Personal history of other malignant neoplasm of skin 03/05/2017   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   Osteoarthritis 03/21/2016   Vitamin D deficiency 02/06/2010   VERTIGO 07/25/2009   DIVERTICULOSIS, COLON 10/21/2008   PALPITATIONS, OCCASIONAL 10/21/2008   History of colonic polyps 10/31/2006   Dyslipidemia 07/01/2006    PCP: Binnie Rail,  MD  REFERRING PROVIDER: Binnie Rail, MD  REFERRING DIAG:  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 Her R thumb had a flare up the other day but it is gone now. She has been very compliant with HEP.  She feels she is done with therapy and able to independently manage her pain and exercise.    She is trying to stay active (walks 30-40 minutes a day).     PERTINENT HISTORY: PMH: ANA positive, Anxiety, Diverticulosis of colon, Dyslipidemia, Fatigue, History of colon polyps, Hypertension, Osteoarthritis, Osteopenia, Palpitations, Pseudogout of right wrist, Rheumatoid factor positive, Skin cancer of nose, Vertigo, and Vitamin D deficiency. She has a past medical history of ANA positive, Anxiety, Diverticulosis of colon, Dyslipidemia, Fatigue, History of colon polyps, Hypertension, Osteoarthritis, Osteopenia, Palpitations, Pseudogout of  right wrist, Rheumatoid factor positive, Skin cancer of nose, Vertigo, and Vitamin D deficiency.  of ANA positive, Anxiety, Diverticulosis of colon, Dyslipidemia, Fatigue, History of colon polyps, Hypertension, Osteoarthritis, Osteopenia, Palpitations, Pseudogout of right wrist, Rheumatoid factor positive, Skin cancer of nose, Vertigo, and Vitamin D deficiency.   PAIN:  Are you having pain? No NPRS scale: 0/10 Pain location: R hand Pain orientation: Right  PAIN TYPE: aching Pain description: constant   Aggravating factors: overuse, stress,  Relieving factors: medicine, rest FALLS:  Has patient fallen in last 6 months? No, Number of falls: 0  LIVING ENVIRONMENT: Lives with: lives alone Lives in: House/apartment Stairs: Yes; 3 to enter, has 2 story but does not use upstairs - infrequently Has following equipment at home: None She lives alone with [employer] (lab)  OCCUPATION: retired Retail banker  PLOF: Independent  PATIENT GOALS :  pt would like to improve her strength and flexibility.    OBJECTIVE:   BP seated; L arm, 136/91  LE AROM/PROM:   A/PROM Right  Left   Hip flexion 115 115  Hip extension 10 10  Hip abduction      Hip adduction      Hip internal rotation WFL in seated WFL in seated   Hip external rotation Able to cross and uncross with self OP;  Able to cross and uncross with self OP;  Knee flexion [facility]  Knee extension [facility] WFL   (Blank rows = not tested)   LE MMT:   MMT Right 02/15/2021 Left 02/15/2021  Hip flexion 4+/5 4+/5  Hip extension 4+/5 4+/5  Hip abduction 4+/5 4+/5  Hip adduction 4+/5 4+/5  Knee flexion 5/5 5/5  Knee extension 5/5 5/5   (Blank rows = not tested)   Grip strength HHD in lbs    L 30lbs; 30; 40 R 5lbs ; 15,10    L 40 lbs, 40, 40 R 35lbs, 25, 30  FUNCTIONAL TESTS:  5 times sit to stand: 13.5s  TODAY'S TREATMENT:  Full HEP review, discussion of progression in the gym, transition to land   PATIENT EDUCATION:  Education details: self pain management techniques, anatomy, exercise progression, DOMS expectations, muscle firing,  envelope of function, HEP, POC  Person educated: Patient Education method: Explanation, Demonstration, Tactile cues, Verbal cues, and Handouts Education comprehension: verbalized understanding, returned demonstration, verbal cues required, and tactile cues required   HOME EXERCISE PROGRAM: Access Code: [email or web address] URL: [email or web address] Date: 02/15/2021 Prepared by:  Daleen Bo  ASSESSMENT:  CLINICAL IMPRESSION: Pt has met nearly all PT goals at this time. Pt is very independent with exercise and has not had any pain with HEP or aquatic exercise. Pt feels she is able to independently progress exercise and continue with fitness program. Pt's objective measures support her assessment and skilled therapy also agrees that she has good knowledge and understanding of self management. D/C this episode of care.   Objective impairments include Abnormal gait, decreased activity tolerance, decreased balance, decreased endurance, decreased mobility, difficulty walking, decreased ROM, decreased strength, hypomobility, increased muscle spasms, impaired flexibility, improper body mechanics, postural dysfunction, and pain. These impairments are limiting patient from cleaning, community activity, driving, meal prep, laundry, yard work, shopping, and exercise and recreation . Personal factors including Age, Fitness, Time since onset of injury/illness/exacerbation, and 3+ comorbidities:    are also affecting patient's functional outcome. Patient will benefit from skilled PT to address above impairments and improve overall function.  REHAB POTENTIAL: Fair    CLINICAL DECISION MAKING: Evolving/moderate complexity  EVALUATION COMPLEXITY: Moderate   GOALS:   SHORT TERM GOALS:  STG Name Target Date Goal status  1 Pt will become independent with HEP in order to demonstrate synthesis of PT education.  Baseline:  05/07/2021 MET  2 Pt will be able to demonstrate ability to improve bilat hip and knee extensor strength to 5/5/ in order to demonstrate functional improvement in LE function for self-care and house hold duties.  Baseline:  05/07/2021 MET  3 Pt will be able to demonstrate ability to improve R hand grip strength by at least 10% in order to demonstrate functional improvement in UE function for self-care and house hold duties.  Baseline: 05/07/2021 MET   LONG TERM GOALS:    LTG Name Target Date Goal status  1 Pt  will become independent with final HEP in order to demonstrate synthesis of PT education.  Baseline: 06/18/2021 MET  2 Pt will be able to perform 5XSTS in under 12s  in order to demonstrate functional improvement above the cut off score for adults.  Baseline: 06/18/2021 Partially met  3 Pt will be able to demonstrate reciprocal stair standing without pain in order to demonstrate functional improvement in LE function for self-care and house hold duties.  Baseline: 06/18/2021 MET   PLAN: PT FREQUENCY: 1x/week  PT DURATION: 12 weeks (likely d/c by 8 weeks)  PLANNED INTERVENTIONS: Therapeutic exercises, Therapeutic activity, Neuro Muscular re-education, Balance training, Gait training, Patient/Family education, Joint mobilization, Stair training, Orthotic/Fit training, DME instructions, Aquatic Therapy, Dry Needling, Electrical stimulation, Wheelchair mobility training, Cryotherapy, Moist heat, Compression bandaging, Taping, Vasopneumatic device, Traction, Ultrasound, Ionotophoresis 10m/ml Dexamethasone, and Manual therapy     ADaleen Bo PT 03/26/2021, 4:27 PM

## 2021-03-29 ENCOUNTER — Encounter (HOSPITAL_BASED_OUTPATIENT_CLINIC_OR_DEPARTMENT_OTHER): Payer: Medicare PPO | Admitting: Physical Therapy

## 2021-03-29 DIAGNOSIS — L821 Other seborrheic keratosis: Secondary | ICD-10-CM | POA: Diagnosis not present

## 2021-03-29 DIAGNOSIS — L570 Actinic keratosis: Secondary | ICD-10-CM | POA: Diagnosis not present

## 2021-03-29 DIAGNOSIS — L814 Other melanin hyperpigmentation: Secondary | ICD-10-CM | POA: Diagnosis not present

## 2021-03-29 DIAGNOSIS — D225 Melanocytic nevi of trunk: Secondary | ICD-10-CM | POA: Diagnosis not present

## 2021-03-29 DIAGNOSIS — B351 Tinea unguium: Secondary | ICD-10-CM | POA: Diagnosis not present

## 2021-03-29 DIAGNOSIS — Z85828 Personal history of other malignant neoplasm of skin: Secondary | ICD-10-CM | POA: Diagnosis not present

## 2021-03-29 DIAGNOSIS — Z08 Encounter for follow-up examination after completed treatment for malignant neoplasm: Secondary | ICD-10-CM | POA: Diagnosis not present

## 2021-03-29 DIAGNOSIS — D2271 Melanocytic nevi of right lower limb, including hip: Secondary | ICD-10-CM | POA: Diagnosis not present

## 2021-03-29 DIAGNOSIS — L720 Epidermal cyst: Secondary | ICD-10-CM | POA: Diagnosis not present

## 2021-04-02 ENCOUNTER — Encounter (HOSPITAL_BASED_OUTPATIENT_CLINIC_OR_DEPARTMENT_OTHER): Payer: Medicare PPO | Admitting: Physical Therapy

## 2021-04-11 ENCOUNTER — Encounter: Payer: Self-pay | Admitting: Internal Medicine

## 2021-04-12 ENCOUNTER — Other Ambulatory Visit: Payer: Self-pay

## 2021-04-12 ENCOUNTER — Encounter (HOSPITAL_BASED_OUTPATIENT_CLINIC_OR_DEPARTMENT_OTHER): Payer: Medicare PPO | Admitting: Physical Therapy

## 2021-04-12 MED ORDER — ROSUVASTATIN CALCIUM 5 MG PO TABS: ORAL_TABLET | ORAL | 1 refills | Status: DC

## 2021-04-16 ENCOUNTER — Encounter (HOSPITAL_BASED_OUTPATIENT_CLINIC_OR_DEPARTMENT_OTHER): Payer: Medicare PPO | Admitting: Physical Therapy

## 2021-05-10 DIAGNOSIS — L570 Actinic keratosis: Secondary | ICD-10-CM | POA: Diagnosis not present

## 2021-06-06 ENCOUNTER — Other Ambulatory Visit: Payer: Self-pay | Admitting: Internal Medicine

## 2021-07-04 ENCOUNTER — Encounter: Payer: Self-pay | Admitting: Internal Medicine

## 2021-07-04 DIAGNOSIS — E038 Other specified hypothyroidism: Secondary | ICD-10-CM | POA: Insufficient documentation

## 2021-07-04 NOTE — Patient Instructions (Addendum)
Your ears were cleaned out.    Blood work was ordered.     Medications changes include :   goal is 1200 mg of calcium a day, vitamin d 1000-2000 units, vitamin K 50-100 mcg per day    Return in about 1 year (around 07/06/2022) for Physical Exam.   Health Maintenance, Female Adopting a healthy lifestyle and getting preventive care are important in promoting health and wellness. Ask your health care provider about: The right schedule for you to have regular tests and exams. Things you can do on your own to prevent diseases and keep yourself healthy. What should I know about diet, weight, and exercise? Eat a healthy diet  Eat a diet that includes plenty of vegetables, fruits, low-fat dairy products, and lean protein. Do not eat a lot of foods that are high in solid fats, added sugars, or sodium. Maintain a healthy weight Body mass index (BMI) is used to identify weight problems. It estimates body fat based on height and weight. Your health care provider can help determine your BMI and help you achieve or maintain a healthy weight. Get regular exercise Get regular exercise. This is one of the most important things you can do for your health. Most adults should: Exercise for at least 150 minutes each week. The exercise should increase your heart rate and make you sweat (moderate-intensity exercise). Do strengthening exercises at least twice a week. This is in addition to the moderate-intensity exercise. Spend less time sitting. Even light physical activity can be beneficial. Watch cholesterol and blood lipids Have your blood tested for lipids and cholesterol at 79 years of age, then have this test every 5 years. Have your cholesterol levels checked more often if: Your lipid or cholesterol levels are high. You are older than 79 years of age. You are at high risk for heart disease. What should I know about cancer screening? Depending on your health history and family history, you may  need to have cancer screening at various ages. This may include screening for: Breast cancer. Cervical cancer. Colorectal cancer. Skin cancer. Lung cancer. What should I know about heart disease, diabetes, and high blood pressure? Blood pressure and heart disease High blood pressure causes heart disease and increases the risk of stroke. This is more likely to develop in people who have high blood pressure readings or are overweight. Have your blood pressure checked: Every 3-5 years if you are 54-73 years of age. Every year if you are 32 years old or older. Diabetes Have regular diabetes screenings. This checks your fasting blood sugar level. Have the screening done: Once every three years after age 20 if you are at a normal weight and have a low risk for diabetes. More often and at a younger age if you are overweight or have a high risk for diabetes. What should I know about preventing infection? Hepatitis B If you have a higher risk for hepatitis B, you should be screened for this virus. Talk with your health care provider to find out if you are at risk for hepatitis B infection. Hepatitis C Testing is recommended for: Everyone born from [birth year] through 1963. Anyone with known risk factors for hepatitis C. Sexually transmitted infections (STIs) Get screened for STIs, including gonorrhea and chlamydia, if: You are sexually active and are younger than 78 years of age. You are older than 79 years of age and your health care provider tells you that you are at risk for this type of infection. Your  sexual activity has changed since you were last screened, and you are at increased risk for chlamydia or gonorrhea. Ask your health care provider if you are at risk. Ask your health care provider about whether you are at high risk for HIV. Your health care provider may recommend a prescription medicine to help prevent HIV infection. If you choose to take medicine to prevent HIV, you should first get  tested for HIV. You should then be tested every 3 months for as long as you are taking the medicine. Pregnancy If you are about to stop having your period (premenopausal) and you may become pregnant, seek counseling before you get pregnant. Take 400 to 800 micrograms (mcg) of folic acid every day if you become pregnant. Ask for birth control (contraception) if you want to prevent pregnancy. Osteoporosis and menopause Osteoporosis is a disease in which the bones lose minerals and strength with aging. This can result in bone fractures. If you are 61 years old or older, or if you are at risk for osteoporosis and fractures, ask your health care provider if you should: Be screened for bone loss. Take a calcium or vitamin D supplement to lower your risk of fractures. Be given hormone replacement therapy (HRT) to treat symptoms of menopause. Follow these instructions at home: Alcohol use Do not drink alcohol if: Your health care provider tells you not to drink. You are pregnant, may be pregnant, or are planning to become pregnant. If you drink alcohol: Limit how much you have to: 0-1 drink a day. Know how much alcohol is in your drink. In the [place] one drink equals one 12 oz bottle of beer (355 mL), one 5 oz glass of wine (148 mL), or one 1 oz glass of hard liquor (44 mL). Lifestyle Do not use any products that contain nicotine or tobacco. These products include cigarettes, chewing tobacco, and vaping devices, such as e-cigarettes. If you need help quitting, ask your health care provider. Do not use street drugs. Do not share needles. Ask your health care provider for help if you need support or information about quitting drugs. General instructions Schedule regular health, dental, and eye exams. Stay current with your vaccines. Tell your health care provider if: You often feel depressed. You have ever been abused or do not feel safe at home. Summary Adopting a healthy lifestyle and getting  preventive care are important in promoting health and wellness. Follow your health care provider's instructions about healthy diet, exercising, and getting tested or screened for diseases. Follow your health care provider's instructions on monitoring your cholesterol and blood pressure. This information is not intended to replace advice given to you by your health care provider. Make sure you discuss any questions you have with your health care provider. Document Revised: 05/29/2020 Document Reviewed: 05/29/2020 Elsevier Patient Education  [place].

## 2021-07-04 NOTE — Progress Notes (Signed)
Subjective:    Patient ID: [name], female    DOB: 07/28/1940, 81 y.o.   MRN: [id number]      HPI [name] is here for a Physical exam.   ? Dx with RA - she is not sure.  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  She is not sure if she has RA.  She is sore when she first wakes up.  Her joint pain is stable.  She is doing water aerobics.   Needs vitamin K  Medications and allergies reviewed with patient and updated if appropriate.  Current Outpatient Medications on File Prior to Visit  Medication Sig Dispense Refill   calcium-vitamin D (OSCAL WITH D) 500-200 MG-UNIT tablet Calcium 500 + D (D3)  1 po qd otc     escitalopram (LEXAPRO) 5 MG tablet TAKE ONE TABLET BY MOUTH ONCE DAILY 30 tablet 0   hydroxychloroquine (PLAQUENIL) 200 MG tablet Take 200 mg by mouth 2 (two) times daily.     rosuvastatin (CRESTOR) 5 MG tablet TAKE ONE TABLET BY MOUTH THREE TIMES A WEEK 39 tablet 1   No current facility-administered medications on file prior to visit.    Review of Systems  Constitutional:  Negative for fever.  Eyes:  Negative for visual disturbance.  Respiratory:  Negative for cough, shortness of breath and wheezing.   Cardiovascular:  Negative for chest pain, palpitations and leg swelling.  Gastrointestinal:  Negative for abdominal pain, blood in stool, constipation, diarrhea and nausea.       No gerd  Genitourinary:  Negative for dysuria.  Musculoskeletal:  Positive for arthralgias. Negative for back pain.  Skin:  Negative for rash.  Neurological:  Positive for light-headedness (occ, mild in morning). Negative for headaches.  Psychiatric/Behavioral:  Negative for dysphoric mood. The patient is nervous/anxious.        Objective:   Vitals:   07/05/21 1026  BP: 130/68  Pulse: 69  Temp: 97.7 F (36.5 C)  SpO2: 96%   Filed Weights   07/05/21 1026  Weight: 167 lb (75.8 kg)   Body mass index is 26.16 kg/m.  BP Readings from Last 3  Encounters:  07/05/21 130/68  06/09/20 118/74  03/23/20 122/78    Wt Readings from Last 3 Encounters:  07/05/21 167 lb (75.8 kg)  06/09/20 176 lb 6.4 oz (80 kg)  03/23/20 171 lb (77.6 kg)       Physical Exam Constitutional: She appears well-developed and well-nourished. No distress.  HENT:  Head: Normocephalic and atraumatic.  Right Ear: External ear normal. Normal ear canal with excessive wax. TM not visualized Left Ear: External ear normal.  Normal ear canal with excessive wax. TM not visualized Mouth/Throat: Oropharynx is clear and moist.  Eyes: Conjunctivae normal.  Neck: Neck supple. No tracheal deviation present. No thyromegaly present.  No carotid bruit  Cardiovascular: Normal rate, regular rhythm and normal heart sounds.   No murmur heard.  No edema. Pulmonary/Chest: Effort normal and breath sounds normal. No respiratory distress. She has no wheezes. She has no rales.  Breast: deferred   Abdominal: Soft. She exhibits no distension. There is no tenderness.  Lymphadenopathy: She has no cervical adenopathy.  Skin: Skin is warm and dry. She is not diaphoretic.  Psychiatric: She has a normal mood and affect. Her behavior is normal.     PRE-PROCEDURE EXAM: Bilateral TM's cannot be visualized due to total occlusion/impaction of the ear canal. PROCEDURE INDICATION: remove wax to visualize ear drum &  relieve discomfort CONSENT:  Verbal   PROCEDURE NOTE:   RIGHT EAR:  The CMA irrigated the ear canal with warm water to remove the wax.  LEFT EAR:  The CMA irrigated the ear canal with warm water to remove the wax.  POST- PROCEDURE EXAM: B/l TMs successfully visualized and found to have no erythema.  Right ear canal with scant remaining cerumen.  Left ear canal with no remaining cerumen.   Patient tolerated the procedure well and had relief of their symptoms after the procedure.   Lab Results  Component Value Date   WBC 3.6 (L) 06/09/2020   HGB 12.7 06/09/2020   HCT  37.8 06/09/2020   PLT 266.0 06/09/2020   GLUCOSE 81 06/09/2020   CHOL 236 (H) 06/09/2020   TRIG 45.0 06/09/2020   HDL 95.40 06/09/2020   LDLDIRECT 149.6 03/19/2012   LDLCALC 132 (H) 06/09/2020   ALT 19 06/09/2020   AST 17 06/09/2020   NA 142 06/09/2020   K 4.4 06/09/2020   CL 106 06/09/2020   CREATININE 0.59 06/09/2020   BUN 18 06/09/2020   CO2 30 06/09/2020   TSH 4.72 (H) 06/09/2020   HGBA1C 5.3 06/07/2019         Assessment & Plan:   Physical exam: Screening blood work  ordered Exercise regular - water aerobics, walking dog twice a day Weight good for age Substance abuse  none   Reviewed recommended immunizations.   Health Maintenance  Topic Date Due   COVID-19 Vaccine (5 - Pfizer series) 02/24/2021   TETANUS/TDAP  07/06/2022 (Originally 10/17/2019)   INFLUENZA VACCINE  08/21/2021   DEXA SCAN  02/07/2023   COLONOSCOPY (Pts 45-88yr Insurance coverage will need to be confirmed)  09/15/2023   Pneumonia Vaccine 79 Years old  Completed   Hepatitis C Screening  Completed   Zoster Vaccines- Shingrix  Completed   HPV VACCINES  Aged Out          See Problem List for Assessment and Plan of chronic medical problems.

## 2021-07-05 ENCOUNTER — Other Ambulatory Visit: Payer: Self-pay | Admitting: Internal Medicine

## 2021-07-05 ENCOUNTER — Ambulatory Visit (INDEPENDENT_AMBULATORY_CARE_PROVIDER_SITE_OTHER): Payer: Medicare PPO | Admitting: Internal Medicine

## 2021-07-05 VITALS — BP 130/68 | HR 69 | Temp 97.7°F | Ht 67.0 in | Wt 167.0 lb

## 2021-07-05 DIAGNOSIS — F419 Anxiety disorder, unspecified: Secondary | ICD-10-CM | POA: Diagnosis not present

## 2021-07-05 DIAGNOSIS — E785 Hyperlipidemia, unspecified: Secondary | ICD-10-CM | POA: Diagnosis not present

## 2021-07-05 DIAGNOSIS — I10 Essential (primary) hypertension: Secondary | ICD-10-CM | POA: Diagnosis not present

## 2021-07-05 DIAGNOSIS — M85851 Other specified disorders of bone density and structure, right thigh: Secondary | ICD-10-CM

## 2021-07-05 DIAGNOSIS — E559 Vitamin D deficiency, unspecified: Secondary | ICD-10-CM | POA: Diagnosis not present

## 2021-07-05 DIAGNOSIS — Z0000 Encounter for general adult medical examination without abnormal findings: Secondary | ICD-10-CM

## 2021-07-05 DIAGNOSIS — E038 Other specified hypothyroidism: Secondary | ICD-10-CM | POA: Diagnosis not present

## 2021-07-05 DIAGNOSIS — M1990 Unspecified osteoarthritis, unspecified site: Secondary | ICD-10-CM | POA: Diagnosis not present

## 2021-07-05 DIAGNOSIS — H6123 Impacted cerumen, bilateral: Secondary | ICD-10-CM | POA: Insufficient documentation

## 2021-07-05 DIAGNOSIS — Z833 Family history of diabetes mellitus: Secondary | ICD-10-CM | POA: Diagnosis not present

## 2021-07-05 NOTE — Assessment & Plan Note (Addendum)
Chronic °Not taking vitamin D daily °Check vitamin D level ° °

## 2021-07-05 NOTE — Assessment & Plan Note (Addendum)
Chronic BP well controlled Stopped lisinopril - causing too much urination BP at home has been controlled Continue amlodipine 5 mg daily cmp

## 2021-07-05 NOTE — Assessment & Plan Note (Signed)
Chronic ? RA Following with rheumatology Taking Plaquenil

## 2021-07-05 NOTE — Assessment & Plan Note (Signed)
Chronic Regular exercise and healthy diet encouraged Check lipid panel  Continue Crestor 5 mg 3 times a week 

## 2021-07-05 NOTE — Assessment & Plan Note (Signed)
Chronic Check a1c Low sugar / carb diet Stressed regular exercise  

## 2021-07-05 NOTE — Assessment & Plan Note (Signed)
Last tsh was elevated Asymptomatic, clinically euthyroid Check TSH

## 2021-07-05 NOTE — Assessment & Plan Note (Addendum)
Chronic DEXA up-to-date FRAX is low Continue regular exercise Not taking calcium and vitamin D - stressed taking a vitamin d daily Advised making sure she is taking vitamin K 50-100 mcg daily

## 2021-07-05 NOTE — Assessment & Plan Note (Signed)
Chronic Controlled, Stable Continue Lexapro 5 mg daily 

## 2021-07-05 NOTE — Assessment & Plan Note (Signed)
Acute Bilateral ear canals with obstructed wax which is causing some discomfort, clogged sensation and decreased hearing CMA successfully lavaged both ear canals She tolerated procedure well and had improved hearing and less discomfort after the procedure

## 2021-07-11 ENCOUNTER — Other Ambulatory Visit: Payer: Self-pay | Admitting: Internal Medicine

## 2021-07-16 ENCOUNTER — Other Ambulatory Visit (INDEPENDENT_AMBULATORY_CARE_PROVIDER_SITE_OTHER): Payer: Medicare PPO

## 2021-07-16 ENCOUNTER — Encounter: Payer: Self-pay | Admitting: Internal Medicine

## 2021-07-16 DIAGNOSIS — Z833 Family history of diabetes mellitus: Secondary | ICD-10-CM | POA: Diagnosis not present

## 2021-07-16 DIAGNOSIS — F419 Anxiety disorder, unspecified: Secondary | ICD-10-CM

## 2021-07-16 DIAGNOSIS — E785 Hyperlipidemia, unspecified: Secondary | ICD-10-CM | POA: Diagnosis not present

## 2021-07-16 DIAGNOSIS — E038 Other specified hypothyroidism: Secondary | ICD-10-CM | POA: Diagnosis not present

## 2021-07-16 DIAGNOSIS — I10 Essential (primary) hypertension: Secondary | ICD-10-CM | POA: Diagnosis not present

## 2021-07-16 DIAGNOSIS — E559 Vitamin D deficiency, unspecified: Secondary | ICD-10-CM

## 2021-07-16 LAB — CBC WITH DIFFERENTIAL/PLATELET
Basophils Absolute: 0.1 10*3/uL (ref 0.0–0.1)
Basophils Relative: 2 % (ref 0.0–3.0)
Eosinophils Absolute: 0.2 10*3/uL (ref 0.0–0.7)
Eosinophils Relative: 5.3 % — ABNORMAL HIGH (ref 0.0–5.0)
HCT: 37.8 % (ref 36.0–46.0)
Hemoglobin: 12.4 g/dL (ref 12.0–15.0)
Lymphocytes Relative: 32.3 % (ref 12.0–46.0)
Lymphs Abs: 1.2 10*3/uL (ref 0.7–4.0)
MCHC: 32.9 g/dL (ref 30.0–36.0)
MCV: 94.8 fl (ref 78.0–100.0)
Monocytes Absolute: 0.4 10*3/uL (ref 0.1–1.0)
Monocytes Relative: 12.3 % — ABNORMAL HIGH (ref 3.0–12.0)
Neutro Abs: 1.8 10*3/uL (ref 1.4–7.7)
Neutrophils Relative %: 48.1 % (ref 43.0–77.0)
Platelets: 260 10*3/uL (ref 150.0–400.0)
RBC: 3.98 Mil/uL (ref 3.87–5.11)
RDW: 14.8 % (ref 11.5–15.5)
WBC: 3.7 10*3/uL — ABNORMAL LOW (ref 4.0–10.5)

## 2021-07-16 LAB — COMPREHENSIVE METABOLIC PANEL
ALT: 17 U/L (ref 0–35)
AST: 20 U/L (ref 0–37)
Albumin: 3.8 g/dL (ref 3.5–5.2)
Alkaline Phosphatase: 57 U/L (ref 39–117)
BUN: 17 mg/dL (ref 6–23)
CO2: 31 mEq/L (ref 19–32)
Calcium: 8.9 mg/dL (ref 8.4–10.5)
Chloride: 104 mEq/L (ref 96–112)
Creatinine, Ser: 0.76 mg/dL (ref 0.40–1.20)
GFR: 74.57 mL/min (ref >60.00)
Glucose, Bld: 84 mg/dL (ref 70–99)
Potassium: 4.1 mEq/L (ref 3.5–5.1)
Sodium: 140 mEq/L (ref 135–145)
Total Bilirubin: 0.4 mg/dL (ref 0.2–1.2)
Total Protein: 6.8 g/dL (ref 6.0–8.3)

## 2021-07-16 LAB — LIPID PANEL
Cholesterol: 219 mg/dL — ABNORMAL HIGH (ref 0–200)
HDL: 90.5 mg/dL (ref >39.00)
LDL Cholesterol: 119 mg/dL — ABNORMAL HIGH (ref 0–99)
NonHDL: 128.12
Total CHOL/HDL Ratio: 2
Triglycerides: 46 mg/dL (ref 0.0–149.0)
VLDL: 9.2 mg/dL (ref 0.0–40.0)

## 2021-07-16 LAB — VITAMIN D 25 HYDROXY (VIT D DEFICIENCY, FRACTURES): VITD: 33.44 ng/mL (ref 30.00–100.00)

## 2021-07-16 LAB — HEMOGLOBIN A1C: Hgb A1c MFr Bld: 5.6 % (ref 4.6–6.5)

## 2021-07-16 LAB — TSH: TSH: 7.86 u[IU]/mL — ABNORMAL HIGH (ref 0.35–5.50)

## 2021-07-23 DIAGNOSIS — E785 Hyperlipidemia, unspecified: Secondary | ICD-10-CM | POA: Diagnosis not present

## 2021-07-23 DIAGNOSIS — M159 Polyosteoarthritis, unspecified: Secondary | ICD-10-CM | POA: Diagnosis not present

## 2021-07-23 DIAGNOSIS — R768 Other specified abnormal immunological findings in serum: Secondary | ICD-10-CM | POA: Diagnosis not present

## 2021-07-23 DIAGNOSIS — M154 Erosive (osteo)arthritis: Secondary | ICD-10-CM | POA: Diagnosis not present

## 2021-07-23 DIAGNOSIS — M25541 Pain in joints of right hand: Secondary | ICD-10-CM | POA: Diagnosis not present

## 2021-07-23 DIAGNOSIS — M25562 Pain in left knee: Secondary | ICD-10-CM | POA: Diagnosis not present

## 2021-07-23 DIAGNOSIS — R5382 Chronic fatigue, unspecified: Secondary | ICD-10-CM | POA: Diagnosis not present

## 2021-07-23 DIAGNOSIS — M11231 Other chondrocalcinosis, right wrist: Secondary | ICD-10-CM | POA: Diagnosis not present

## 2021-07-23 DIAGNOSIS — E559 Vitamin D deficiency, unspecified: Secondary | ICD-10-CM | POA: Diagnosis not present

## 2021-07-30 DIAGNOSIS — Z1231 Encounter for screening mammogram for malignant neoplasm of breast: Secondary | ICD-10-CM | POA: Diagnosis not present

## 2021-07-30 LAB — HM MAMMOGRAPHY

## 2021-07-31 ENCOUNTER — Encounter: Payer: Self-pay | Admitting: Internal Medicine

## 2021-07-31 NOTE — Progress Notes (Signed)
Outside notes received. Information abstracted. Notes sent to scan.  

## 2021-08-01 DIAGNOSIS — Z79899 Other long term (current) drug therapy: Secondary | ICD-10-CM | POA: Diagnosis not present

## 2021-08-01 DIAGNOSIS — H2513 Age-related nuclear cataract, bilateral: Secondary | ICD-10-CM | POA: Diagnosis not present

## 2021-08-01 DIAGNOSIS — H35371 Puckering of macula, right eye: Secondary | ICD-10-CM | POA: Diagnosis not present

## 2021-08-01 DIAGNOSIS — H04123 Dry eye syndrome of bilateral lacrimal glands: Secondary | ICD-10-CM | POA: Diagnosis not present

## 2021-08-04 ENCOUNTER — Other Ambulatory Visit: Payer: Self-pay | Admitting: Internal Medicine

## 2021-08-16 ENCOUNTER — Telehealth: Payer: Self-pay | Admitting: Internal Medicine

## 2021-08-16 NOTE — Telephone Encounter (Signed)
Left message for patient to call back to schedule Medicare Annual Wellness Visit   Last AWV  11/11/19  Please schedule at anytime with [facility] if patient calls the office back.      Any questions, please call me at [phone]

## 2021-09-12 ENCOUNTER — Encounter: Payer: Self-pay | Admitting: Internal Medicine

## 2021-09-13 ENCOUNTER — Ambulatory Visit: Payer: Medicare PPO | Admitting: Family Medicine

## 2021-09-13 ENCOUNTER — Encounter: Payer: Self-pay | Admitting: Family Medicine

## 2021-09-13 VITALS — BP 120/64 | HR 70 | Temp 96.9°F | Ht 67.0 in | Wt 168.0 lb

## 2021-09-13 DIAGNOSIS — M545 Low back pain, unspecified: Secondary | ICD-10-CM

## 2021-09-13 DIAGNOSIS — M5459 Other low back pain: Secondary | ICD-10-CM

## 2021-09-13 DIAGNOSIS — B349 Viral infection, unspecified: Secondary | ICD-10-CM | POA: Diagnosis not present

## 2021-09-13 DIAGNOSIS — R0602 Shortness of breath: Secondary | ICD-10-CM | POA: Diagnosis not present

## 2021-09-13 DIAGNOSIS — R051 Acute cough: Secondary | ICD-10-CM | POA: Diagnosis not present

## 2021-09-13 DIAGNOSIS — Z789 Other specified health status: Secondary | ICD-10-CM

## 2021-09-13 DIAGNOSIS — R140 Abdominal distension (gaseous): Secondary | ICD-10-CM | POA: Diagnosis not present

## 2021-09-13 DIAGNOSIS — R110 Nausea: Secondary | ICD-10-CM | POA: Diagnosis not present

## 2021-09-13 DIAGNOSIS — R52 Pain, unspecified: Secondary | ICD-10-CM

## 2021-09-13 DIAGNOSIS — R0609 Other forms of dyspnea: Secondary | ICD-10-CM | POA: Insufficient documentation

## 2021-09-13 DIAGNOSIS — R5383 Other fatigue: Secondary | ICD-10-CM | POA: Diagnosis not present

## 2021-09-13 LAB — POCT URINALYSIS DIPSTICK
Bilirubin, UA: NEGATIVE
Blood, UA: NEGATIVE
Glucose, UA: NEGATIVE
Ketones, UA: NEGATIVE
Leukocytes, UA: NEGATIVE
Nitrite, UA: NEGATIVE
Protein, UA: NEGATIVE
Spec Grav, UA: 1.02 (ref 1.010–1.025)
Urobilinogen, UA: 0.2 E.U./dL
pH, UA: 6 (ref 5.0–8.0)

## 2021-09-13 LAB — CBC WITH DIFFERENTIAL/PLATELET
Basophils Absolute: 0.1 10*3/uL (ref 0.0–0.1)
Basophils Relative: 1.3 % (ref 0.0–3.0)
Eosinophils Absolute: 0.3 10*3/uL (ref 0.0–0.7)
Eosinophils Relative: 5.7 % — ABNORMAL HIGH (ref 0.0–5.0)
HCT: 37.9 % (ref 36.0–46.0)
Hemoglobin: 12.5 g/dL (ref 12.0–15.0)
Lymphocytes Relative: 26.2 % (ref 12.0–46.0)
Lymphs Abs: 1.4 10*3/uL (ref 0.7–4.0)
MCHC: 33.1 g/dL (ref 30.0–36.0)
MCV: 94.3 fl (ref 78.0–100.0)
Monocytes Absolute: 0.4 10*3/uL (ref 0.1–1.0)
Monocytes Relative: 7.1 % (ref 3.0–12.0)
Neutro Abs: 3.1 10*3/uL (ref 1.4–7.7)
Neutrophils Relative %: 59.7 % (ref 43.0–77.0)
Platelets: 263 10*3/uL (ref 150.0–400.0)
RBC: 4.02 Mil/uL (ref 3.87–5.11)
RDW: 14.8 % (ref 11.5–15.5)
WBC: 5.2 10*3/uL (ref 4.0–10.5)

## 2021-09-13 LAB — COMPREHENSIVE METABOLIC PANEL
ALT: 18 U/L (ref 0–35)
AST: 19 U/L (ref 0–37)
Albumin: 4 g/dL (ref 3.5–5.2)
Alkaline Phosphatase: 60 U/L (ref 39–117)
BUN: 20 mg/dL (ref 6–23)
CO2: 33 mEq/L — ABNORMAL HIGH (ref 19–32)
Calcium: 9.2 mg/dL (ref 8.4–10.5)
Chloride: 100 mEq/L (ref 96–112)
Creatinine, Ser: 0.72 mg/dL (ref 0.40–1.20)
GFR: 79.48 mL/min (ref >60.00)
Glucose, Bld: 125 mg/dL — ABNORMAL HIGH (ref 70–99)
Potassium: 4.6 mEq/L (ref 3.5–5.1)
Sodium: 139 mEq/L (ref 135–145)
Total Bilirubin: 0.4 mg/dL (ref 0.2–1.2)
Total Protein: 7.5 g/dL (ref 6.0–8.3)

## 2021-09-13 LAB — D-DIMER, QUANTITATIVE: D-Dimer, Quant: 0.28 mcg/mL FEU (ref <0.50)

## 2021-09-13 LAB — POC COVID19 BINAXNOW: SARS Coronavirus 2 Ag: NEGATIVE

## 2021-09-13 LAB — TROPONIN I (HIGH SENSITIVITY): High Sens Troponin I: 5 ng/L (ref 2–17)

## 2021-09-13 MED ORDER — ONDANSETRON 4 MG PO TBDP: 4.0000 mg | ORAL_TABLET | Freq: Three times a day (TID) | ORAL | 0 refills | Status: DC | PRN

## 2021-09-13 NOTE — Assessment & Plan Note (Signed)
Exam benign. Should resolve.

## 2021-09-13 NOTE — Assessment & Plan Note (Signed)
UA negative. Most likely MSK etiology with recent heavy lifting of a rug.

## 2021-09-13 NOTE — Assessment & Plan Note (Signed)
Negative Covid test. Viral syndrome. Take NSAIDs or Tylenol. Follow up as needed.

## 2021-09-13 NOTE — Assessment & Plan Note (Signed)
Increases risk for DVT/PE as a result but unlikely based on examination. D-dimer ordered as precautionary

## 2021-09-13 NOTE — Assessment & Plan Note (Signed)
zofran prescribed. Suspect part of viral illness

## 2021-09-13 NOTE — Patient Instructions (Signed)
Your COVID test is negative.  Your urine is normal today.  It appears that you have a viral illness.  Please go to the lab on the first floor before you leave today for blood work.  We will be in touch with your results.  If you have any new or worsening symptoms, please let us know.

## 2021-09-13 NOTE — Assessment & Plan Note (Signed)
Negative Covid test. Improving. Suspect viral illness

## 2021-09-13 NOTE — Assessment & Plan Note (Signed)
Viral syndrome most likely. Check labs.

## 2021-09-13 NOTE — Assessment & Plan Note (Signed)
Exam benign. She is able to exercise without difficulty. Check labs including D-dimer and troponin. No cardiac symptoms.

## 2021-09-13 NOTE — Progress Notes (Signed)
Subjective:     Patient ID: [name], female    DOB: Jul 26, 1940, 81 y.o.   MRN: [id number]  Chief Complaint  Patient presents with   Headache    Sx have been going on for about a week, just got back from [place] 2 weeks ago   Back Pain    Lower back pain   Nausea    Feels nauseous but doesn't throw up    Shortness of Breath    HPI Patient is in today for a one week hx of chills, headache, fatigue, sensation of not being able to take a deep breath but able to exercise without DOE, low back pain on right, nausea without vomiting. These symptoms were preceded by URI and cough which has since resolved. She also reports feeling bloated and has not had a bowel movement in 2 days. No abdominal pain.  Denies leg pain or swelling. No hx of DVT or PE.   She was in [place] prior to onset of symptoms. Flew in business class and was able to stretch her legs out.   Denies fever, dizziness, chest pain, palpitations, urinary symptoms, LE edema.    She has RA and is on Plaquenil, has missed a dose recently.    There are no preventive care reminders to display for this patient.   Past Medical History:  Diagnosis Date   Cancer ([facility])    skin  cancer Nose   COLONIC POLYPS, HX OF 10/31/2006   DIVERTICULOSIS, COLON 10/21/2008   ELEVATED BP READING WITHOUT DX HYPERTENSION 07/12/2009   HYPERLIPIDEMIA 07/01/2006   Hypertension    PALPITATIONS, OCCASIONAL 10/21/2008   VERTIGO 07/25/2009    Past Surgical History:  Procedure Laterality Date   ABDOMINAL HYSTERECTOMY     COLONOSCOPY  02/24/2013   LAMINECTOMY     lumbar   ROTATOR CUFF REPAIR      Family History  Problem Relation Age of Onset   Diabetes Sister    Diabetes Brother    Melanoma Brother    Colon polyps Brother    Diabetes Mother    COPD Mother    Hypertension Father    Colon cancer Neg Hx    Pancreatic cancer Neg Hx    Stomach cancer Neg Hx    Esophageal cancer Neg Hx    Rectal cancer Neg Hx     Social History    Socioeconomic History   Marital status: Widowed    Spouse name: Not on file   Number of children: Not on file   Years of education: Not on file   Highest education level: Not on file  Occupational History   Not on file  Tobacco Use   Smoking status: Never   Smokeless tobacco: Never  Vaping Use   Vaping Use: Never used  Substance and Sexual Activity   Alcohol use: Yes    Comment: wine occ   Drug use: No   Sexual activity: Not Currently  Other Topics Concern   Not on file  Social History Narrative   Not on file   Social Determinants of Health   Financial Resource Strain: Low Risk  (11/11/2019)   Overall Financial Resource Strain (CARDIA)    Difficulty of Paying Living Expenses: Not hard at all  Food Insecurity: No Food Insecurity (11/11/2019)   Hunger Vital Sign    Worried About Running Out of Food in the Last Year: Never true    Ran Out of Food in the Last Year: Never true  Transportation Needs: No Transportation Needs (11/11/2019)   PRAPARE - Hydrologist (Medical): No    Lack of Transportation (Non-Medical): No  Physical Activity: Sufficiently Active (11/11/2019)   Exercise Vital Sign    Days of Exercise per Week: 7 days    Minutes of Exercise per Session: 60 min  Stress: No Stress Concern Present (11/11/2019)   [place]    Feeling of Stress : Not at all  Social Connections: Moderately Integrated (11/11/2019)   Social Connection and Isolation Panel [NHANES]    Frequency of Communication with Friends and Family: More than three times a week    Frequency of Social Gatherings with Friends and Family: More than three times a week    Attends Religious Services: More than 4 times per year    Active Member of [employer] or Organizations: Yes    Attends Archivist Meetings: More than 4 times per year    Marital Status: Widowed  Intimate Partner Violence: Not At Risk  (11/11/2019)   Humiliation, Afraid, Rape, and Kick questionnaire    Fear of Current or Ex-Partner: No    Emotionally Abused: No    Physically Abused: No    Sexually Abused: No    Outpatient Medications Prior to Visit  Medication Sig Dispense Refill   amLODipine (NORVASC) 5 MG tablet TAKE ONE TABLET BY MOUTH DAILY 90 tablet 1   calcium-vitamin D (OSCAL WITH D) 500-200 MG-UNIT tablet Calcium 500 + D (D3)  1 po qd otc     escitalopram (LEXAPRO) 5 MG tablet TAKE ONE TABLET BY MOUTH ONCE DAILY 30 tablet 0   hydroxychloroquine (PLAQUENIL) 200 MG tablet Take 200 mg by mouth 2 (two) times daily.     rosuvastatin (CRESTOR) 5 MG tablet TAKE ONE TABLET BY MOUTH THREE TIMES A WEEK 39 tablet 1   No facility-administered medications prior to visit.    No Known Allergies  ROS     Objective:    Physical Exam Constitutional:      General: She is not in acute distress.    Appearance: She is ill-appearing.  HENT:     Mouth/Throat:     Mouth: Mucous membranes are moist.  Eyes:     General: No visual field deficit.    Extraocular Movements: Extraocular movements intact.     Pupils: Pupils are equal, round, and reactive to light.  Cardiovascular:     Rate and Rhythm: Normal rate and regular rhythm.     Heart sounds: Normal heart sounds.  Pulmonary:     Effort: Pulmonary effort is normal.     Breath sounds: Normal breath sounds.  Abdominal:     General: Bowel sounds are normal. There is no distension.     Palpations: Abdomen is soft.     Tenderness: There is no abdominal tenderness. There is no right CVA tenderness, left CVA tenderness, guarding or rebound.  Musculoskeletal:        General: No swelling.     Cervical back: Normal range of motion and neck supple. No rigidity.     Thoracic back: Normal.     Lumbar back: Tenderness present. No bony tenderness. Normal range of motion.     Right lower leg: Normal.     Left lower leg: Normal. No tenderness.     Right ankle: Normal. No  swelling.     Left ankle: Normal.     Right foot: Normal. Normal capillary refill.  Normal pulse.     Left foot: Normal. Normal capillary refill. Normal pulse.     Comments: Right paraspinal muscle TTP  Lymphadenopathy:     Cervical: No cervical adenopathy.  Skin:    General: Skin is warm and dry.     Capillary Refill: Capillary refill takes less than 2 seconds.  Neurological:     Mental Status: She is alert and oriented to person, place, and time.     Cranial Nerves: No cranial nerve deficit or facial asymmetry.     Sensory: No sensory deficit.     Motor: No weakness.     Coordination: Coordination normal.     Gait: Gait normal.  Psychiatric:        Mood and Affect: Mood normal.        Speech: Speech normal.        Behavior: Behavior normal.     BP 120/64 (BP Location: Right Arm, Patient Position: Sitting, Cuff Size: Large)   Pulse 70   Temp (!) 96.9 F (36.1 C) (Temporal)   Ht '5\' 7"'$  (1.702 m)   Wt 168 lb (76.2 kg)   SpO2 98%   BMI 26.31 kg/m  Wt Readings from Last 3 Encounters:  09/13/21 168 lb (76.2 kg)  07/05/21 167 lb (75.8 kg)  06/09/20 176 lb 6.4 oz (80 kg)       Assessment & Plan:   Problem List Items Addressed This Visit       Other   Abdominal bloating    Exam benign. Should resolve.       Acute cough    Negative Covid test. Improving. Suspect viral illness      Acute right-sided low back pain without sciatica    UA negative. Most likely MSK etiology with recent heavy lifting of a rug.       Relevant Orders   POCT urinalysis dipstick (Completed)   Body aches - Primary    Negative Covid test. Viral syndrome. Take NSAIDs or Tylenol. Follow up as needed.       Relevant Orders   POC COVID-19 (Completed)   Fatigue    Viral syndrome most likely. Check labs.       Relevant Orders   POC COVID-19 (Completed)   CBC with Differential/Platelet (Completed)   Comprehensive metabolic panel (Completed)   Troponin I (High Sensitivity) (Completed)    D-Dimer, Quantitative   Nausea    zofran prescribed. Suspect part of viral illness      Relevant Medications   ondansetron (ZOFRAN-ODT) 4 MG disintegrating tablet   Recent travel on aircraft    Increases risk for DVT/PE as a result but unlikely based on examination. D-dimer ordered as precautionary      Relevant Orders   D-Dimer, Quantitative   Shortness of breath    Exam benign. She is able to exercise without difficulty. Check labs including D-dimer and troponin. No cardiac symptoms.       Relevant Orders   CBC with Differential/Platelet (Completed)   Comprehensive metabolic panel (Completed)   Troponin I (High Sensitivity) (Completed)   D-Dimer, Quantitative   Strict precautions to follow-up if any new or worsening symptoms or if she is worsening and our office is closed, she will go to the ED.  I am having Adolphus Birchwood. Gann start on ondansetron. I am also having her maintain her calcium-vitamin D, hydroxychloroquine, rosuvastatin, amLODipine, and escitalopram.  Meds ordered this encounter  Medications   ondansetron (ZOFRAN-ODT) 4 MG disintegrating tablet  Sig: Take 1 tablet (4 mg total) by mouth every 8 (eight) hours as needed for nausea or vomiting.    Dispense:  20 tablet    Refill:  0    Order Specific Question:   Supervising Provider    Answer:   Pricilla Holm A [[id number]]

## 2021-10-01 ENCOUNTER — Other Ambulatory Visit: Payer: Self-pay | Admitting: Internal Medicine

## 2021-10-04 DIAGNOSIS — L821 Other seborrheic keratosis: Secondary | ICD-10-CM | POA: Diagnosis not present

## 2021-10-04 DIAGNOSIS — D2271 Melanocytic nevi of right lower limb, including hip: Secondary | ICD-10-CM | POA: Diagnosis not present

## 2021-10-04 DIAGNOSIS — Z7189 Other specified counseling: Secondary | ICD-10-CM | POA: Diagnosis not present

## 2021-10-04 DIAGNOSIS — Z85828 Personal history of other malignant neoplasm of skin: Secondary | ICD-10-CM | POA: Diagnosis not present

## 2021-10-04 DIAGNOSIS — L814 Other melanin hyperpigmentation: Secondary | ICD-10-CM | POA: Diagnosis not present

## 2021-10-04 DIAGNOSIS — Z08 Encounter for follow-up examination after completed treatment for malignant neoplasm: Secondary | ICD-10-CM | POA: Diagnosis not present

## 2021-10-04 DIAGNOSIS — L7211 Pilar cyst: Secondary | ICD-10-CM | POA: Diagnosis not present

## 2021-10-04 DIAGNOSIS — D225 Melanocytic nevi of trunk: Secondary | ICD-10-CM | POA: Diagnosis not present

## 2021-10-04 DIAGNOSIS — L738 Other specified follicular disorders: Secondary | ICD-10-CM | POA: Diagnosis not present

## 2021-10-10 ENCOUNTER — Encounter: Payer: Self-pay | Admitting: Emergency Medicine

## 2021-10-10 ENCOUNTER — Ambulatory Visit: Payer: Medicare PPO | Admitting: Emergency Medicine

## 2021-10-10 VITALS — BP 122/64 | HR 65 | Temp 98.2°F | Ht 67.0 in | Wt 167.5 lb

## 2021-10-10 DIAGNOSIS — R21 Rash and other nonspecific skin eruption: Secondary | ICD-10-CM | POA: Diagnosis not present

## 2021-10-10 LAB — CBC WITH DIFFERENTIAL/PLATELET
Basophils Absolute: 0.1 10*3/uL (ref 0.0–0.1)
Basophils Relative: 1.3 % (ref 0.0–3.0)
Eosinophils Absolute: 0.2 10*3/uL (ref 0.0–0.7)
Eosinophils Relative: 4 % (ref 0.0–5.0)
HCT: 38.7 % (ref 36.0–46.0)
Hemoglobin: 12.6 g/dL (ref 12.0–15.0)
Lymphocytes Relative: 19.2 % (ref 12.0–46.0)
Lymphs Abs: 0.9 10*3/uL (ref 0.7–4.0)
MCHC: 32.5 g/dL (ref 30.0–36.0)
MCV: 94.8 fl (ref 78.0–100.0)
Monocytes Absolute: 0.4 10*3/uL (ref 0.1–1.0)
Monocytes Relative: 8.9 % (ref 3.0–12.0)
Neutro Abs: 3 10*3/uL (ref 1.4–7.7)
Neutrophils Relative %: 66.6 % (ref 43.0–77.0)
Platelets: 261 10*3/uL (ref 150.0–400.0)
RBC: 4.09 Mil/uL (ref 3.87–5.11)
RDW: 15.2 % (ref 11.5–15.5)
WBC: 4.5 10*3/uL (ref 4.0–10.5)

## 2021-10-10 LAB — COMPREHENSIVE METABOLIC PANEL
ALT: 18 U/L (ref 0–35)
AST: 22 U/L (ref 0–37)
Albumin: 4 g/dL (ref 3.5–5.2)
Alkaline Phosphatase: 61 U/L (ref 39–117)
BUN: 16 mg/dL (ref 6–23)
CO2: 31 mEq/L (ref 19–32)
Calcium: 9.2 mg/dL (ref 8.4–10.5)
Chloride: 103 mEq/L (ref 96–112)
Creatinine, Ser: 0.69 mg/dL (ref 0.40–1.20)
GFR: 82.46 mL/min (ref >60.00)
Glucose, Bld: 75 mg/dL (ref 70–99)
Potassium: 4.2 mEq/L (ref 3.5–5.1)
Sodium: 141 mEq/L (ref 135–145)
Total Bilirubin: 0.5 mg/dL (ref 0.2–1.2)
Total Protein: 7.4 g/dL (ref 6.0–8.3)

## 2021-10-10 LAB — SEDIMENTATION RATE: Sed Rate: 43 mm/hr — ABNORMAL HIGH (ref 0–30)

## 2021-10-10 NOTE — Progress Notes (Signed)
[name] 81 y.o.   Chief Complaint  Patient presents with   bumps    Patient has tiny red bumps on her legs, noticed them this am     HISTORY OF PRESENT ILLNESS: Acute problem visit today, patient of Dr. Billey Gosling. This is a 81 y.o. female complaining of little tiny red bumps to both legs that appeared this morning.  Denies trauma or injuries.  Not itching.  No associated symptomatology. History of rheumatoid arthritis.  No new medications. Unknown trigger but was in contact with someone else's dog yesterday. No other complaints or medical concerns today.  HPI   Prior to Admission medications   Medication Sig Start Date End Date Taking? Authorizing Provider  amLODipine (NORVASC) 5 MG tablet TAKE ONE TABLET BY MOUTH DAILY 07/05/21  Yes Burns, Claudina Lick, MD  calcium-vitamin D (OSCAL WITH D) 500-200 MG-UNIT tablet Calcium 500 + D (D3)  1 po qd otc   Yes [provider]  escitalopram (LEXAPRO) 5 MG tablet TAKE ONE TABLET BY MOUTH ONCE DAILY 10/01/21  Yes Burns, Claudina Lick, MD  hydroxychloroquine (PLAQUENIL) 200 MG tablet Take 200 mg by mouth 2 (two) times daily.   Yes [provider]  ondansetron (ZOFRAN-ODT) 4 MG disintegrating tablet Take 1 tablet (4 mg total) by mouth every 8 (eight) hours as needed for nausea or vomiting. 09/13/21  Yes Henson, Vickie L, NP-C  rosuvastatin (CRESTOR) 5 MG tablet TAKE ONE TABLET BY MOUTH THREE TIMES A WEEK 04/12/21  Yes Binnie Rail, MD    No Known Allergies  Patient Active Problem List   Diagnosis Date Noted   Body aches 09/13/2021   Fatigue 09/13/2021   Acute right-sided low back pain without sciatica 09/13/2021   Nausea 09/13/2021   Shortness of breath 09/13/2021   Acute cough 09/13/2021   Abdominal bloating 09/13/2021   Recent travel on aircraft 09/13/2021   Excessive cerumen in both ear canals 07/05/2021   Subclinical hypothyroidism 07/04/2021   Joint pain 07/21/2019   Rash and nonspecific skin eruption 07/21/2019    Pain in left knee 03/25/2019   Osteopenia 01/16/2019   Anxiety 12/01/2018   Hypertension 07/23/2018   Family history of diabetes mellitus (DM) 07/20/2018   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   Personal history of other malignant neoplasm of skin 03/05/2017   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   Vitamin D deficiency 02/06/2010   VERTIGO 07/25/2009   DIVERTICULOSIS, COLON 10/21/2008   PALPITATIONS, OCCASIONAL 10/21/2008   History of colonic polyps 10/31/2006   Dyslipidemia 07/01/2006    Past Medical History:  Diagnosis Date   Cancer ([place])    skin  cancer Nose   COLONIC POLYPS, HX OF 10/31/2006   DIVERTICULOSIS, COLON 10/21/2008   ELEVATED BP READING WITHOUT DX HYPERTENSION 07/12/2009   HYPERLIPIDEMIA 07/01/2006   Hypertension    PALPITATIONS, OCCASIONAL 10/21/2008   VERTIGO 07/25/2009    Past Surgical History:  Procedure Laterality Date   ABDOMINAL HYSTERECTOMY     COLONOSCOPY  02/24/2013   LAMINECTOMY     lumbar   ROTATOR CUFF REPAIR      Social History   Socioeconomic History   Marital status: Widowed    Spouse name: Not on file   Number of children: Not on file   Years of education: Not on file   Highest education level: Not on file  Occupational History   Not on file  Tobacco Use   Smoking status: Never  Smokeless tobacco: Never  Vaping Use   Vaping Use: Never used  Substance and Sexual Activity   Alcohol use: Yes    Comment: wine occ   Drug use: No   Sexual activity: Not Currently  Other Topics Concern   Not on file  Social History Narrative   Not on file   Social Determinants of Health   Financial Resource Strain: Low Risk  (11/11/2019)   Overall Financial Resource Strain (CARDIA)    Difficulty of Paying Living Expenses: Not hard at all  Food Insecurity: No Food Insecurity (11/11/2019)   Hunger Vital Sign    Worried About Running Out of Food in the Last Year: Never true    Ran Out of  Food in the Last Year: Never true  Transportation Needs: No Transportation Needs (11/11/2019)   PRAPARE - Hydrologist (Medical): No    Lack of Transportation (Non-Medical): No  Physical Activity: Sufficiently Active (11/11/2019)   Exercise Vital Sign    Days of Exercise per Week: 7 days    Minutes of Exercise per Session: 60 min  Stress: No Stress Concern Present (11/11/2019)   [place]    Feeling of Stress : Not at all  Social Connections: Moderately Integrated (11/11/2019)   Social Connection and Isolation Panel [NHANES]    Frequency of Communication with Friends and Family: More than three times a week    Frequency of Social Gatherings with Friends and Family: More than three times a week    Attends Religious Services: More than 4 times per year    Active Member of [employer] or Organizations: Yes    Attends Archivist Meetings: More than 4 times per year    Marital Status: Widowed  Intimate Partner Violence: Not At Risk (11/11/2019)   Humiliation, Afraid, Rape, and Kick questionnaire    Fear of Current or Ex-Partner: No    Emotionally Abused: No    Physically Abused: No    Sexually Abused: No    Family History  Problem Relation Age of Onset   Diabetes Sister    Diabetes Brother    Melanoma Brother    Colon polyps Brother    Diabetes Mother    COPD Mother    Hypertension Father    Colon cancer Neg Hx    Pancreatic cancer Neg Hx    Stomach cancer Neg Hx    Esophageal cancer Neg Hx    Rectal cancer Neg Hx      Review of Systems  Constitutional: Negative.  Negative for chills and fever.  HENT: Negative.  Negative for congestion and sore throat.   Respiratory: Negative.  Negative for cough and shortness of breath.   Cardiovascular: Negative.  Negative for chest pain and palpitations.  Gastrointestinal:  Negative for abdominal pain, diarrhea, nausea and vomiting.   Genitourinary: Negative.   Skin:  Positive for rash. Negative for itching.  Neurological:  Negative for dizziness and headaches.  All other systems reviewed and are negative.  Today's Vitals   10/10/21 0958  BP: 122/64  Pulse: 65  Temp: 98.2 F (36.8 C)  TempSrc: Oral  SpO2: 92%  Weight: 167 lb 8 oz (76 kg)  Height: '5\' 7"'$  (1.702 m)   Body mass index is 26.23 kg/m.   Physical Exam Vitals reviewed.  Constitutional:      Appearance: Normal appearance.  HENT:     Head: Normocephalic.  Eyes:  Extraocular Movements: Extraocular movements intact.  Cardiovascular:     Rate and Rhythm: Normal rate.  Pulmonary:     Effort: Pulmonary effort is normal.  Skin:    General: Skin is warm and dry.     Capillary Refill: Capillary refill takes less than 2 seconds.     Findings: Rash present.     Comments: Lower extremities show pinpoint red lesions from thighs all the way down to ankles Some of them are palpable.  Nontender.  No signs of cellulitis or DVT.  Neurological:     General: No focal deficit present.     Mental Status: She is alert and oriented to person, place, and time.  Psychiatric:        Mood and Affect: Mood normal.        Behavior: Behavior normal.      ASSESSMENT & PLAN: A total of 33 minutes was spent with the patient and counseling/coordination of care regarding preparing for this visit, review of available medical records, review of chronic medical problems and their management, review of all medications, diagnosis of possible transient vasculitic process, need for blood work, prognosis, documentation, and need for follow-up if no better or worse during the next several days.  Problem List Items Addressed This Visit       Musculoskeletal and Integument   Rash and nonspecific skin eruption - Primary    Unknown trigger.  No hives.  No systemic symptoms. Most likely transient vasculitic process.  Will observe. Blood work done today.  No specific treatment  at this time. Advised to contact the office if no better or worse during the next several days.      Relevant Orders   CBC with Differential/Platelet   Comprehensive metabolic panel   Sedimentation rate   Patient Instructions  Rash, Adult  A rash is a change in the color of your skin. A rash can also change the way your skin feels. There are many different conditions and factors that can cause a rash. Follow these instructions at home: The goal of treatment is to stop the itching and keep the rash from spreading. Watch for any changes in your symptoms. Let your doctor know about them. Follow these instructions to help with your condition: Medicine Take or apply over-the-counter and prescription medicines only as told by your doctor. These may include medicines: To treat red or swollen skin (corticosteroid creams). To treat itching. To treat an allergy (oral antihistamines). To treat very bad symptoms (oral corticosteroids).  Skin care Put cool cloths (compresses) on the affected areas. Do not scratch or rub your skin. Avoid covering the rash. Make sure that the rash is exposed to air as much as possible. Managing itching and discomfort Avoid hot showers or baths. These can make itching worse. A cold shower may help. Try taking a bath with: Epsom salts. You can get these at your local pharmacy or grocery store. Follow the instructions on the package. Baking soda. Pour a small amount into the bath as told by your doctor. Colloidal oatmeal. You can get this at your local pharmacy or grocery store. Follow the instructions on the package. Try putting baking soda paste onto your skin. Stir water into baking soda until it gets like a paste. Try putting on a lotion that relieves itchiness (calamine lotion). Keep cool and out of the sun. Sweating and being hot can make itching worse. General instructions  Rest as needed. Drink enough fluid to keep your pee (urine) pale yellow.  Wear  loose-fitting clothing. Avoid scented soaps, detergents, and perfumes. Use gentle soaps, detergents, perfumes, and other cosmetic products. Avoid anything that causes your rash. Keep a journal to help track what causes your rash. Write down: What you eat. What cosmetic products you use. What you drink. What you wear. This includes jewelry. Keep all follow-up visits as told by your doctor. This is important. Contact a doctor if: You sweat at night. You lose weight. You pee (urinate) more than normal. You pee less than normal, or you notice that your pee is a darker color than normal. You feel weak. You throw up (vomit). Your skin or the whites of your eyes look yellow (jaundice). Your skin: Tingles. Is numb. Your rash: Does not go away after a few days. Gets worse. You are: More thirsty than normal. More tired than normal. You have: New symptoms. Pain in your belly (abdomen). A fever. Watery poop (diarrhea). Get help right away if: You have a fever and your symptoms suddenly get worse. You start to feel mixed up (confused). You have a very bad headache or a stiff neck. You have very bad joint pains or stiffness. You have jerky movements that you cannot control (seizure). Your rash covers all or most of your body. The rash may or may not be painful. You have blisters that: Are on top of the rash. Grow larger. Grow together. Are painful. Are inside your nose or mouth. You have a rash that: Looks like purple pinprick-sized spots all over your body. Has a "bull's eye" or looks like a target. Is red and painful, causes your skin to peel, and is not from being in the sun too long. Summary A rash is a change in the color of your skin. A rash can also change the way your skin feels. The goal of treatment is to stop the itching and keep the rash from spreading. Take or apply over-the-counter and prescription medicines only as told by your doctor. Contact a doctor if you have  new symptoms or symptoms that get worse. Keep all follow-up visits as told by your doctor. This is important. This information is not intended to replace advice given to you by your health care provider. Make sure you discuss any questions you have with your health care provider. Document Revised: 10/19/2020 Document Reviewed: 10/19/2020 Elsevier Patient Education  [place], MD [facility]

## 2021-10-10 NOTE — Patient Instructions (Signed)
Rash, Adult  A rash is a change in the color of your skin. A rash can also change the way your skin feels. There are many different conditions and factors that can cause a rash. Follow these instructions at home: The goal of treatment is to stop the itching and keep the rash from spreading. Watch for any changes in your symptoms. Let your doctor know about them. Follow these instructions to help with your condition: Medicine Take or apply over-the-counter and prescription medicines only as told by your doctor. These may include medicines: To treat red or swollen skin (corticosteroid creams). To treat itching. To treat an allergy (oral antihistamines). To treat very bad symptoms (oral corticosteroids).  Skin care Put cool cloths (compresses) on the affected areas. Do not scratch or rub your skin. Avoid covering the rash. Make sure that the rash is exposed to air as much as possible. Managing itching and discomfort Avoid hot showers or baths. These can make itching worse. A cold shower may help. Try taking a bath with: Epsom salts. You can get these at your local pharmacy or grocery store. Follow the instructions on the package. Baking soda. Pour a small amount into the bath as told by your doctor. Colloidal oatmeal. You can get this at your local pharmacy or grocery store. Follow the instructions on the package. Try putting baking soda paste onto your skin. Stir water into baking soda until it gets like a paste. Try putting on a lotion that relieves itchiness (calamine lotion). Keep cool and out of the sun. Sweating and being hot can make itching worse. General instructions  Rest as needed. Drink enough fluid to keep your pee (urine) pale yellow. Wear loose-fitting clothing. Avoid scented soaps, detergents, and perfumes. Use gentle soaps, detergents, perfumes, and other cosmetic products. Avoid anything that causes your rash. Keep a journal to help track what causes your rash. Write  down: What you eat. What cosmetic products you use. What you drink. What you wear. This includes jewelry. Keep all follow-up visits as told by your doctor. This is important. Contact a doctor if: You sweat at night. You lose weight. You pee (urinate) more than normal. You pee less than normal, or you notice that your pee is a darker color than normal. You feel weak. You throw up (vomit). Your skin or the whites of your eyes look yellow (jaundice). Your skin: Tingles. Is numb. Your rash: Does not go away after a few days. Gets worse. You are: More thirsty than normal. More tired than normal. You have: New symptoms. Pain in your belly (abdomen). A fever. Watery poop (diarrhea). Get help right away if: You have a fever and your symptoms suddenly get worse. You start to feel mixed up (confused). You have a very bad headache or a stiff neck. You have very bad joint pains or stiffness. You have jerky movements that you cannot control (seizure). Your rash covers all or most of your body. The rash may or may not be painful. You have blisters that: Are on top of the rash. Grow larger. Grow together. Are painful. Are inside your nose or mouth. You have a rash that: Looks like purple pinprick-sized spots all over your body. Has a "bull's eye" or looks like a target. Is red and painful, causes your skin to peel, and is not from being in the sun too long. Summary A rash is a change in the color of your skin. A rash can also change the way your skin   feels. The goal of treatment is to stop the itching and keep the rash from spreading. Take or apply over-the-counter and prescription medicines only as told by your doctor. Contact a doctor if you have new symptoms or symptoms that get worse. Keep all follow-up visits as told by your doctor. This is important. This information is not intended to replace advice given to you by your health care provider. Make sure you discuss any  questions you have with your health care provider. Document Revised: 10/19/2020 Document Reviewed: 10/19/2020 Elsevier Patient Education  2023 Elsevier Inc.  

## 2021-10-10 NOTE — Assessment & Plan Note (Signed)
Unknown trigger.  No hives.  No systemic symptoms. Most likely transient vasculitic process.  Will observe. Blood work done today.  No specific treatment at this time. Advised to contact the office if no better or worse during the next several days.

## 2022-01-28 DIAGNOSIS — E559 Vitamin D deficiency, unspecified: Secondary | ICD-10-CM | POA: Diagnosis not present

## 2022-01-28 DIAGNOSIS — R768 Other specified abnormal immunological findings in serum: Secondary | ICD-10-CM | POA: Diagnosis not present

## 2022-01-28 DIAGNOSIS — M154 Erosive (osteo)arthritis: Secondary | ICD-10-CM | POA: Diagnosis not present

## 2022-01-28 DIAGNOSIS — M11231 Other chondrocalcinosis, right wrist: Secondary | ICD-10-CM | POA: Diagnosis not present

## 2022-01-28 DIAGNOSIS — E785 Hyperlipidemia, unspecified: Secondary | ICD-10-CM | POA: Diagnosis not present

## 2022-01-28 DIAGNOSIS — M159 Polyosteoarthritis, unspecified: Secondary | ICD-10-CM | POA: Diagnosis not present

## 2022-01-28 DIAGNOSIS — M25541 Pain in joints of right hand: Secondary | ICD-10-CM | POA: Diagnosis not present

## 2022-01-28 DIAGNOSIS — M25562 Pain in left knee: Secondary | ICD-10-CM | POA: Diagnosis not present

## 2022-01-28 DIAGNOSIS — R5382 Chronic fatigue, unspecified: Secondary | ICD-10-CM | POA: Diagnosis not present

## 2022-02-16 ENCOUNTER — Other Ambulatory Visit: Payer: Self-pay | Admitting: Internal Medicine

## 2022-03-06 ENCOUNTER — Other Ambulatory Visit: Payer: Self-pay | Admitting: Internal Medicine

## 2022-05-06 ENCOUNTER — Ambulatory Visit: Payer: Medicare PPO | Admitting: Internal Medicine

## 2022-05-06 ENCOUNTER — Other Ambulatory Visit: Payer: Self-pay

## 2022-05-06 ENCOUNTER — Emergency Department (HOSPITAL_BASED_OUTPATIENT_CLINIC_OR_DEPARTMENT_OTHER): Payer: Medicare PPO | Admitting: Radiology

## 2022-05-06 ENCOUNTER — Encounter: Payer: Self-pay | Admitting: Internal Medicine

## 2022-05-06 ENCOUNTER — Emergency Department (HOSPITAL_BASED_OUTPATIENT_CLINIC_OR_DEPARTMENT_OTHER)
Admission: EM | Admit: 2022-05-06 | Discharge: 2022-05-06 | Disposition: A | Discharge status: Home / Self Care | Payer: Medicare PPO | Attending: Emergency Medicine | Admitting: Emergency Medicine

## 2022-05-06 DIAGNOSIS — M11231 Other chondrocalcinosis, right wrist: Secondary | ICD-10-CM | POA: Diagnosis not present

## 2022-05-06 DIAGNOSIS — Y9301 Activity, walking, marching and hiking: Secondary | ICD-10-CM | POA: Insufficient documentation

## 2022-05-06 DIAGNOSIS — S6991XA Unspecified injury of right wrist, hand and finger(s), initial encounter: Secondary | ICD-10-CM | POA: Diagnosis present

## 2022-05-06 DIAGNOSIS — W19XXXA Unspecified fall, initial encounter: Secondary | ICD-10-CM | POA: Insufficient documentation

## 2022-05-06 DIAGNOSIS — M19031 Primary osteoarthritis, right wrist: Secondary | ICD-10-CM | POA: Diagnosis not present

## 2022-05-06 DIAGNOSIS — S63501A Unspecified sprain of right wrist, initial encounter: Secondary | ICD-10-CM | POA: Diagnosis not present

## 2022-05-06 DIAGNOSIS — M19041 Primary osteoarthritis, right hand: Secondary | ICD-10-CM | POA: Diagnosis not present

## 2022-05-06 NOTE — ED Notes (Signed)
Pt provided with ice pack.

## 2022-05-06 NOTE — ED Notes (Signed)
Patient verbalizes understanding of discharge instructions. Opportunity for questioning and answers were provided. Patient discharged from ED.  °

## 2022-05-06 NOTE — ED Triage Notes (Signed)
Patient arrives with complaints of worsening right wrist pain related to a fall 2 days ago. Patient had mechanical fall and landed on her right wrist. Increased swelling a pain to site. Rates pain a 6/10. Did not hit her head.

## 2022-05-06 NOTE — ED Provider Notes (Signed)
[place] EMERGENCY DEPARTMENT AT [facility] Provider Note   CSN: [id number] Arrival date & time: 05/06/22  1153     History  Chief Complaint  Patient presents with   Fall   Wrist Injury    Right     [name] is a 82 y.o. female.   Fall  Wrist Injury    Patient presents to the emergency department due to wrist pain.  4 days ago patient was doing water aerobics with weights, she is having pain to the right wrist.  Started to feel better until 2 days ago when she had a mechanical fall walking the dog.  She landed on outstretched right wrist, having pain to the ulnar side of the wrist since then.  She did not hit her head or lose consciousness, not on any blood thinners.  Home Medications Prior to Admission medications   Medication Sig Start Date End Date Taking? Authorizing Provider  amLODipine (NORVASC) 5 MG tablet TAKE ONE TABLET BY MOUTH DAILY 02/18/22   Pincus Sanes, MD  calcium-vitamin D (OSCAL WITH D) 500-200 MG-UNIT tablet Calcium 500 + D (D3)  1 po qd otc    [provider]  escitalopram (LEXAPRO) 5 MG tablet TAKE ONE TABLET BY MOUTH ONCE DAILY 03/06/22   Pincus Sanes, MD  hydroxychloroquine (PLAQUENIL) 200 MG tablet Take 200 mg by mouth 2 (two) times daily.    [provider]  ondansetron (ZOFRAN-ODT) 4 MG disintegrating tablet Take 1 tablet (4 mg total) by mouth every 8 (eight) hours as needed for nausea or vomiting. 09/13/21   Henson, Vickie L, NP-C  rosuvastatin (CRESTOR) 5 MG tablet TAKE ONE TABLET BY MOUTH THREE TIMES A WEEK 04/12/21   Pincus Sanes, MD      Allergies    Patient has no known allergies.    Review of Systems   Review of Systems  Physical Exam Updated Vital Signs BP (!) 140/64   Pulse 67   Temp 98.2 F (36.8 C) (Oral)   Resp 18   Ht  (1.702 m)   Wt 77.1 kg   SpO2 96%   BMI 26.63 kg/m  Physical Exam Vitals and nursing note reviewed. Exam conducted with a chaperone present.  Constitutional:       Appearance: Normal appearance.  HENT:     Head: Normocephalic and atraumatic.  Eyes:     General: No scleral icterus.       Right eye: No discharge.        Left eye: No discharge.     Extraocular Movements: Extraocular movements intact.     Pupils: Pupils are equal, round, and reactive to light.  Cardiovascular:     Rate and Rhythm: Normal rate and regular rhythm.     Pulses: Normal pulses.     Heart sounds: Normal heart sounds.     No friction rub. No gallop.  Pulmonary:     Effort: Pulmonary effort is normal. No respiratory distress.     Breath sounds: Normal breath sounds.  Abdominal:     General: Abdomen is flat. Bowel sounds are normal. There is no distension.     Palpations: Abdomen is soft.     Tenderness: There is no abdominal tenderness.  Musculoskeletal:        General: Tenderness present.     Comments: Tenderness over dorsum of wrist on the ulnar side, is able to make complete fist but tenderness over the dorsum of hand.  No appreciable  crepitus, no bony tenderness of the olecranon process.  Skin:    General: Skin is warm and dry.     Capillary Refill: Capillary refill takes less than 2 seconds.     Coloration: Skin is not jaundiced.  Neurological:     Mental Status: She is alert. Mental status is at baseline.     Coordination: Coordination normal.     ED Results / Procedures / Treatments   Labs (all labs ordered are listed, but only abnormal results are displayed) Labs Reviewed - No data to display  EKG None  Radiology DG Wrist Complete Right  Result Date: 05/06/2022 CLINICAL DATA:  Trauma with right wrist and elbow pain. EXAM: RIGHT WRIST - COMPLETE 3+ VIEW; RIGHT HAND - COMPLETE 3+ VIEW COMPARISON:  Right wrist radiographs 03/05/2016 FINDINGS: Wrist: There is no acute fracture or dislocation. Bony alignment is normal. 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 There is no erosive change. There is degenerative change of the triscaphe joint with  joint space narrowing and subchondral sclerosis, increased since 2018. The soft tissues are unremarkable Hand: There is no acute fracture or dislocation. Bony alignment is normal. There is degenerative change of the thumb interphalangeal joint, index finger DIP joint and middle and ring finger PIP joints with associated joint space narrowing, subchondral sclerosis, and osteophyte formation. There is no evidence of erosive change. The soft tissues are unremarkable. IMPRESSION: 1. No acute fracture or dislocation in the hand or wrist. 2. Degenerative change at the triscaphe joint and multiple interphalangeal joints as above. 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 Electronically Signed   By: Lesia Hausen M.D.   On: 05/06/2022 12:31   DG Hand Complete Right  Result Date: 05/06/2022 CLINICAL DATA:  Trauma with right wrist and elbow pain. EXAM: RIGHT WRIST - COMPLETE 3+ VIEW; RIGHT HAND - COMPLETE 3+ VIEW COMPARISON:  Right wrist radiographs 03/05/2016 FINDINGS: Wrist: There is no acute fracture or dislocation. Bony alignment is normal. 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 There is no erosive change. There is degenerative change of the triscaphe joint with joint space narrowing and subchondral sclerosis, increased since 2018. The soft tissues are unremarkable Hand: There is no acute fracture or dislocation. Bony alignment is normal. There is degenerative change of the thumb interphalangeal joint, index finger DIP joint and middle and ring finger PIP joints with associated joint space narrowing, subchondral sclerosis, and osteophyte formation. There is no evidence of erosive change. The soft tissues are unremarkable. IMPRESSION: 1. No acute fracture or dislocation in the hand or wrist. 2. Degenerative change at the triscaphe joint and multiple interphalangeal joints as above. 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 Electronically Signed   By: Lesia Hausen M.D.   On: 05/06/2022 12:31     Procedures Procedures    Medications Ordered in ED Medications - No data to display  ED Course/ Medical Decision Making/ A&P                             Medical Decision Making Amount and/or Complexity of Data Reviewed Radiology: ordered.   Patient presents to the emergency department due to fall on outstretched right wrist.  She is neurovascular intact with brisk cap refill, radial pulses 2+.  There is no crepitus, there is some bony tenderness over the dorsum of the wrist we will proceed with x-ray.  X-ray is negative for any acute process, when she is neurovascular intact and already has  splint we will have her follow-up with primary and orthopedics.  Do not think any additional workup is indicated emergently based on exam.  Stable for discharge at this time.        Final Clinical Impression(s) / ED Diagnoses Final diagnoses:  Sprain of right wrist, initial encounter    Rx / DC Orders ED Discharge Orders     None         Theron Arista, Cordelia Poche 05/06/22 1251    Alvira Monday, MD 05/06/22 2225

## 2022-05-06 NOTE — Discharge Instructions (Signed)
Your workup today was overall reassuring.  There is no signs of fractures or dislocations at the joints.  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  Return to the ED for new or worsening symptoms.  He can wear your brace for support.  Take Aleve with food and water.

## 2022-05-08 ENCOUNTER — Other Ambulatory Visit: Payer: Self-pay | Admitting: Internal Medicine

## 2022-05-10 ENCOUNTER — Telehealth: Payer: Self-pay

## 2022-05-10 NOTE — Telephone Encounter (Signed)
     Patient  visit on 4/15  at [facility]  Have you been able to follow up with your primary care physician? Yes   The patient was or was not able to obtain any needed medicine or equipment. Yes   Are there diet recommendations that you are having difficulty following? Na   Patient expresses understanding of discharge instructions and education provided has no other needs at this time.  Yes      [name] [facility] Guide, [place] [phone] [address], [place], [place] [address] Phone: [phone] Email: [email or web address]    

## 2022-05-20 DIAGNOSIS — Z7189 Other specified counseling: Secondary | ICD-10-CM | POA: Diagnosis not present

## 2022-05-20 DIAGNOSIS — L7211 Pilar cyst: Secondary | ICD-10-CM | POA: Diagnosis not present

## 2022-05-20 DIAGNOSIS — L738 Other specified follicular disorders: Secondary | ICD-10-CM | POA: Diagnosis not present

## 2022-05-20 DIAGNOSIS — I8390 Asymptomatic varicose veins of unspecified lower extremity: Secondary | ICD-10-CM | POA: Diagnosis not present

## 2022-05-20 DIAGNOSIS — D2271 Melanocytic nevi of right lower limb, including hip: Secondary | ICD-10-CM | POA: Diagnosis not present

## 2022-05-20 DIAGNOSIS — L821 Other seborrheic keratosis: Secondary | ICD-10-CM | POA: Diagnosis not present

## 2022-05-20 DIAGNOSIS — D225 Melanocytic nevi of trunk: Secondary | ICD-10-CM | POA: Diagnosis not present

## 2022-05-20 DIAGNOSIS — L814 Other melanin hyperpigmentation: Secondary | ICD-10-CM | POA: Diagnosis not present

## 2022-05-20 DIAGNOSIS — I788 Other diseases of capillaries: Secondary | ICD-10-CM | POA: Diagnosis not present

## 2022-05-27 ENCOUNTER — Encounter: Payer: Self-pay | Admitting: Internal Medicine

## 2022-05-27 NOTE — Progress Notes (Unsigned)
    Subjective:    Patient ID: [name], female    DOB: 1940/12/23, 82 y.o.   MRN: [id number]      HPI Briane is here for No chief complaint on file.   Thyroid last year - high.       Medications and allergies reviewed with patient and updated if appropriate.  Current Outpatient Medications on File Prior to Visit  Medication Sig Dispense Refill   amLODipine (NORVASC) 5 MG tablet TAKE ONE TABLET BY MOUTH DAILY 90 tablet 1   calcium-vitamin D (OSCAL WITH D) 500-200 MG-UNIT tablet Calcium 500 + D (D3)  1 po qd otc     escitalopram (LEXAPRO) 5 MG tablet TAKE ONE TABLET BY MOUTH ONCE DAILY 30 tablet 3   hydroxychloroquine (PLAQUENIL) 200 MG tablet Take 200 mg by mouth 2 (two) times daily.     ondansetron (ZOFRAN-ODT) 4 MG disintegrating tablet Take 1 tablet (4 mg total) by mouth every 8 (eight) hours as needed for nausea or vomiting. 20 tablet 0   rosuvastatin (CRESTOR) 5 MG tablet TAKE ONE TABLET BY MOUTH THREE TIMES A WEEK 39 tablet 1   No current facility-administered medications on file prior to visit.    Review of Systems     Objective:  There were no vitals filed for this visit. BP Readings from Last 3 Encounters:  05/06/22 (!) 140/64  10/10/21 122/64  09/13/21 120/64   Wt Readings from Last 3 Encounters:  05/06/22 170 lb (77.1 kg)  10/10/21 167 lb 8 oz (76 kg)  09/13/21 168 lb (76.2 kg)   There is no height or weight on file to calculate BMI.    Physical Exam         Assessment & Plan:    See Problem List for Assessment and Plan of chronic medical problems.

## 2022-05-27 NOTE — Patient Instructions (Signed)
      Blood work was ordered.   The lab is on the first floor.    Medications changes include :       A referral was ordered for XXX.     Someone will call you to schedule an appointment.    No follow-ups on file.  

## 2022-05-28 ENCOUNTER — Ambulatory Visit (INDEPENDENT_AMBULATORY_CARE_PROVIDER_SITE_OTHER): Payer: Medicare PPO | Admitting: Internal Medicine

## 2022-05-28 VITALS — BP 120/68 | HR 58 | Temp 98.3°F | Ht 67.0 in | Wt 174.0 lb

## 2022-05-28 DIAGNOSIS — M6289 Other specified disorders of muscle: Secondary | ICD-10-CM | POA: Insufficient documentation

## 2022-05-28 DIAGNOSIS — E038 Other specified hypothyroidism: Secondary | ICD-10-CM

## 2022-05-28 DIAGNOSIS — F419 Anxiety disorder, unspecified: Secondary | ICD-10-CM | POA: Diagnosis not present

## 2022-05-28 DIAGNOSIS — I10 Essential (primary) hypertension: Secondary | ICD-10-CM

## 2022-05-28 DIAGNOSIS — E785 Hyperlipidemia, unspecified: Secondary | ICD-10-CM

## 2022-05-28 DIAGNOSIS — R0609 Other forms of dyspnea: Secondary | ICD-10-CM | POA: Diagnosis not present

## 2022-05-28 DIAGNOSIS — R5383 Other fatigue: Secondary | ICD-10-CM

## 2022-05-28 LAB — CBC WITH DIFFERENTIAL/PLATELET
Basophils Absolute: 0.1 10*3/uL (ref 0.0–0.1)
Basophils Relative: 2.3 % (ref 0.0–3.0)
Eosinophils Absolute: 0.2 10*3/uL (ref 0.0–0.7)
Eosinophils Relative: 4 % (ref 0.0–5.0)
HCT: 36.7 % (ref 36.0–46.0)
Hemoglobin: 12.3 g/dL (ref 12.0–15.0)
Lymphocytes Relative: 28.2 % (ref 12.0–46.0)
Lymphs Abs: 1.1 10*3/uL (ref 0.7–4.0)
MCHC: 33.4 g/dL (ref 30.0–36.0)
MCV: 95 fl (ref 78.0–100.0)
Monocytes Absolute: 0.5 10*3/uL (ref 0.1–1.0)
Monocytes Relative: 12.3 % — ABNORMAL HIGH (ref 3.0–12.0)
Neutro Abs: 2.2 10*3/uL (ref 1.4–7.7)
Neutrophils Relative %: 53.2 % (ref 43.0–77.0)
Platelets: 294 10*3/uL (ref 150.0–400.0)
RBC: 3.87 Mil/uL (ref 3.87–5.11)
RDW: 14.5 % (ref 11.5–15.5)
WBC: 4.1 10*3/uL (ref 4.0–10.5)

## 2022-05-28 LAB — COMPREHENSIVE METABOLIC PANEL
ALT: 17 U/L (ref 0–35)
AST: 18 U/L (ref 0–37)
Albumin: 3.9 g/dL (ref 3.5–5.2)
Alkaline Phosphatase: 60 U/L (ref 39–117)
BUN: 16 mg/dL (ref 6–23)
CO2: 31 mEq/L (ref 19–32)
Calcium: 8.8 mg/dL (ref 8.4–10.5)
Chloride: 104 mEq/L (ref 96–112)
Creatinine, Ser: 0.57 mg/dL (ref 0.40–1.20)
GFR: 85.96 mL/min (ref >60.00)
Glucose, Bld: 87 mg/dL (ref 70–99)
Potassium: 3.7 mEq/L (ref 3.5–5.1)
Sodium: 141 mEq/L (ref 135–145)
Total Bilirubin: 0.3 mg/dL (ref 0.2–1.2)
Total Protein: 7.5 g/dL (ref 6.0–8.3)

## 2022-05-28 LAB — LIPID PANEL
Cholesterol: 217 mg/dL — ABNORMAL HIGH (ref 0–200)
HDL: 97.1 mg/dL (ref >39.00)
LDL Cholesterol: 108 mg/dL — ABNORMAL HIGH (ref 0–99)
NonHDL: 120.38
Total CHOL/HDL Ratio: 2
Triglycerides: 64 mg/dL (ref 0.0–149.0)
VLDL: 12.8 mg/dL (ref 0.0–40.0)

## 2022-05-28 LAB — SEDIMENTATION RATE: Sed Rate: 44 mm/hr — ABNORMAL HIGH (ref 0–30)

## 2022-05-28 LAB — TSH: TSH: 5.39 u[IU]/mL (ref 0.35–5.50)

## 2022-05-28 LAB — C-REACTIVE PROTEIN: CRP: 3.6 mg/dL (ref 0.5–20.0)

## 2022-05-28 NOTE — Assessment & Plan Note (Signed)
Acute States muscle fatigue, in addition to shortness of breath with exertion and fatigue Check ESR, CRP to rule out PMR ?  Flare of autoimmune disease Concern for subclinical hypothyroidism that has gotten worse-check TSH Check CBC, CMP

## 2022-05-28 NOTE — Assessment & Plan Note (Signed)
Chronic Regular exercise and healthy diet encouraged Check lipid panel  Continue Crestor 5 mg 3 times a week 

## 2022-05-28 NOTE — Assessment & Plan Note (Signed)
Chronic BP well controlled Continue amlodipine 5 mg daily cmp  

## 2022-05-28 NOTE — Assessment & Plan Note (Signed)
New in the last 4-6 weeks Associated with some dyspnea on exertion and muscle fatigue, low stamina ?  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

## 2022-05-28 NOTE — Assessment & Plan Note (Signed)
Chronic Controlled, Stable Continue Lexapro 5 mg daily 

## 2022-05-28 NOTE — Assessment & Plan Note (Signed)
New Started 4-6 weeks ago She has not been exercising for the past 4 weeks which could be contributing, but this is more extreme than just that EKG today: Sinus bradycardia at 54 bpm, possible LAE, possible LVH, nonspecific ST abnormality.  Compared to previous EKG from 12/2018 ST abnormality is new in lead III, bradycardia is new and possible LAE, LVH Check labs including CBC, CMP, TSH Depending on results we will determine if cardiology referral is needed-May need to start thyroid medication

## 2022-05-28 NOTE — Assessment & Plan Note (Signed)
Chronic Her last TSH was quite elevated and I did mention that to her and she never responded about starting medication or follow-up-discussed with her that this potentially could be was causing some of her symptoms Check TSH Most likely will need to start medication

## 2022-05-30 ENCOUNTER — Encounter: Payer: Self-pay | Admitting: Internal Medicine

## 2022-05-30 NOTE — Addendum Note (Signed)
Addended by: Pincus Sanes on: 05/30/2022 08:58 PM   Modules accepted: Orders

## 2022-06-10 NOTE — Therapy (Signed)
OUTPATIENT OCCUPATIONAL THERAPY ORTHO EVALUATION  Patient Name: [name] MRN: [id number] DOB:Oct 14, 1940, 82 y.o., female Today's Date: 06/12/2022  PCP: Cheryll Cockayne, MD REFERRING PROVIDER: Michail Jewels, MD   END OF SESSION:  OT End of Session - 06/12/22 1319     Visit Number 1    Number of Visits 7    Date for OT Re-Evaluation 07/26/22    Authorization Type [employer]    OT Start Time 1319    OT Stop Time 1403    OT Time Calculation (min) 44 min    Activity Tolerance Patient tolerated treatment well;Patient limited by pain;Patient limited by fatigue    Behavior During Therapy [facility] for tasks assessed/performed            Past Medical History:  Diagnosis Date   Cancer (HCC)    skin  cancer Nose   COLONIC POLYPS, HX OF 10/31/2006   DIVERTICULOSIS, COLON 10/21/2008   ELEVATED BP READING WITHOUT DX HYPERTENSION 07/12/2009   HYPERLIPIDEMIA 07/01/2006   Hypertension    PALPITATIONS, OCCASIONAL 10/21/2008   VERTIGO 07/25/2009   Past Surgical History:  Procedure Laterality Date   ABDOMINAL HYSTERECTOMY     COLONOSCOPY  02/24/2013   LAMINECTOMY     lumbar   ROTATOR CUFF REPAIR     Patient Active Problem List   Diagnosis Date Noted   Muscle fatigue 05/28/2022   Body aches 09/13/2021   Fatigue 09/13/2021   Acute right-sided low back pain without sciatica 09/13/2021   Nausea 09/13/2021   DOE (dyspnea on exertion) 09/13/2021   Acute cough 09/13/2021   Abdominal bloating 09/13/2021   Recent travel on aircraft 09/13/2021   Excessive cerumen in both ear canals 07/05/2021   Subclinical hypothyroidism 07/04/2021   Joint pain 07/21/2019   Rash and nonspecific skin eruption 07/21/2019   Pain in left knee 03/25/2019   Osteopenia 01/16/2019   Anxiety 12/01/2018   Hypertension 07/23/2018   Family history of diabetes mellitus (DM) 07/20/2018   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  She just went through 6 weeks of a painful pseudogout flare-up but now she's doing much better but still tender to touch volarly.    PERTINENT HISTORY: Did have fall on 05/06/22, which she states made her wrist worse, but it did resolve.   PRECAUTIONS: None; WEIGHT BEARING RESTRICTIONS: No  PAIN:  Are you having pain? Yes: NPRS scale: 0.5/10 pain now at rest, at worst in past week up to 4/10 Pain location: volar Rt wrist Pain description: dull and stabbing at times Aggravating factors: repetitive motion/pressure  Relieving factors: warm water  FALLS: Has patient fallen  in last 6 months? Yes. Number of falls 1 on 05/06/22  LIVING ENVIRONMENT: Lives with: lives alone and her dog Has following equipment at home:  non slip silicone, jar opener  PLOF: Independent  PATIENT GOALS: To have a management strategy for typically weak, painful hands   OBJECTIVE: (All objective assessments below are from initial evaluation on: 06/12/22 unless otherwise specified.)   HAND DOMINANCE: Right   ADLs: Overall  ADLs: States decreased ability to grab, hold household objects, pain and inability to open containers, perform FMS tasks (manipulate fasteners on clothing), mild to moderate bathing problems as well.    FUNCTIONAL OUTCOME MEASURES: Eval: Quck DASH 41% impairment today  (Higher % Score  =  More Impairment)    UPPER EXTREMITY ROM     Shoulder to Wrist AROM Right eval Left eval  Forearm supination    Forearm pronation     Wrist flexion 41 64  Wrist extension 62 71  Wrist ulnar deviation    Wrist radial deviation    Functional dart thrower's motion (F-DTM) in ulnar flexion 29   F-DTM in radial extension  68   (Blank rows = not tested)  Eval: Rt RF was more stiff- so it will be tracked as an example of all stiff Rt hand fingers today.   Hand AROM Right eval  Full Fist Ability (or Gap to Distal Palmar Crease) 5.1cm gap from tip of RF to [facility] (RF is stiffest today)   Thumb Opposition  (Kapandji Scale)  10 (with some thumb deviation at IP J)   Thumb MCP (0-60)   Thumb IP (0-80)   Thumb Radial Abduction Span   Thumb Palmar Abduction Span   Index MCP (0-90)   Index PIP (0-100)   Index DIP (0-70)    Long MCP (0-90)    Long PIP (0-100)    Long DIP (0-70)    Ring MCP (0-90) (+5) - 105   Ring PIP (0-100)  (-10) - 46   Ring DIP (0-70) 0-  30  Little MCP (0-90)    Little PIP (0-100)    Little DIP (0-70)    (Blank rows = not tested)   UPPER EXTREMITY MMT:    Eval:  NT at eval due pain. Will be tested when appropriate.   MMT Right TBD Left TBD  Wrist flexion    Wrist extension    Wrist ulnar deviation    Wrist radial deviation    (Blank rows = not tested)  HAND FUNCTION: Eval: Observed weakness in affected bil hands. Details TBD  Grip strength Right: TBD lbs, Left: TBD lbs   COORDINATION: Eval: Observed coordination impairments with affected Rt hand, as evidenced by inability to make a fist.  Details TBD 9 Hole Peg Test Right: TBDsec, Left: TBD sec  SENSATION: Eval:   Light touch intact today    EDEMA:   Eval:  Mildly swollen in Rt hand and wrist today, especially volarly  COGNITION: Eval: Overall cognitive status: WFL for evaluation today   OBSERVATIONS:   Eval: She has red, very tender to palpation, puffy area on right volar wrist likely from pseudogout.  She states this is much better than it has been in the past 6 weeks.  Her fingers also have a large Heberden's and Bouchard's nodes in the IP joints, and thumb is also overtly arthritic swollen and has slight collapse deformity.  Left hand is less effected than the dominant right hand.   TODAY'S TREATMENT:  Post-evaluation treatment: She was  given the following home exercise program, but due to the length of evaluation only the first 2 wrist stretches were able to be reviewed with her.  She performs these back with caution to not cause any additional pain.  She was told to try these 2-3 times a day until seen again gently.  She states understanding  Exercises - Tendon Glides  - 4-6 x daily - 3-5 reps - 2-3 seconds hold - Seated Wrist Flexion Stretch  - 3 x daily - 3 reps - 15 hold - Wrist Prayer Stretch  - 3 x daily - 3 reps - 15 sec hold - Stretch Thumb DOWNWARD  - 3 x daily - 3 reps - 15 sec hold - Thumb Webspace Stretch  - 3 x daily - 3 reps - 15 sec hold - PUSH KNUCKLES DOWN  - 4 x daily - 3-5 reps - 15 seconds hold - BACK KNUCKLE STRETCHES   - 4 x daily - 3-5 reps - 15 sec hold - HOOK Stretch  - 4 x daily - 3-5 reps - 15-20 sec hold - Seated Finger Composite Flexion Stretch  - 4 x daily - 3-5 reps - 15 hold - Towel Roll Grip with Forearm in Neutral  - 3 x daily - 5 reps - 10 sec hold    PATIENT EDUCATION: Education details: See tx section above for details  Person educated: Patient Education method: Verbal Instruction, Teach back, Handouts  Education comprehension: States and demonstrates understanding, Additional Education required    HOME EXERCISE PROGRAM: Access Code: [email or web address] URL:  [email or web address] Date: 06/12/2022 Prepared by: Fannie Knee    GOALS: Goals reviewed with patient? Yes   SHORT TERM GOALS: (STG required if POC>30 days) Target Date: 06/28/22  Pt will obtain protective, custom orthotic. Goal status: INITIAL  2.  Pt will demo/state understanding of initial HEP to improve pain levels and prerequisite motion. Goal status: INITIAL   LONG TERM GOALS: Target Date: 07/26/22  Pt will improve functional ability by decreased impairment per Quick DASH assessment from 41% to 20% or better, for better quality of life. Goal status: INITIAL  2.  Pt will improve grip strength in Rt hand from unable/painful to at least 25lbs for functional use at home and in IADLs. Goal status: INITIAL  3.  Pt will improve A/ROM in Rt hand from 5.1cm gap from tip of RF to [facility] to at least 2cm gap or better, to have functional motion for tasks like grasping.  Goal status: INITIAL  4.  Pt will improve strength in Rt wrist flex/ext from painful/unable to at least 4/5 MMT to have increased functional ability to carry out selfcare and higher-level homecare tasks with less difficulty. Goal status: INITIAL  5.  Pt will improve coordination skills in Rt hand, as seen by better score on 9HPT testing to have increased functional ability to carry out fine motor tasks (fasteners, etc.) and more complex, coordinated IADLs (meal prep, sports, etc.).  Goal status: INITIAL- TBD baseline next 1-2 sessions   6.  Pt will decrease pain at worst from 4/10 to 2/10 or better to have better sleep and occupational participation in daily roles. Goal status: INITIAL   ASSESSMENT:  CLINICAL IMPRESSION: Patient is a 82 y.o. female who was seen today for occupational therapy evaluation for bil hand and wrist pain, weakness, tenderness, decreased fnl ability. She will benefit form OP OT to increase quality of life.    PERFORMANCE DEFICITS: in functional skills including ADLs, IADLs,  coordination,  dexterity, edema, ROM, strength, pain, fascial restrictions, muscle spasms, flexibility, Fine motor control, Gross motor control, body mechanics, endurance, decreased knowledge of precautions, and UE functional use, cognitive skills including problem solving and safety awareness, and psychosocial skills including coping strategies, environmental adaptation, and habits.   IMPAIRMENTS: are limiting patient from ADLs, IADLs, rest and sleep, leisure, and social participation.   COMORBIDITIES: may have co-morbidities  that affects occupational performance. Patient will benefit from skilled OT to address above impairments and improve overall function.  MODIFICATION OR ASSISTANCE TO COMPLETE EVALUATION: No modification of tasks or assist necessary to complete an evaluation.  OT OCCUPATIONAL PROFILE AND HISTORY: Detailed assessment: Review of records and additional review of physical, cognitive, psychosocial history related to current functional performance.  CLINICAL DECISION MAKING: Moderate - several treatment options, min-mod task modification necessary  REHAB POTENTIAL: Good  EVALUATION COMPLEXITY: Low      PLAN:  OT FREQUENCY: 1x/week  OT DURATION: 6 weeks  PLANNED INTERVENTIONS: self care/ADL training, therapeutic exercise, therapeutic activity, manual therapy, passive range of motion, splinting, fluidotherapy, compression bandaging, moist heat, cryotherapy, contrast bath, patient/family education, energy conservation, coping strategies training, DME and/or AE instructions, and Dry needling  RECOMMENDED OTHER SERVICES: none now   CONSULTED AND AGREED WITH PLAN OF CARE: Patient  PLAN FOR NEXT SESSION: review HEP, fabricate orthosis for long-term protection of pseudogout    Fannie Knee, OTR/L 06/12/2022, 5:54 PM

## 2022-06-12 ENCOUNTER — Ambulatory Visit: Payer: Medicare PPO | Attending: Rheumatology | Admitting: Rehabilitative and Restorative Service Providers"

## 2022-06-12 ENCOUNTER — Other Ambulatory Visit: Payer: Self-pay

## 2022-06-12 ENCOUNTER — Encounter: Payer: Self-pay | Admitting: Rehabilitative and Restorative Service Providers"

## 2022-06-12 DIAGNOSIS — M25642 Stiffness of left hand, not elsewhere classified: Secondary | ICD-10-CM | POA: Diagnosis not present

## 2022-06-12 DIAGNOSIS — M6281 Muscle weakness (generalized): Secondary | ICD-10-CM | POA: Diagnosis not present

## 2022-06-12 DIAGNOSIS — R600 Localized edema: Secondary | ICD-10-CM | POA: Insufficient documentation

## 2022-06-12 DIAGNOSIS — M25531 Pain in right wrist: Secondary | ICD-10-CM | POA: Diagnosis not present

## 2022-06-12 DIAGNOSIS — M25641 Stiffness of right hand, not elsewhere classified: Secondary | ICD-10-CM | POA: Insufficient documentation

## 2022-06-21 ENCOUNTER — Encounter: Payer: Medicare PPO | Admitting: Rehabilitative and Restorative Service Providers"

## 2022-06-25 ENCOUNTER — Encounter: Payer: Medicare PPO | Admitting: Rehabilitative and Restorative Service Providers"

## 2022-06-26 ENCOUNTER — Ambulatory Visit: Payer: Medicare PPO | Attending: Rheumatology | Admitting: Rehabilitative and Restorative Service Providers"

## 2022-07-03 ENCOUNTER — Ambulatory Visit: Payer: Medicare PPO | Admitting: Rehabilitative and Restorative Service Providers"

## 2022-07-07 ENCOUNTER — Encounter: Payer: Self-pay | Admitting: Internal Medicine

## 2022-07-07 NOTE — Progress Notes (Unsigned)
Subjective:    Patient ID: [name], female    DOB: 1940-06-24, 82 y.o.   MRN: [id number]      HPI [name] is here for a Physical exam and her chronic medical problems.   Was here 6 weeks ago for fatigue, DOE, muscle fatigue.  Had labs   Medications and allergies reviewed with patient and updated if appropriate.  Current Outpatient Medications on File Prior to Visit  Medication Sig Dispense Refill   amLODipine (NORVASC) 5 MG tablet TAKE ONE TABLET BY MOUTH DAILY 90 tablet 1   Calcium Carb-Cholecalciferol (OYSTER SHELL CALCIUM W/D) 500-5 MG-MCG TABS Take 1 tablet by mouth every morning.     calcium-vitamin D (OSCAL WITH D) 500-200 MG-UNIT tablet Calcium 500 + D (D3)  1 po qd otc     escitalopram (LEXAPRO) 5 MG tablet TAKE ONE TABLET BY MOUTH ONCE DAILY 30 tablet 3   hydroxychloroquine (PLAQUENIL) 200 MG tablet Take 200 mg by mouth 2 (two) times daily.     rosuvastatin (CRESTOR) 5 MG tablet TAKE ONE TABLET BY MOUTH THREE TIMES A WEEK 39 tablet 1   No current facility-administered medications on file prior to visit.    Review of Systems     Objective:  There were no vitals filed for this visit. There were no vitals filed for this visit. There is no height or weight on file to calculate BMI.  BP Readings from Last 3 Encounters:  05/28/22 120/68  05/06/22 (!) 140/64  10/10/21 122/64    Wt Readings from Last 3 Encounters:  05/28/22 174 lb (78.9 kg)  05/06/22 170 lb (77.1 kg)  10/10/21 167 lb 8 oz (76 kg)       Physical Exam Constitutional: She appears well-developed and well-nourished. No distress.  HENT:  Head: Normocephalic and atraumatic.  Right Ear: External ear normal. Normal ear canal and TM Left Ear: External ear normal.  Normal ear canal and TM Mouth/Throat: Oropharynx is clear and moist.  Eyes: Conjunctivae normal.  Neck: Neck supple. No tracheal deviation present. No thyromegaly present.  No carotid bruit  Cardiovascular: Normal rate,  regular rhythm and normal heart sounds.   No murmur heard.  No edema. Pulmonary/Chest: Effort normal and breath sounds normal. No respiratory distress. She has no wheezes. She has no rales.  Breast: deferred   Abdominal: Soft. She exhibits no distension. There is no tenderness.  Lymphadenopathy: She has no cervical adenopathy.  Skin: Skin is warm and dry. She is not diaphoretic.  Psychiatric: She has a normal mood and affect. Her behavior is normal.     Lab Results  Component Value Date   WBC 4.1 05/28/2022   HGB 12.3 05/28/2022   HCT 36.7 05/28/2022   PLT 294.0 05/28/2022   GLUCOSE 87 05/28/2022   CHOL 217 (H) 05/28/2022   TRIG 64.0 05/28/2022   HDL 97.10 05/28/2022   LDLDIRECT 149.6 03/19/2012   LDLCALC 108 (H) 05/28/2022   ALT 17 05/28/2022   AST 18 05/28/2022   NA 141 05/28/2022   K 3.7 05/28/2022   CL 104 05/28/2022   CREATININE 0.57 05/28/2022   BUN 16 05/28/2022   CO2 31 05/28/2022   TSH 5.39 05/28/2022   HGBA1C 5.6 07/16/2021         Assessment & Plan:   Physical exam: Screening blood work  ordered Exercise   Weight   Substance abuse  none   Reviewed recommended immunizations.   Health Maintenance  Topic Date Due  DTaP/Tdap/Td (2 - Tdap) 10/17/2019   Medicare Annual Wellness (AWV)  11/10/2020   COVID-19 Vaccine (5 - 2023-24 season) 09/21/2021   INFLUENZA VACCINE  08/22/2022   DEXA SCAN  02/07/2023   Colonoscopy  09/15/2023   Pneumonia Vaccine 76+ Years old  Completed   Zoster Vaccines- Shingrix  Completed   HPV VACCINES  Aged Out   Hepatitis C Screening  Discontinued          See Problem List for Assessment and Plan of chronic medical problems.

## 2022-07-07 NOTE — Patient Instructions (Addendum)
Medications changes include :   none    A referral was ordered urogynecology and someone will call you to schedule an appointment.     Return in about 6 months (around 01/07/2023) for follow up.   Health Maintenance, Female Adopting a healthy lifestyle and getting preventive care are important in promoting health and wellness. Ask your health care provider about: The right schedule for you to have regular tests and exams. Things you can do on your own to prevent diseases and keep yourself healthy. What should I know about diet, weight, and exercise? Eat a healthy diet  Eat a diet that includes plenty of vegetables, fruits, low-fat dairy products, and lean protein. Do not eat a lot of foods that are high in solid fats, added sugars, or sodium. Maintain a healthy weight Body mass index (BMI) is used to identify weight problems. It estimates body fat based on height and weight. Your health care provider can help determine your BMI and help you achieve or maintain a healthy weight. Get regular exercise Get regular exercise. This is one of the most important things you can do for your health. Most adults should: Exercise for at least 150 minutes each week. The exercise should increase your heart rate and make you sweat (moderate-intensity exercise). Do strengthening exercises at least twice a week. This is in addition to the moderate-intensity exercise. Spend less time sitting. Even light physical activity can be beneficial. Watch cholesterol and blood lipids Have your blood tested for lipids and cholesterol at 80 years of age, then have this test every 5 years. Have your cholesterol levels checked more often if: Your lipid or cholesterol levels are high. You are older than 80 years of age. You are at high risk for heart disease. What should I know about cancer screening? Depending on your health history and family history, you may need to have cancer screening at various  ages. This may include screening for: Breast cancer. Cervical cancer. Colorectal cancer. Skin cancer. Lung cancer. What should I know about heart disease, diabetes, and high blood pressure? Blood pressure and heart disease High blood pressure causes heart disease and increases the risk of stroke. This is more likely to develop in people who have high blood pressure readings or are overweight. Have your blood pressure checked: Every 3-5 years if you are 16-105 years of age. Every year if you are 18 years old or older. Diabetes Have regular diabetes screenings. This checks your fasting blood sugar level. Have the screening done: Once every three years after age 34 if you are at a normal weight and have a low risk for diabetes. More often and at a younger age if you are overweight or have a high risk for diabetes. What should I know about preventing infection? Hepatitis B If you have a higher risk for hepatitis B, you should be screened for this virus. Talk with your health care provider to find out if you are at risk for hepatitis B infection. Hepatitis C Testing is recommended for: Everyone born from [birth year] through 1963. Anyone with known risk factors for hepatitis C. Sexually transmitted infections (STIs) Get screened for STIs, including gonorrhea and chlamydia, if: You are sexually active and are younger than 80 years of age. You are older than 80 years of age and your health care provider tells you that you are at risk for this type of infection. Your sexual activity has changed since you were last screened, and you  are at increased risk for chlamydia or gonorrhea. Ask your health care provider if you are at risk. Ask your health care provider about whether you are at high risk for HIV. Your health care provider may recommend a prescription medicine to help prevent HIV infection. If you choose to take medicine to prevent HIV, you should first get tested for HIV. You should then be tested  every 3 months for as long as you are taking the medicine. Pregnancy If you are about to stop having your period (premenopausal) and you may become pregnant, seek counseling before you get pregnant. Take 400 to 800 micrograms (mcg) of folic acid every day if you become pregnant. Ask for birth control (contraception) if you want to prevent pregnancy. Osteoporosis and menopause Osteoporosis is a disease in which the bones lose minerals and strength with aging. This can result in bone fractures. If you are 46 years old or older, or if you are at risk for osteoporosis and fractures, ask your health care provider if you should: Be screened for bone loss. Take a calcium or vitamin D supplement to lower your risk of fractures. Be given hormone replacement therapy (HRT) to treat symptoms of menopause. Follow these instructions at home: Alcohol use Do not drink alcohol if: Your health care provider tells you not to drink. You are pregnant, may be pregnant, or are planning to become pregnant. If you drink alcohol: Limit how much you have to: 0-1 drink a day. Know how much alcohol is in your drink. In the [place] one drink equals one 12 oz bottle of beer (355 mL), one 5 oz glass of wine (148 mL), or one 1 oz glass of hard liquor (44 mL). Lifestyle Do not use any products that contain nicotine or tobacco. These products include cigarettes, chewing tobacco, and vaping devices, such as e-cigarettes. If you need help quitting, ask your health care provider. Do not use street drugs. Do not share needles. Ask your health care provider for help if you need support or information about quitting drugs. General instructions Schedule regular health, dental, and eye exams. Stay current with your vaccines. Tell your health care provider if: You often feel depressed. You have ever been abused or do not feel safe at home. Summary Adopting a healthy lifestyle and getting preventive care are important in promoting  health and wellness. Follow your health care provider's instructions about healthy diet, exercising, and getting tested or screened for diseases. Follow your health care provider's instructions on monitoring your cholesterol and blood pressure. This information is not intended to replace advice given to you by your health care provider. Make sure you discuss any questions you have with your health care provider. Document Revised: 05/29/2020 Document Reviewed: 05/29/2020 Elsevier Patient Education  2024 [employer].

## 2022-07-08 ENCOUNTER — Ambulatory Visit (INDEPENDENT_AMBULATORY_CARE_PROVIDER_SITE_OTHER): Payer: Medicare PPO | Admitting: Internal Medicine

## 2022-07-08 VITALS — BP 98/68 | HR 68 | Temp 98.4°F | Ht 67.0 in | Wt 173.4 lb

## 2022-07-08 DIAGNOSIS — I10 Essential (primary) hypertension: Secondary | ICD-10-CM

## 2022-07-08 DIAGNOSIS — M1990 Unspecified osteoarthritis, unspecified site: Secondary | ICD-10-CM

## 2022-07-08 DIAGNOSIS — E038 Other specified hypothyroidism: Secondary | ICD-10-CM

## 2022-07-08 DIAGNOSIS — R32 Unspecified urinary incontinence: Secondary | ICD-10-CM | POA: Diagnosis not present

## 2022-07-08 DIAGNOSIS — E785 Hyperlipidemia, unspecified: Secondary | ICD-10-CM | POA: Diagnosis not present

## 2022-07-08 DIAGNOSIS — Z0000 Encounter for general adult medical examination without abnormal findings: Secondary | ICD-10-CM

## 2022-07-08 DIAGNOSIS — F419 Anxiety disorder, unspecified: Secondary | ICD-10-CM | POA: Diagnosis not present

## 2022-07-08 DIAGNOSIS — E559 Vitamin D deficiency, unspecified: Secondary | ICD-10-CM

## 2022-07-08 NOTE — Assessment & Plan Note (Addendum)
Chronic BP well controlled at home - a little low here today  - feels fine - typically higher  - will continue current medication and have her monitor BP at home Continue amlodipine 5 mg daily Recent CMP normal

## 2022-07-08 NOTE — Assessment & Plan Note (Signed)
Chronic Regular exercise and healthy diet encouraged Lipids controlled Continue Crestor 5 mg 3 times a week

## 2022-07-08 NOTE — Assessment & Plan Note (Signed)
Chronic Continue vitamin D supplementation 

## 2022-07-08 NOTE — Assessment & Plan Note (Signed)
Chronic Following with rheumatology Taking Plaquenil

## 2022-07-08 NOTE — Assessment & Plan Note (Signed)
Chronic Most recent TSH within normal range Will monitor

## 2022-07-08 NOTE — Assessment & Plan Note (Addendum)
Chronic Controlled, Stable Continue Lexapro 5 mg daily Not sure if she really needs this or not, but is afraid to stop it May try to take every other day for a while and see how she does

## 2022-07-10 ENCOUNTER — Encounter: Payer: Medicare PPO | Admitting: Rehabilitative and Restorative Service Providers"

## 2022-07-17 ENCOUNTER — Ambulatory Visit: Payer: Medicare PPO | Admitting: Rehabilitative and Restorative Service Providers"

## 2022-07-28 ENCOUNTER — Other Ambulatory Visit: Payer: Self-pay | Admitting: Internal Medicine

## 2022-07-29 DIAGNOSIS — E559 Vitamin D deficiency, unspecified: Secondary | ICD-10-CM | POA: Diagnosis not present

## 2022-07-29 DIAGNOSIS — K5730 Diverticulosis of large intestine without perforation or abscess without bleeding: Secondary | ICD-10-CM | POA: Diagnosis not present

## 2022-07-29 DIAGNOSIS — M25562 Pain in left knee: Secondary | ICD-10-CM | POA: Diagnosis not present

## 2022-07-29 DIAGNOSIS — I10 Essential (primary) hypertension: Secondary | ICD-10-CM | POA: Diagnosis not present

## 2022-07-29 DIAGNOSIS — Z833 Family history of diabetes mellitus: Secondary | ICD-10-CM | POA: Diagnosis not present

## 2022-07-29 DIAGNOSIS — R21 Rash and other nonspecific skin eruption: Secondary | ICD-10-CM | POA: Diagnosis not present

## 2022-07-29 DIAGNOSIS — R768 Other specified abnormal immunological findings in serum: Secondary | ICD-10-CM | POA: Diagnosis not present

## 2022-07-29 DIAGNOSIS — M11231 Other chondrocalcinosis, right wrist: Secondary | ICD-10-CM | POA: Diagnosis not present

## 2022-07-29 DIAGNOSIS — M159 Polyosteoarthritis, unspecified: Secondary | ICD-10-CM | POA: Diagnosis not present

## 2022-08-19 ENCOUNTER — Encounter (HOSPITAL_BASED_OUTPATIENT_CLINIC_OR_DEPARTMENT_OTHER): Payer: Self-pay | Admitting: Cardiology

## 2022-08-19 ENCOUNTER — Ambulatory Visit (HOSPITAL_BASED_OUTPATIENT_CLINIC_OR_DEPARTMENT_OTHER): Payer: Medicare PPO | Admitting: Cardiology

## 2022-08-19 VITALS — BP 128/66 | HR 54 | Ht 67.0 in | Wt 173.9 lb

## 2022-08-19 DIAGNOSIS — R0609 Other forms of dyspnea: Secondary | ICD-10-CM | POA: Diagnosis not present

## 2022-08-19 DIAGNOSIS — R079 Chest pain, unspecified: Secondary | ICD-10-CM

## 2022-08-19 DIAGNOSIS — I10 Essential (primary) hypertension: Secondary | ICD-10-CM | POA: Diagnosis not present

## 2022-08-19 DIAGNOSIS — E785 Hyperlipidemia, unspecified: Secondary | ICD-10-CM

## 2022-08-19 NOTE — Progress Notes (Signed)
Cardiology Office Note:  .    Date:  08/19/2022  ID:  SOO BOIVIN, DOB 04/12/1940, MRN [id number] PCP: Pincus Sanes, MD  [facility] Providers Cardiologist:  Jodelle Red, MD     History of Present Illness: .    [name] is a 82 y.o. female with a hx of hypertension, subclinical hypothyroidism, hyperlipidemia, rheumatoid arthritis, anxiety, here for the evaluation of DOE.  She saw her PCP Dr. Lawerance Bach 05/28/2022 and complained of shortness of breath with minimal exertion and fatigue. There was concern for worsening subclinical hypothyroidism. TSH 5.39 as of 05/28/22. ESR showed elevated sedimentation rate of 44. CRP 3.6. CMP within normal limits. CBC with elevated relative monocytes 12.3, otherwise within normal limits. Lipid panel showed total cholesterol 217, triglycerides 64, HDL 97, LDL 108. At her follow-up visit with Dr. Lawerance Bach she was feeling much better and wondered if she had a viral infection. Home blood pressures in the 120s/70s.  Cardiovascular risk factors: Prior clinical ASCVD: None. Comorbid conditions:  Hypertension - Her blood pressure in the office is 147/87 initially, and 128/66 on manual recheck. She notes being nervous about being in a new clinic. Amlodipine 5 mg has been working well for her: systolic 130s on average at home. Hyperlipidemia - Initially began cholesterol medications about 7 years ago. Currently on rosuvastatin 5 mg daily. Metabolic syndrome/Obesity:  Current weight 173 lbs. Chronic inflammatory conditions:  Rheumatoid arthritis. Tobacco use history:  Never. Family history: None in her father and mother. Prior pertinent testing and/or incidental findings:  TSH 5.39 as of 05/28/22. ESR showed elevated sedimentation rate of 44. CRP 3.6. CMP within normal limits. CBC with elevated relative monocytes 12.3, otherwise within normal limits. Lipid panel showed total cholesterol 217, triglycerides 64, HDL 97, LDL 108.  Exercise level: Walks 2  times a day for 30 minutes, participates in water aerobic walking a few times a week. Most days she is energetic, often becomes fatigued around 3 PM. Current diet:  She believes that her symptoms in 05/2022 were caused by a COVID-19 infection. She did not complete a COVID-19 test. However, she feels it is a likely explanation for the weakness, heaviness in legs, and significant fatigue at the time. She also notes that during this time she had woken up one morning and suddenly didn't want to drink coffee. Prior to that she had 4-5 cups of coffee a day for years.  She continues to experience some shortness of breath and chest heaviness intermittently. Sometimes short of breath after walking her dog, or walking from one room to the other. Her dyspnea is not worsening and doesn't prevent her from completing activities. Occasionally has chest heaviness after coming back from walking the dog, resolves after sitting and resting for a moment. Of note, her chest heaviness and shortness of breath usually occur independently of each other.  Sometimes has rapid heart beats; not daily, over pretty quickly.   Also struggles with balance; often feels like she is going over sideways.   She has upcoming travel plans in August and September, both of which requiring international flight. Her left foot in particularly begins to feel numb with sitting for prolonged times.   She denies any peripheral edema, lightheadedness, headaches, syncope, orthopnea, or PND.  ROS:  Please see the history of present illness. ROS otherwise negative except as noted.  (+) Shortness of breath (+) Chest heaviness (+) Palpitations (+) Imbalance  Studies Reviewed: Marland Kitchen         Physical  Exam:    VS:  BP 128/66 (BP Location: Right Arm, Patient Position: Sitting, Cuff Size: Normal)   Pulse (!) 54   Ht 5\' 7"  (1.702 m)   Wt 173 lb 14.4 oz (78.9 kg)   SpO2 97%   BMI 27.24 kg/m    Wt Readings from Last 3 Encounters:  08/19/22 173 lb  14.4 oz (78.9 kg)  07/08/22 173 lb 6.4 oz (78.7 kg)  05/28/22 174 lb (78.9 kg)    GEN: Well nourished, well developed in no acute distress HEENT: Normal, moist mucous membranes NECK: No JVD CARDIAC: regular rhythm, normal S1 and S2, no rubs or gallops. No murmur. VASCULAR: Radial and DP pulses 2+ bilaterally. No carotid bruits RESPIRATORY:  Clear to auscultation without rales, wheezing or rhonchi  ABDOMEN: Soft, non-tender, non-distended MUSCULOSKELETAL:  Ambulates independently SKIN: Warm and dry, no edema NEUROLOGIC:  Alert and oriented x 3. No focal neuro deficits noted. PSYCHIATRIC:  Normal affect   ASSESSMENT AND PLAN: .    Dyspnea on exertion Chest pain -discussed possible etiologies at length. Discussed options for further testing. Spent in depth time discussing both echo and treadmill nuclear stress test -after shared decision making, she would like to proceed with echo and hold on stress test for now -reviewed red flag warning signs that need immediate medical attention  Hypertension -at goal, continue amlodipine  Hyperlipidemia -continue rosuvastatin  Dispo: Follow-up in 3 months, or sooner as needed.  I,Mathew Stumpf,acting as a Neurosurgeon for [employer], MD.,have documented all relevant documentation on the behalf of Jodelle Red, MD,as directed by  Jodelle Red, MD while in the presence of Jodelle Red, MD.  I, Jodelle Red, MD, have reviewed all documentation for this visit. The documentation on 08/19/22 for the exam, diagnosis, procedures, and orders are all accurate and complete.   Signed, Jodelle Red, MD

## 2022-08-19 NOTE — Patient Instructions (Addendum)
We discussed an echocardiogram and a stress test today. Based on our discussion, you would prefer to start with the echocardiogram and then pursue the stress test only if necessary. I have included information on these tests.  Follow up with Dr Cristal Deer 3 months

## 2022-08-21 ENCOUNTER — Encounter (INDEPENDENT_AMBULATORY_CARE_PROVIDER_SITE_OTHER): Payer: Self-pay

## 2022-08-26 ENCOUNTER — Other Ambulatory Visit: Payer: Self-pay | Admitting: Internal Medicine

## 2022-09-10 ENCOUNTER — Other Ambulatory Visit (HOSPITAL_BASED_OUTPATIENT_CLINIC_OR_DEPARTMENT_OTHER): Payer: Medicare PPO

## 2022-09-17 ENCOUNTER — Ambulatory Visit: Payer: Medicare PPO

## 2022-09-17 VITALS — Ht 67.5 in | Wt 173.0 lb

## 2022-09-17 DIAGNOSIS — Z0000 Encounter for general adult medical examination without abnormal findings: Secondary | ICD-10-CM | POA: Diagnosis not present

## 2022-09-17 NOTE — Progress Notes (Signed)
Subjective:   [name] is a 82 y.o. female who presents for Medicare Annual (Subsequent) preventive examination.  Visit Complete: Virtual  I connected with  [name] on 09/17/22 by a audio enabled telemedicine application and verified that I am speaking with the correct person using two identifiers.  Patient Location: Home  Provider Location: Home Office  I discussed the limitations of evaluation and management by telemedicine. The patient expressed understanding and agreed to proceed.  Patient Medicare AWV questionnaire was completed by the patient on 09/13/2022; I have confirmed that all information answered by patient is correct and no changes since this date.  Vital Signs: Because this visit was a virtual/telehealth visit, some criteria may be missing or patient reported. Any vitals not documented were not able to be obtained and vitals that have been documented are patient reported.    Review of Systems    Cardiac Risk Factors include: advanced age (>68men, >53 women);dyslipidemia;hypertension;Other (see comment), Risk factor comments: Osteopenia     Objective:    Today's Vitals   09/17/22 1511  Weight: 173 lb (78.5 kg)  Height: 5' 7.5" (1.715 m)   Body mass index is 26.7 kg/m.     09/17/2022    3:16 PM 06/12/2022    1:41 PM 05/06/2022   12:02 PM 02/15/2021    1:55 PM 11/11/2019   10:14 AM 12/28/2018    2:33 PM  Advanced Directives  Does Patient Have a Medical Advance Directive? Yes Yes Yes Yes Yes Yes  Type of Estate agent of [place];Living will Healthcare Power of [place];Living will Living will Healthcare Power of [place];Living will Healthcare Power of [place];Living will Healthcare Power of [place];Living will  Does patient want to make changes to medical advance directive?  No - Patient declined  No - Patient declined No - Patient declined   Copy of Healthcare Power of Attorney in Chart? No - copy requested    No - copy  requested   Would patient like information on creating a medical advance directive?      No - Guardian declined    Current Medications (verified) Outpatient Encounter Medications as of 09/17/2022  Medication Sig   amLODipine (NORVASC) 5 MG tablet TAKE ONE TABLET BY MOUTH DAILY   Calcium Carb-Cholecalciferol (OYSTER SHELL CALCIUM W/D) 500-5 MG-MCG TABS Take 1 tablet by mouth every morning.   calcium-vitamin D (OSCAL WITH D) 500-200 MG-UNIT tablet Calcium 500 + D (D3)  1 po qd otc   escitalopram (LEXAPRO) 5 MG tablet TAKE ONE TABLET BY MOUTH ONCE DAILY   hydroxychloroquine (PLAQUENIL) 200 MG tablet Take 200 mg by mouth 2 (two) times daily.   rosuvastatin (CRESTOR) 5 MG tablet TAKE ONE TABLET BY MOUTH THREE TIMES A WEEK   No facility-administered encounter medications on file as of 09/17/2022.    Allergies (verified) Patient has no known allergies.   History: Past Medical History:  Diagnosis Date   Cancer (HCC)    skin  cancer Nose   COLONIC POLYPS, HX OF 10/31/2006   DIVERTICULOSIS, COLON 10/21/2008   ELEVATED BP READING WITHOUT DX HYPERTENSION 07/12/2009   HYPERLIPIDEMIA 07/01/2006   Hypertension    PALPITATIONS, OCCASIONAL 10/21/2008   VERTIGO 07/25/2009   Past Surgical History:  Procedure Laterality Date   ABDOMINAL HYSTERECTOMY     COLONOSCOPY  02/24/2013   LAMINECTOMY     lumbar   ROTATOR CUFF REPAIR     Family History  Problem Relation Age of Onset   Diabetes Sister  Diabetes Brother    Melanoma Brother    Colon polyps Brother    Diabetes Mother    COPD Mother    Hypertension Father    Colon cancer Neg Hx    Pancreatic cancer Neg Hx    Stomach cancer Neg Hx    Esophageal cancer Neg Hx    Rectal cancer Neg Hx    Social History   Socioeconomic History   Marital status: Widowed    Spouse name: Not on file   Number of children: 2   Years of education: Not on file   Highest education level: Not on file  Occupational History   Occupation: Retired  Tobacco  Use   Smoking status: Never   Smokeless tobacco: Never  Vaping Use   Vaping status: Never Used  Substance and Sexual Activity   Alcohol use: Yes    Comment: wine occ   Drug use: No   Sexual activity: Not Currently  Other Topics Concern   Not on file  Social History Narrative   Lives alone with her dog   Social Determinants of Health   Financial Resource Strain: Low Risk  (11/11/2019)   Overall Financial Resource Strain (CARDIA)    Difficulty of Paying Living Expenses: Not hard at all  Food Insecurity: No Food Insecurity (11/11/2019)   Hunger Vital Sign    Worried About Running Out of Food in the Last Year: Never true    Ran Out of Food in the Last Year: Never true  Transportation Needs: No Transportation Needs (11/11/2019)   PRAPARE - Administrator, Civil Service (Medical): No    Lack of Transportation (Non-Medical): No  Physical Activity: Sufficiently Active (11/11/2019)   Exercise Vital Sign    Days of Exercise per Week: 7 days    Minutes of Exercise per Session: 60 min  Stress: No Stress Concern Present (11/11/2019)   [employer] of Occupational Health - Occupational Stress Questionnaire    Feeling of Stress : Not at all  Social Connections: Moderately Integrated (11/11/2019)   Social Connection and Isolation Panel [NHANES]    Frequency of Communication with Friends and Family: More than three times a week    Frequency of Social Gatherings with Friends and Family: More than three times a week    Attends Religious Services: More than 4 times per year    Active Member of [employer] or Organizations: Yes    Attends Banker Meetings: More than 4 times per year    Marital Status: Widowed    Tobacco Counseling Counseling given: Not Answered   Clinical Intake:  Pre-visit preparation completed: Yes  Pain : No/denies pain     BMI - recorded: 26.7 Nutritional Status: BMI 25 -29 Overweight Nutritional Risks: None Diabetes: No  How often  do you need to have someone help you when you read instructions, pamphlets, or other written materials from your doctor or pharmacy?: 1 - Never  Interpreter Needed?: No  Information entered by :: [name], RMA   Activities of Daily Living    09/13/2022   11:16 AM  In your present state of health, do you have any difficulty performing the following activities:  Hearing? 0  Vision? 0  Difficulty concentrating or making decisions? 0  Walking or climbing stairs? 0  Dressing or bathing? 0  Doing errands, shopping? 0  Preparing Food and eating ? N  Using the Toilet? N  In the past six months, have you accidently leaked urine? Margaret Fend  Do you have problems with loss of bowel control? N  Managing your Medications? N  Managing your Finances? N  Housekeeping or managing your Housekeeping? N    Patient Care Team: Pincus Sanes, MD as PCP - General (Internal Medicine) Jodelle Red, MD as PCP - Cardiology (Cardiology)  Indicate any recent Medical Services you may have received from other than [facility] providers in the past year (date may be approximate).     Assessment:   This is a routine wellness examination for [name].  Hearing/Vision screen Hearing Screening - Comments:: Denies hearing difficulties   Vision Screening - Comments:: Wear eyeglasses when driving.  Dietary issues and exercise activities discussed:     Goals Addressed             This Visit's Progress    DIET - INCREASE WATER INTAKE   On track    I would like to lose 20 pounds.      Depression Screen    07/08/2022    9:31 AM 10/10/2021   10:00 AM 07/05/2021   10:35 AM 11/11/2019   10:12 AM 07/20/2018    9:02 AM 03/04/2017   10:31 AM 09/29/2014    1:24 PM  PHQ 2/9 Scores  PHQ - 2 Score 0 0 0 0 1 0 0  PHQ- 9 Score   0  6      Fall Risk    09/13/2022   11:16 AM 07/08/2022    9:31 AM 10/10/2021   10:00 AM 07/05/2021   10:35 AM 11/11/2019   10:14 AM  Fall Risk   Falls in the past year? 0 0 0 0 0   Number falls in past yr:  0 0 0 0  Injury with Fall?  0 0 0 0  Risk for fall due to :  No Fall Risks No Fall Risks No Fall Risks No Fall Risks  Follow up Falls evaluation completed;Education provided Falls evaluation completed Falls evaluation completed Falls evaluation completed Falls evaluation completed    MEDICARE RISK AT HOME: Medicare Risk at Home Any stairs in or around the home?: Yes If so, are there any without handrails?: No Home free of loose throw rugs in walkways, pet beds, electrical cords, etc?: Yes Adequate lighting in your home to reduce risk of falls?: Yes Life alert?: Yes Use of a cane, walker or w/c?: No Grab bars in the bathroom?: Yes Shower chair or bench in shower?: Yes Elevated toilet seat or a handicapped toilet?: Yes  TIMED UP AND GO:  Was the test performed?  No    Cognitive Function:        09/17/2022    3:17 PM  6CIT Screen  What Year? 0 points  What month? 0 points    Immunizations Immunization History  Administered Date(s) Administered   Influenza Split 01/07/2011, 10/03/2011   Influenza Whole 11/24/2006, 10/21/2008, 11/30/2009   Influenza, High Dose Seasonal PF 10/21/2013, 01/29/2018, 12/07/2018, 10/18/2019   Influenza,inj,Quad PF,6+ Mos 11/30/2015   Influenza-Unspecified 11/15/2012, 11/29/2014, 11/05/2016, 10/31/2020   PFIZER(Purple Top)SARS-COV-2 Vaccination 02/10/2019, 03/03/2019, 09/19/2019   Pfizer Covid-19 Vaccine Bivalent Booster 47yrs & up 10/24/2020   Pneumococcal Conjugate-13 04/27/2013   Pneumococcal Polysaccharide-23 10/21/2008   Td 10/16/2009   Td (Adult), 2 Lf Tetanus Toxid, Preservative Free 10/16/2009   Zoster Recombinant(Shingrix) 05/15/2016, 08/15/2016   Zoster, Live 07/06/2007    TDAP status: Due, Education has been provided regarding the importance of this vaccine. Advised may receive this vaccine at local pharmacy or Health Dept. Aware  to provide a copy of the vaccination record if obtained from local pharmacy or  Health Dept. Verbalized acceptance and understanding.  Flu Vaccine status: Due, Education has been provided regarding the importance of this vaccine. Advised may receive this vaccine at local pharmacy or Health Dept. Aware to provide a copy of the vaccination record if obtained from local pharmacy or Health Dept. Verbalized acceptance and understanding.  Pneumococcal vaccine status: Up to date  Covid-19 vaccine status: Information provided on how to obtain vaccines.   Qualifies for Shingles Vaccine? Yes   Zostavax completed Yes   Shingrix Completed?: Yes  Screening Tests Health Maintenance  Topic Date Due   DTaP/Tdap/Td (2 - Tdap) 10/17/2019   COVID-19 Vaccine (5 - 2023-24 season) 09/21/2021   INFLUENZA VACCINE  08/22/2022   DEXA SCAN  02/07/2023   Medicare Annual Wellness (AWV)  09/17/2023   Pneumonia Vaccine 45+ Years old  Completed   Zoster Vaccines- Shingrix  Completed   HPV VACCINES  Aged Out   Colonoscopy  Discontinued   Hepatitis C Screening  Discontinued    Health Maintenance  Health Maintenance Due  Topic Date Due   DTaP/Tdap/Td (2 - Tdap) 10/17/2019   COVID-19 Vaccine (5 - 2023-24 season) 09/21/2021   INFLUENZA VACCINE  08/22/2022    Colorectal cancer screening: No longer required.   Mammogram status: Completed 09/25/2022. Repeat every year  Bone Density status: Completed 02/06/2021. Results reflect: Bone density results: OSTEOPENIA. Repeat every 2 years.  Lung Cancer Screening: (Low Dose CT Chest recommended if Age 44-80 years, 20 pack-year currently smoking OR have quit w/in 15years.) does not qualify.   Lung Cancer Screening Referral: N/A  Additional Screening:  Hepatitis C Screening: does not qualify; Completed 11/30/2015  Vision Screening: Recommended annual ophthalmology exams for early detection of glaucoma and other disorders of the eye. Is the patient up to date with their annual eye exam?  Yes  Who is the provider or what is the name of the office  in which the patient attends annual eye exams? Dr. Hazle Quant If pt is not established with a provider, would they like to be referred to a provider to establish care? No .   Dental Screening: Recommended annual dental exams for proper oral hygiene  Community Resource Referral / Chronic Care Management: CRR required this visit?  No   CCM required this visit?  No     Plan:     I have personally reviewed and noted the following in the patient's chart:   Medical and social history Use of alcohol, tobacco or illicit drugs  Current medications and supplements including opioid prescriptions. Patient is not currently taking opioid prescriptions. Functional ability and status Nutritional status Physical activity Advanced directives List of other physicians Hospitalizations, surgeries, and ER visits in previous 12 months Vitals Screenings to include cognitive, depression, and falls Referrals and appointments  In addition, I have reviewed and discussed with patient certain preventive protocols, quality metrics, and best practice recommendations. A written personalized care plan for preventive services as well as general preventive health recommendations were provided to patient.     [name], CMA   09/17/2022   After Visit Summary: (MyChart) Due to this being a telephonic visit, the after visit summary with patients personalized plan was offered to patient via MyChart   Nurse Notes: Patient is due for a Tdap vaccine and will soon get the Flu and Covid vaccines.  Patient is aware that she can get these at her pharmacy.  She  is scheduled to have her annual eye exam in September as well as her mammogram.  Patient had no other concerns to address today.

## 2022-09-17 NOTE — Patient Instructions (Signed)
Ms. [name] , Thank you for taking time to come for your Medicare Wellness Visit. I appreciate your ongoing commitment to your health goals. Please review the following plan we discussed and let me know if I can assist you in the future.   Referrals/Orders/Follow-Ups/Clinician Recommendations: You are due for a Tetanus vaccine and soon the Flu and Covid vaccines as well.  It was nice to speak with you today and keep up the work.  This is a list of the screening recommended for you and due dates:  Health Maintenance  Topic Date Due   DTaP/Tdap/Td vaccine (2 - Tdap) 10/17/2019   COVID-19 Vaccine (5 - 2023-24 season) 09/21/2021   Flu Shot  08/22/2022   DEXA scan (bone density measurement)  02/07/2023   Medicare Annual Wellness Visit  09/17/2023   Pneumonia Vaccine  Completed   Zoster (Shingles) Vaccine  Completed   HPV Vaccine  Aged Out   Colon Cancer Screening  Discontinued   Hepatitis C Screening  Discontinued    Advanced directives: (Copy Requested) Please bring a copy of your health care power of attorney and living will to the office to be added to your chart at your convenience.  Next Medicare Annual Wellness Visit scheduled for next year: Yes

## 2022-09-19 ENCOUNTER — Ambulatory Visit: Payer: Medicare PPO | Admitting: Obstetrics and Gynecology

## 2022-09-19 ENCOUNTER — Encounter: Payer: Self-pay | Admitting: Obstetrics and Gynecology

## 2022-09-19 VITALS — BP 108/67 | HR 76 | Ht 66.46 in | Wt 173.6 lb

## 2022-09-19 DIAGNOSIS — R350 Frequency of micturition: Secondary | ICD-10-CM | POA: Diagnosis not present

## 2022-09-19 DIAGNOSIS — N993 Prolapse of vaginal vault after hysterectomy: Secondary | ICD-10-CM

## 2022-09-19 DIAGNOSIS — N8110 Cystocele, unspecified: Secondary | ICD-10-CM

## 2022-09-19 DIAGNOSIS — N3281 Overactive bladder: Secondary | ICD-10-CM

## 2022-09-19 LAB — POCT URINALYSIS DIPSTICK
Bilirubin, UA: NEGATIVE
Glucose, UA: NEGATIVE
Ketones, UA: NEGATIVE
Nitrite, UA: NEGATIVE
Protein, UA: NEGATIVE
Spec Grav, UA: 1.025 (ref 1.010–1.025)
Urobilinogen, UA: 0.2 E.U./dL
pH, UA: 5.5 (ref 5.0–8.0)

## 2022-09-19 NOTE — Patient Instructions (Signed)

## 2022-09-19 NOTE — Progress Notes (Signed)
[facility] New Patient Evaluation and Consultation  Referring Provider: Pincus Sanes, MD PCP: Pincus Sanes, MD Date of Service: 09/19/2022  SUBJECTIVE Chief Complaint: New Patient (Initial Visit) [name] is a 82 y.o. female is here for urinary incontinence.)  History of Present Illness: [name] is a 82 y.o. White or Caucasian female seen in consultation at the request of Dr. Lawerance Bach for evaluation of incontinence.     Urinary Symptoms: Leaks urine with with movement to the bathroom Leaks " not often"- does not happen every day, usually if bladder is very full Pad use: none.   She is bothered by her UI symptoms.  Day time voids 4-5.  Nocturia: 3-4 times per night to void. Voiding dysfunction: she does not empty her bladder well.  does not use a catheter to empty bladder.  When urinating, she feels a weak stream and the need to urinate multiple times in a row Drinks: cup of tea in AM, water with meals per day  UTIs:  0  UTI's in the last year.   Denies history of blood in urine and kidney or bladder stones  Pelvic Organ Prolapse Symptoms:                  She Admits to a feeling of a bulge the vaginal area. It has been present for several years.  She Admits to seeing a bulge.  This bulge is bothersome.  Bowel Symptom: Bowel movements: 1 time(s) per day Stool consistency: hard Straining: no.  Splinting: no.  Incomplete evacuation: no.  She Denies accidental bowel leakage / fecal incontinence Bowel regimen: none  Sexual Function Sexually active: no.   Pelvic Pain Denies pelvic pain   Past Medical History:  Past Medical History:  Diagnosis Date   Arthritis    Cancer (HCC)    skin  cancer Nose   COLONIC POLYPS, HX OF 10/31/2006   DIVERTICULOSIS, COLON 10/21/2008   ELEVATED BP READING WITHOUT DX HYPERTENSION 07/12/2009   HYPERLIPIDEMIA 07/01/2006   Hypertension    PALPITATIONS, OCCASIONAL 10/21/2008   VERTIGO 07/25/2009      Past Surgical History:   Past Surgical History:  Procedure Laterality Date   COLONOSCOPY  02/24/2013   LAMINECTOMY     lumbar   ROTATOR CUFF REPAIR     VAGINAL HYSTERECTOMY       Past OB/GYN History: OB History  Gravida Para Term Preterm AB Living  2 2 2     2   SAB IAB Ectopic Multiple Live Births               # Outcome Date GA Lbr Len/2nd Weight Sex Type Anes PTL Lv  2 Term      Vag-Spont     1 Term      Vag-Forceps       S/p hysterectomy   Medications: She has a current medication list which includes the following prescription(s): amlodipine, oyster shell calcium w/d, calcium-vitamin d, escitalopram, hydroxychloroquine, and rosuvastatin.   Allergies: Patient has No Known Allergies.   Social History:  Social History   Tobacco Use   Smoking status: Never   Smokeless tobacco: Never  Vaping Use   Vaping status: Never Used  Substance Use Topics   Alcohol use: Yes    Comment: wine occ   Drug use: No    Relationship status: widowed She lives with her dog.   She is not employed. Regular exercise: Yes: swim class, dog walking History of  abuse: No  Family History:   Family History  Problem Relation Age of Onset   Diabetes Sister    Diabetes Brother    Melanoma Brother    Colon polyps Brother    Diabetes Mother    COPD Mother    Hypertension Father    Colon cancer Neg Hx    Pancreatic cancer Neg Hx    Stomach cancer Neg Hx    Esophageal cancer Neg Hx    Rectal cancer Neg Hx      Review of Systems: Review of Systems  Constitutional:  Positive for malaise/fatigue. Negative for fever and weight loss.  Respiratory:  Positive for shortness of breath. Negative for cough and wheezing.   Cardiovascular:  Negative for chest pain, palpitations and leg swelling.  Gastrointestinal:  Negative for abdominal pain and blood in stool.  Genitourinary:  Negative for dysuria.  Musculoskeletal:  Negative for myalgias.  Skin:  Negative for rash.  Neurological:   Negative for dizziness and headaches.  Endo/Heme/Allergies:  Bruises/bleeds easily.  Psychiatric/Behavioral:  Negative for depression. The patient is not nervous/anxious.      OBJECTIVE Physical Exam: Vitals:   09/19/22 0907  BP: 108/67  Pulse: 76  Weight: 173 lb 9.6 oz (78.7 kg)  Height: 5' 6.46" (1.688 m)    Physical Exam Constitutional:      General: She is not in acute distress. Pulmonary:     Effort: Pulmonary effort is normal.  Abdominal:     General: There is no distension.     Palpations: Abdomen is soft.     Tenderness: There is no abdominal tenderness. There is no rebound.  Musculoskeletal:        General: No swelling. Normal range of motion.  Skin:    General: Skin is warm and dry.     Findings: No rash.  Neurological:     Mental Status: She is alert and oriented to person, place, and time.  Psychiatric:        Mood and Affect: Mood normal.        Behavior: Behavior normal.      GU / Detailed Urogynecologic Evaluation:  Pelvic Exam: Normal external female genitalia; Bartholin's and Skene's glands normal in appearance; urethral meatus normal in appearance, no urethral masses or discharge.   CST: negative  s/p hysterectomy: Speculum exam reveals normal vaginal mucosa with  atrophy and normal vaginal cuff.  Adnexa no mass, fullness, tenderness.     Pelvic floor strength I/V  Pelvic floor musculature: Right levator non-tender, Right obturator non-tender, Left levator non-tender, Left obturator non-tender  POP-Q:   POP-Q  3                                            Aa   4                                           Ba  -3                                              C   5.5  Gh  4                                            Pb  6                                            tvl   -2                                            Ap  -2                                            Bp                                                  D      Rectal Exam:  Normal external rectum  Post-Void Residual (PVR) by Bladder Scan: In order to evaluate bladder emptying, we discussed obtaining a postvoid residual and she agreed to this procedure.  Procedure: The ultrasound unit was placed on the patient's abdomen in the suprapubic region after the patient had voided. A PVR of 118 ml was obtained by bladder scan.  Laboratory Results: POC urine: trace leukocytes, otherwise negative   ASSESSMENT AND PLAN Ms. Oberholtzer is a 82 y.o. with:  1. Prolapse of anterior vaginal wall   2. Vaginal vault prolapse after hysterectomy   3. Overactive bladder   4. Urinary frequency    Stage III anterior, Stage I posterior, Stage I apical prolapse - For treatment of pelvic organ prolapse, we discussed options for management including expectant management, conservative management, and surgical management, such as Kegels, a pessary, pelvic floor physical therapy, and specific surgical procedures. - She is interested in a pessary but wants to wait 6 months - we discussed that if she has worsening symptoms of incomplete emptying then she should return sooner to have pessary placed.   2. OAB - symptoms not bothersome enough to treat at this time - We discussed I would be hesitant to start a medication without a pessary as it could worsen incomplete emptying symptoms.   Return for pessary fitting when patient is ready  Marguerita Beards, MD

## 2022-10-17 ENCOUNTER — Other Ambulatory Visit (HOSPITAL_BASED_OUTPATIENT_CLINIC_OR_DEPARTMENT_OTHER): Payer: Medicare PPO

## 2022-11-04 DIAGNOSIS — H35371 Puckering of macula, right eye: Secondary | ICD-10-CM | POA: Diagnosis not present

## 2022-11-04 DIAGNOSIS — Z79899 Other long term (current) drug therapy: Secondary | ICD-10-CM | POA: Diagnosis not present

## 2022-11-04 DIAGNOSIS — H04123 Dry eye syndrome of bilateral lacrimal glands: Secondary | ICD-10-CM | POA: Diagnosis not present

## 2022-11-04 DIAGNOSIS — H2513 Age-related nuclear cataract, bilateral: Secondary | ICD-10-CM | POA: Diagnosis not present

## 2022-11-13 ENCOUNTER — Encounter: Payer: Self-pay | Admitting: Internal Medicine

## 2022-11-13 NOTE — Progress Notes (Unsigned)
    Subjective:    Patient ID: [name], female    DOB: January 08, 1941, 82 y.o.   MRN: [id number]      HPI Sheka is here for No chief complaint on file.    Recent fall, back pain -      Medications and allergies reviewed with patient and updated if appropriate.  Current Outpatient Medications on File Prior to Visit  Medication Sig Dispense Refill   amLODipine (NORVASC) 5 MG tablet TAKE ONE TABLET BY MOUTH DAILY 90 tablet 1   Calcium Carb-Cholecalciferol (OYSTER SHELL CALCIUM W/D) 500-5 MG-MCG TABS Take 1 tablet by mouth every morning.     calcium-vitamin D (OSCAL WITH D) 500-200 MG-UNIT tablet Calcium 500 + D (D3)  1 po qd otc     escitalopram (LEXAPRO) 5 MG tablet TAKE ONE TABLET BY MOUTH ONCE DAILY 30 tablet 3   hydroxychloroquine (PLAQUENIL) 200 MG tablet Take 200 mg by mouth 2 (two) times daily.     rosuvastatin (CRESTOR) 5 MG tablet TAKE ONE TABLET BY MOUTH THREE TIMES A WEEK 39 tablet 1   No current facility-administered medications on file prior to visit.    Review of Systems     Objective:  There were no vitals filed for this visit. BP Readings from Last 3 Encounters:  09/19/22 108/67  08/19/22 128/66  07/08/22 98/68   Wt Readings from Last 3 Encounters:  09/19/22 173 lb 9.6 oz (78.7 kg)  09/17/22 173 lb (78.5 kg)  08/19/22 173 lb 14.4 oz (78.9 kg)   There is no height or weight on file to calculate BMI.    Physical Exam         Assessment & Plan:    See Problem List for Assessment and Plan of chronic medical problems.

## 2022-11-14 ENCOUNTER — Ambulatory Visit: Payer: Medicare PPO

## 2022-11-14 ENCOUNTER — Ambulatory Visit: Payer: Medicare PPO | Admitting: Internal Medicine

## 2022-11-14 VITALS — BP 138/64 | HR 100 | Temp 98.0°F | Wt 174.0 lb

## 2022-11-14 DIAGNOSIS — M47816 Spondylosis without myelopathy or radiculopathy, lumbar region: Secondary | ICD-10-CM | POA: Diagnosis not present

## 2022-11-14 DIAGNOSIS — M546 Pain in thoracic spine: Secondary | ICD-10-CM

## 2022-11-14 DIAGNOSIS — M5126 Other intervertebral disc displacement, lumbar region: Secondary | ICD-10-CM | POA: Diagnosis not present

## 2022-11-14 DIAGNOSIS — Z043 Encounter for examination and observation following other accident: Secondary | ICD-10-CM | POA: Diagnosis not present

## 2022-11-14 MED ORDER — METHOCARBAMOL 500 MG PO TABS: 500.0000 mg | ORAL_TABLET | Freq: Four times a day (QID) | ORAL | 0 refills | Status: DC

## 2022-11-14 NOTE — Assessment & Plan Note (Signed)
Acute Larey Seat three days ago onto back Has paravertebral mm pain - has gotten worse Xrays today to r/o compression fx Start methocarbamol 500 mg 4 times a day Tylenol as needed Heat, stretch

## 2022-11-14 NOTE — Patient Instructions (Addendum)
      Have xrays downstairs.     Medications changes include :   methocarbamol 4 times a day - muscle relaxer       Return if symptoms worsen or fail to improve.

## 2022-11-15 ENCOUNTER — Encounter (HOSPITAL_BASED_OUTPATIENT_CLINIC_OR_DEPARTMENT_OTHER): Payer: Self-pay | Admitting: Cardiology

## 2022-11-15 ENCOUNTER — Other Ambulatory Visit: Payer: Self-pay | Admitting: Medical Genetics

## 2022-11-15 ENCOUNTER — Ambulatory Visit (HOSPITAL_BASED_OUTPATIENT_CLINIC_OR_DEPARTMENT_OTHER): Payer: Medicare PPO | Admitting: Cardiology

## 2022-11-15 VITALS — BP 126/68 | HR 61 | Ht 67.0 in | Wt 174.8 lb

## 2022-11-15 DIAGNOSIS — Z006 Encounter for examination for normal comparison and control in clinical research program: Secondary | ICD-10-CM

## 2022-11-15 DIAGNOSIS — R0609 Other forms of dyspnea: Secondary | ICD-10-CM | POA: Diagnosis not present

## 2022-11-15 DIAGNOSIS — E785 Hyperlipidemia, unspecified: Secondary | ICD-10-CM

## 2022-11-15 DIAGNOSIS — I10 Essential (primary) hypertension: Secondary | ICD-10-CM

## 2022-11-15 DIAGNOSIS — R079 Chest pain, unspecified: Secondary | ICD-10-CM | POA: Diagnosis not present

## 2022-11-15 NOTE — Progress Notes (Signed)
Cardiology Office Note:  .    Date:  11/15/2022  ID:  [name], DOB [birth year], MRN [id number] PCP: Pincus Sanes, MD  [facility] Providers Cardiologist:  Jodelle Red, MD     History of Present Illness: .    [name] is a 82 y.o. female with a hx of hypertension, subclinical hypothyroidism, hyperlipidemia, rheumatoid arthritis, anxiety, here for follow-up today. She was initially seen 08/19/2022 for the evaluation of DOE.   She saw her PCP Dr. Lawerance Bach 05/28/2022 and complained of shortness of breath with minimal exertion and fatigue. There was concern for worsening subclinical hypothyroidism. TSH 5.39 as of 05/28/22. ESR showed elevated sedimentation rate of 44. CRP 3.6. CMP within normal limits. CBC with elevated relative monocytes 12.3, otherwise within normal limits. Lipid panel showed total cholesterol 217, triglycerides 64, HDL 97, LDL 108. At her follow-up visit with Dr. Lawerance Bach she was feeling much better and wondered if she had a viral infection. Home blood pressures in the 120s/70s.   Cardiovascular risk factors: Prior clinical ASCVD: None. Comorbid conditions:  Hypertension - Her blood pressure in the office is 147/87 initially, and 128/66 on manual recheck. She notes being nervous about being in a new clinic. Amlodipine 5 mg has been working well for her: systolic 130s on average at home. Hyperlipidemia - Initially began cholesterol medications about 7 years ago. Currently on rosuvastatin 5 mg daily. Metabolic syndrome/Obesity:  Current weight 173 lbs. Chronic inflammatory conditions:  Rheumatoid arthritis. Tobacco use history:  Never. Family history: None in her father and mother. Prior pertinent testing and/or incidental findings:  TSH 5.39 as of 05/28/22. ESR showed elevated sedimentation rate of 44. CRP 3.6. CMP within normal limits. CBC with elevated relative monocytes 12.3, otherwise within normal limits. Lipid panel showed total cholesterol 217,  triglycerides 64, HDL 97, LDL 108.  Exercise level: Walks 2 times a day for 30 minutes, participates in water aerobic walking a few times a week. Most days she is energetic, often becomes fatigued around 3 PM.  She believed that her symptoms in 05/2022 were caused by a COVID-19 infection. She did not complete a COVID-19 test. However, she felt it was a likely explanation for the weakness, heaviness in legs, and significant fatigue at the time. She also noted that during that time she had woken up one morning and suddenly didn't want to drink coffee. Prior to that she had 4-5 cups of coffee a day for years.   At her initial visit, she continued to experience some intermittent shortness of breath and chest heaviness, usually occurring independently. We discussed possible etiologies at length. Discussed options for further testing. After shared decision making, we decided to proceed with echocardiogram and defer stress test for now. Her echocardiogram was not completed.  She had a mechanical fall 11/11/22 while walking the dog. She complained of back pain and was seen by her PCP 10/24 and was prescribed methocarbamol 500 mg. Spinal imaging was notable for compression deformity of T12, technically age indeterminate but concerning for acute fracture.  Today, she confirms that she fell flat on her back on Monday. She has been taking the methocarbamol and using a heating pad for treatment. In total, she has had two falls both in the setting of walking her dog. Up until her fall, she was walking for 20 minutes twice a day with no issues.   Additionally she complains of a little bit of heartburn. She has still not wanted to drink coffee since our  last visit.  She denies any palpitations, chest pain, shortness of breath, peripheral edema, lightheadedness, headaches, syncope, orthopnea, or PND.  ROS:  Please see the history of present illness. ROS otherwise negative except as noted.  (+) Back pain secondary to  recent mechanical fall (+) Mild acid reflux  Studies Reviewed: Marland Kitchen         Physical Exam:    VS:  BP 126/68   Pulse 61   Ht 5\' 7"  (1.702 m)   Wt 174 lb 12.8 oz (79.3 kg)   SpO2 98%   BMI 27.38 kg/m    Wt Readings from Last 3 Encounters:  11/15/22 174 lb 12.8 oz (79.3 kg)  11/14/22 174 lb (78.9 kg)  09/19/22 173 lb 9.6 oz (78.7 kg)    GEN: Well nourished, well developed in no acute distress HEENT: Normal, moist mucous membranes NECK: No JVD CARDIAC: regular rhythm, normal S1 and S2, no rubs or gallops. No murmur. VASCULAR: Radial and DP pulses 2+ bilaterally. No carotid bruits RESPIRATORY:  Clear to auscultation without rales, wheezing or rhonchi  ABDOMEN: Soft, non-tender, non-distended MUSCULOSKELETAL:  Ambulates independently SKIN: Warm and dry, no edema NEUROLOGIC:  Alert and oriented x 3. No focal neuro deficits noted. PSYCHIATRIC:  Normal affect   ASSESSMENT AND PLAN: .    Dyspnea on exertion Chest pain -resolved, has not recurred. She feels that it may have been related to covid -reviewed red flag warning signs that need immediate medical attention   Hypertension -at goal, continue amlodipine   Hyperlipidemia -continue rosuvastatin   Dispo: Follow-up with me as needed.  I,Mathew Stumpf,acting as a Neurosurgeon for [employer], MD.,have documented all relevant documentation on the behalf of Jodelle Red, MD,as directed by  Jodelle Red, MD while in the presence of Jodelle Red, MD.  I, Jodelle Red, MD, have reviewed all documentation for this visit. The documentation on 11/15/22 for the exam, diagnosis, procedures, and orders are all accurate and complete.   Signed, Jodelle Red, MD

## 2022-11-15 NOTE — Patient Instructions (Signed)
Medication Instructions:  ?Your physician recommends that you continue on your current medications as directed. Please refer to the Current Medication list given to you today.  ? ?Labwork: ?NONE ? ?Testing/Procedures: ?NONE ? ?Follow-Up: ?AS NEEDED  ? ?  ?

## 2022-11-18 ENCOUNTER — Other Ambulatory Visit: Payer: Self-pay | Admitting: Internal Medicine

## 2022-11-18 DIAGNOSIS — S22080A Wedge compression fracture of T11-T12 vertebra, initial encounter for closed fracture: Secondary | ICD-10-CM

## 2022-11-21 DIAGNOSIS — Z6826 Body mass index (BMI) 26.0-26.9, adult: Secondary | ICD-10-CM | POA: Diagnosis not present

## 2022-11-21 DIAGNOSIS — S22080A Wedge compression fracture of T11-T12 vertebra, initial encounter for closed fracture: Secondary | ICD-10-CM | POA: Diagnosis not present

## 2022-12-10 ENCOUNTER — Other Ambulatory Visit (HOSPITAL_COMMUNITY): Payer: Medicare PPO

## 2022-12-10 ENCOUNTER — Other Ambulatory Visit: Payer: Self-pay | Admitting: Internal Medicine

## 2022-12-12 DIAGNOSIS — S22080A Wedge compression fracture of T11-T12 vertebra, initial encounter for closed fracture: Secondary | ICD-10-CM | POA: Diagnosis not present

## 2022-12-12 DIAGNOSIS — Z6826 Body mass index (BMI) 26.0-26.9, adult: Secondary | ICD-10-CM | POA: Diagnosis not present

## 2023-01-09 DIAGNOSIS — S22080A Wedge compression fracture of T11-T12 vertebra, initial encounter for closed fracture: Secondary | ICD-10-CM | POA: Diagnosis not present

## 2023-01-10 ENCOUNTER — Other Ambulatory Visit: Payer: Self-pay | Admitting: Internal Medicine

## 2023-01-14 DIAGNOSIS — M4854XA Collapsed vertebra, not elsewhere classified, thoracic region, initial encounter for fracture: Secondary | ICD-10-CM | POA: Diagnosis not present

## 2023-01-29 DIAGNOSIS — M154 Erosive (osteo)arthritis: Secondary | ICD-10-CM | POA: Diagnosis not present

## 2023-01-29 DIAGNOSIS — R5382 Chronic fatigue, unspecified: Secondary | ICD-10-CM | POA: Diagnosis not present

## 2023-01-29 DIAGNOSIS — G8929 Other chronic pain: Secondary | ICD-10-CM | POA: Diagnosis not present

## 2023-01-29 DIAGNOSIS — R768 Other specified abnormal immunological findings in serum: Secondary | ICD-10-CM | POA: Diagnosis not present

## 2023-01-29 DIAGNOSIS — M25541 Pain in joints of right hand: Secondary | ICD-10-CM | POA: Diagnosis not present

## 2023-01-29 DIAGNOSIS — M25542 Pain in joints of left hand: Secondary | ICD-10-CM | POA: Diagnosis not present

## 2023-01-29 DIAGNOSIS — M150 Primary generalized (osteo)arthritis: Secondary | ICD-10-CM | POA: Diagnosis not present

## 2023-01-29 DIAGNOSIS — M8588 Other specified disorders of bone density and structure, other site: Secondary | ICD-10-CM | POA: Diagnosis not present

## 2023-01-29 DIAGNOSIS — M25562 Pain in left knee: Secondary | ICD-10-CM | POA: Diagnosis not present

## 2023-02-03 ENCOUNTER — Other Ambulatory Visit (HOSPITAL_BASED_OUTPATIENT_CLINIC_OR_DEPARTMENT_OTHER): Payer: Self-pay

## 2023-02-03 ENCOUNTER — Ambulatory Visit (INDEPENDENT_AMBULATORY_CARE_PROVIDER_SITE_OTHER): Payer: Medicare PPO | Admitting: Podiatry

## 2023-02-03 DIAGNOSIS — Z5321 Procedure and treatment not carried out due to patient leaving prior to being seen by health care provider: Secondary | ICD-10-CM

## 2023-02-03 MED ORDER — BOOSTRIX 5-2.5-18.5 LF-MCG/0.5 IM SUSY
PREFILLED_SYRINGE | INTRAMUSCULAR | 0 refills | Status: DC
  Filled 2023-02-03: qty 0.5, 1d supply, fill #0

## 2023-02-03 NOTE — Progress Notes (Signed)
 Patient left without being seen.

## 2023-02-14 DIAGNOSIS — S22080A Wedge compression fracture of T11-T12 vertebra, initial encounter for closed fracture: Secondary | ICD-10-CM | POA: Diagnosis not present

## 2023-02-18 ENCOUNTER — Ambulatory Visit (HOSPITAL_BASED_OUTPATIENT_CLINIC_OR_DEPARTMENT_OTHER): Payer: Medicare PPO

## 2023-02-18 ENCOUNTER — Other Ambulatory Visit (HOSPITAL_BASED_OUTPATIENT_CLINIC_OR_DEPARTMENT_OTHER): Payer: Self-pay

## 2023-02-18 DIAGNOSIS — R0609 Other forms of dyspnea: Secondary | ICD-10-CM

## 2023-02-18 DIAGNOSIS — R079 Chest pain, unspecified: Secondary | ICD-10-CM

## 2023-02-18 LAB — ECHOCARDIOGRAM COMPLETE
Area-P 1/2: 3.12 cm2
S' Lateral: 2.74 cm

## 2023-02-20 ENCOUNTER — Encounter (HOSPITAL_BASED_OUTPATIENT_CLINIC_OR_DEPARTMENT_OTHER): Payer: Self-pay

## 2023-02-20 NOTE — Telephone Encounter (Signed)
Called and spoke to pt; discussed results. She verbalized understanding and was thankful for call.

## 2023-02-20 NOTE — Telephone Encounter (Signed)
-----   Message from Alver Sorrow sent at 02/20/2023  4:33 PM EST ----- Echocardiogram with normal heart pumping function. Heart muscle mildly stiff which is expected with age.  No significant valvular abnormalities. Good result! May follow up with cardiology on an as-needed basis.

## 2023-03-10 ENCOUNTER — Other Ambulatory Visit (HOSPITAL_BASED_OUTPATIENT_CLINIC_OR_DEPARTMENT_OTHER): Payer: Self-pay

## 2023-03-10 MED ORDER — COVID-19 MRNA VAC-TRIS(PFIZER) 30 MCG/0.3ML IM SUSY
0.3000 mL | PREFILLED_SYRINGE | Freq: Once | INTRAMUSCULAR | 0 refills | Status: AC
  Filled 2023-03-10: qty 0.3, 1d supply, fill #0

## 2023-03-10 MED ORDER — INFLUENZA VAC A&B SURF ANT ADJ 0.5 ML IM SUSY
0.5000 mL | PREFILLED_SYRINGE | Freq: Once | INTRAMUSCULAR | 0 refills | Status: AC
  Filled 2023-03-10: qty 0.5, 1d supply, fill #0

## 2023-03-24 ENCOUNTER — Ambulatory Visit (HOSPITAL_BASED_OUTPATIENT_CLINIC_OR_DEPARTMENT_OTHER): Payer: Medicare PPO | Attending: Neurosurgery | Admitting: Physical Therapy

## 2023-03-24 ENCOUNTER — Encounter (HOSPITAL_BASED_OUTPATIENT_CLINIC_OR_DEPARTMENT_OTHER): Payer: Self-pay | Admitting: Physical Therapy

## 2023-03-24 ENCOUNTER — Other Ambulatory Visit: Payer: Self-pay

## 2023-03-24 DIAGNOSIS — R293 Abnormal posture: Secondary | ICD-10-CM | POA: Diagnosis not present

## 2023-03-24 DIAGNOSIS — X58XXXA Exposure to other specified factors, initial encounter: Secondary | ICD-10-CM | POA: Diagnosis not present

## 2023-03-24 DIAGNOSIS — S22080A Wedge compression fracture of T11-T12 vertebra, initial encounter for closed fracture: Secondary | ICD-10-CM | POA: Diagnosis not present

## 2023-03-24 DIAGNOSIS — M5459 Other low back pain: Secondary | ICD-10-CM

## 2023-03-24 DIAGNOSIS — M546 Pain in thoracic spine: Secondary | ICD-10-CM

## 2023-03-24 DIAGNOSIS — R269 Unspecified abnormalities of gait and mobility: Secondary | ICD-10-CM | POA: Diagnosis not present

## 2023-03-24 DIAGNOSIS — R2689 Other abnormalities of gait and mobility: Secondary | ICD-10-CM

## 2023-03-24 NOTE — Therapy (Unsigned)
 OUTPATIENT PHYSICAL THERAPY THORACOLUMBAR EVALUATION   Patient Name: [name] MRN: [id number] DOB:04-22-1940, 83 y.o., female Today's Date: 03/25/2023  END OF SESSION:  PT End of Session - 03/24/23 0908     Visit Number 1    Number of Visits 12    Date for PT Re-Evaluation 05/19/23    PT Start Time 0845    PT Stop Time 0928    PT Time Calculation (min) 43 min    Activity Tolerance Patient tolerated treatment well    Behavior During Therapy [facility] for tasks assessed/performed             Past Medical History:  Diagnosis Date   Arthritis    Cancer (HCC)    skin  cancer Nose   COLONIC POLYPS, HX OF 10/31/2006   DIVERTICULOSIS, COLON 10/21/2008   ELEVATED BP READING WITHOUT DX HYPERTENSION 07/12/2009   HYPERLIPIDEMIA 07/01/2006   Hypertension    PALPITATIONS, OCCASIONAL 10/21/2008   VERTIGO 07/25/2009   Past Surgical History:  Procedure Laterality Date   COLONOSCOPY  02/24/2013   LAMINECTOMY     lumbar   ROTATOR CUFF REPAIR     VAGINAL HYSTERECTOMY     Patient Active Problem List   Diagnosis Date Noted   Acute bilateral thoracic back pain 11/14/2022   Muscle fatigue 05/28/2022   Body aches 09/13/2021   Fatigue 09/13/2021   Acute right-sided low back pain without sciatica 09/13/2021   Nausea 09/13/2021   DOE (dyspnea on exertion) 09/13/2021   Acute cough 09/13/2021   Abdominal bloating 09/13/2021   Excessive cerumen in both ear canals 07/05/2021   Subclinical hypothyroidism 07/04/2021   Rash and nonspecific skin eruption 07/21/2019   Pain in left knee 03/25/2019   Osteopenia 01/16/2019   Anxiety 12/01/2018   Hypertension 07/23/2018   Family history of diabetes mellitus (DM) 07/20/2018   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   Personal history of other malignant neoplasm of skin 03/05/2017   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   Vitamin D deficiency 02/06/2010   VERTIGO 07/25/2009    Diverticulosis of colon 10/21/2008   PALPITATIONS, OCCASIONAL 10/21/2008   History of colonic polyps 10/31/2006   Dyslipidemia 07/01/2006    PCP: Jake Church MD   REFERRING PROVIDER: Julio Sicks MD   REFERRING DIAG:  Diagnosis  S22.080A (ICD-10-CM) - Wedge compression fracture of T11-T12 vertebra, initial encounter for closed fracture    Rationale for Evaluation and Treatment: Rehabilitation  THERAPY DIAG:  Other low back pain  Other abnormalities of gait and mobility  Pain in thoracic spine  Abnormal posture  ONSET DATE: Larey Seat in October of 2024   SUBJECTIVE:  SUBJECTIVE STATEMENT: Patient fell in October of 2024. She suffered a t-12 wedge fx. She has been to the MD who reports her fx is healing well. 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  Continues to have significant pain with any prolonged positioning.  She is having difficulty with sit to stand transfers.  She feels stiff in the morning.  She continues to have pain when she extends to neutral.  PERTINENT HISTORY:  Arthritis (hands) potentially in the neck, cancer, diverticulosis, vertigo, potentially has RA.   PAIN:  Are you having pain? Yes: NPRS scale: 5/10 Pain location: Lower thoracic/ upper lumbar Pain description: Aching Aggravating factors: Any prolonged positioning Relieving factors: Changing position  PRECAUTIONS: None per patient she no longer has lifting restrictions but is advised to keep reasonable  RED FLAGS: None   WEIGHT BEARING RESTRICTIONS: No  FALLS:  Has patient fallen in last 6 months? Yes. Number of falls 1 fall 2nd to her dog   LIVING ENVIRONMENT: 4 steps inside of her house. Has an upstairs as well . Has some difficulty with the right leg. 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  DIAGNOSTIC FINDINGS:  X-rays:per patient fx is healing   PATIENT SURVEYS:  Modified Oswestry 18/50   COGNITION: Overall cognitive status: Within functional limits for tasks assessed     SENSATION: WFL    POSTURE: flexed trunk   PALPATION: Significant tenderness palpation from mid thoracic into the lower lumbar spine in her paraspinals  LUMBAR ROM:   AROM eval  Flexion Limited 50% not smooth movement. Pain coming back up  Extension Pain in neutral   Right lateral flexion   Left lateral flexion   Right rotation   Left rotation    (Blank rows = not tested)  L LOWER EXTREMITY MMT:    MMT Right eval Left eval  Hip flexion 15.8 19.8  Hip extension    Hip abduction 10.7 18.2  Hip adduction    Hip internal rotation    Hip external rotation    Knee flexion    Knee extension 15.6 25.1  Ankle dorsiflexion    Ankle plantarflexion    Ankle inversion    Ankle eversion     (Blank rows = not tested)    GAIT:    TREATMENT DATE:                                                                                                                                Manual: Reviewed self soft tissue mobilization using Thera cane.  Reviewed where to purchase  a pair of Thera cane.  Patient advised not to put pressure on this day on the muscle.    Exercises - Supine Lower Trunk Rotation  - 1 x daily - 7 x weekly - 3 sets - 10 reps - Theracane Over Shoulder  - 1 x daily - 7 x weekly - 3 sets - 10 reps - Seated Bilateral Shoulder Flexion Towel Slide at Table Top  - 1 x daily - 7 x weekly - 3 sets - 10 reps   PATIENT EDUCATION:  Education  details: HEP, symptom management  Person educated: Patient Education method: Explanation, Demonstration, Tactile cues, and Verbal cues Education comprehension: verbalized understanding, returned demonstration, verbal cues required, tactile cues required, and needs further education  HOME EXERCISE PROGRAM: Access Code: [email or web address] URL: [email or web address] Date: 03/25/2023 Prepared by: Lorayne Bender  ASSESSMENT:  CLINICAL IMPRESSION: Patient is a 83 year old female status post fall leading to T12 wedge compression fracture.  Per MD she is lost 60% of her spinal segment height.  The patient significant pain with prolonged positioning.  She has difficulty with sit to stand transfers.  She is previously very active.  She previously went to water aerobics 3-4 times a week.  She presents with lower extremity weakness, decreased spinal mobility, decrease general functional mobility.  She would benefit from skilled therapy to return to active lifestyle. OBJECTIVE IMPAIRMENTS: Abnormal gait, decreased activity tolerance, difficulty walking, decreased ROM, decreased strength, increased muscle spasms, postural dysfunction, and pain.   ACTIVITY LIMITATIONS: carrying, lifting, bending, sitting, standing, sleeping, stairs, transfers, and locomotion level  PARTICIPATION LIMITATIONS: meal prep, cleaning, laundry, driving, shopping, community activity, and yard work  PERSONAL FACTORS: Age and Time since onset of injury/illness/exacerbation are also affecting patient's functional outcome.   REHAB POTENTIAL: Good  CLINICAL DECISION MAKING: Evolving/moderate complexity  EVALUATION COMPLEXITY: Moderate   GOALS: Goals reviewed with patient? Yes  SHORT TERM GOALS: Target date: 04/22/2023    Patient will stand in a neutral position without increased back pain Baseline: Goal status: INITIAL  2.  Increase gross bilateral lower extremity strength by 5 pounds Baseline:  Goal status:  INITIAL  3.  Patient will be independent with basic HEP on land and in water Baseline:  Goal status: INITIAL  4.  Patient will report a 50% reduction in tenderness to palpation in bilateral paraspinal Baseline:  Goal status: INITIAL   LONG TERM GOALS: Target date: 05/20/2023    Patient will transfer sit to stand without use of hands in order to improve general functional mobility Baseline:  Goal status: INITIAL  2.  Patient will transfer out of bed without increased pain Baseline:  Goal status: INITIAL  3.  Patient will ambulate community distances without increased pain Baseline:  Goal status: INITIAL  4.  Patient will return to water aerobics Baseline:  Goal status: INITIAL  5.  Patient will go up/down 8 steps without increased pain Baseline:  Goal status: INITIAL  PLAN:  PT FREQUENCY: 2x/week  PT DURATION: 8 weeks  PLANNED INTERVENTIONS: 97110-Therapeutic exercises, 97530- Therapeutic activity, [id number]- Neuromuscular re-education, 97535- Self Care, [id number]- Manual therapy, [id number]- Gait training, [phone]- Aquatic Therapy, [id number]- Electrical stimulation (unattended), 97035- Ultrasound, Patient/Family education, Stair training, Taping, Dry Needling, DME instructions, Cryotherapy, and Moist heat   PLAN FOR NEXT SESSION:  Begin soft tissue mobilization to paraspinals as tolerated.  Assess tolerance to HEP.  Continue to work on movements within pain-free ranges of the lumbar spine and shoulders.  Consider posterior chain exercises with weight rows and extensions if  tolerated.  ER with yellow band.  Aquatics begin postural training. Begin standing endurance traoining. Work on sit to stands. Begin stair training.    Dessie Coma, PT 03/25/2023, 1:02 PM

## 2023-03-27 ENCOUNTER — Ambulatory Visit (HOSPITAL_BASED_OUTPATIENT_CLINIC_OR_DEPARTMENT_OTHER): Admitting: Physical Therapy

## 2023-03-27 ENCOUNTER — Encounter (HOSPITAL_BASED_OUTPATIENT_CLINIC_OR_DEPARTMENT_OTHER): Payer: Self-pay | Admitting: Physical Therapy

## 2023-03-27 DIAGNOSIS — R293 Abnormal posture: Secondary | ICD-10-CM | POA: Diagnosis not present

## 2023-03-27 DIAGNOSIS — R2689 Other abnormalities of gait and mobility: Secondary | ICD-10-CM

## 2023-03-27 DIAGNOSIS — R269 Unspecified abnormalities of gait and mobility: Secondary | ICD-10-CM | POA: Diagnosis not present

## 2023-03-27 DIAGNOSIS — M5459 Other low back pain: Secondary | ICD-10-CM

## 2023-03-27 DIAGNOSIS — M546 Pain in thoracic spine: Secondary | ICD-10-CM

## 2023-03-27 DIAGNOSIS — S22080A Wedge compression fracture of T11-T12 vertebra, initial encounter for closed fracture: Secondary | ICD-10-CM | POA: Diagnosis not present

## 2023-03-27 NOTE — Therapy (Signed)
 OUTPATIENT PHYSICAL THERAPY THORACOLUMBAR EVALUATION   Patient Name: [name] MRN: [id number] DOB:1940-11-06, 83 y.o., female Today's Date: 03/27/2023  END OF SESSION:  PT End of Session - 03/27/23 0806     Visit Number 2    Number of Visits 16    Date for PT Re-Evaluation 05/20/23    PT Start Time 0802    PT Stop Time 0845    PT Time Calculation (min) 43 min    Activity Tolerance Patient tolerated treatment well    Behavior During Therapy WFL for tasks assessed/performed             Past Medical History:  Diagnosis Date   Arthritis    Cancer (HCC)    skin  cancer Nose   COLONIC POLYPS, HX OF 10/31/2006   DIVERTICULOSIS, COLON 10/21/2008   ELEVATED BP READING WITHOUT DX HYPERTENSION 07/12/2009   HYPERLIPIDEMIA 07/01/2006   Hypertension    PALPITATIONS, OCCASIONAL 10/21/2008   VERTIGO 07/25/2009   Past Surgical History:  Procedure Laterality Date   COLONOSCOPY  02/24/2013   LAMINECTOMY     lumbar   ROTATOR CUFF REPAIR     VAGINAL HYSTERECTOMY     Patient Active Problem List   Diagnosis Date Noted   Acute bilateral thoracic back pain 11/14/2022   Muscle fatigue 05/28/2022   Body aches 09/13/2021   Fatigue 09/13/2021   Acute right-sided low back pain without sciatica 09/13/2021   Nausea 09/13/2021   DOE (dyspnea on exertion) 09/13/2021   Acute cough 09/13/2021   Abdominal bloating 09/13/2021   Excessive cerumen in both ear canals 07/05/2021   Subclinical hypothyroidism 07/04/2021   Rash and nonspecific skin eruption 07/21/2019   Pain in left knee 03/25/2019   Osteopenia 01/16/2019   Anxiety 12/01/2018   Hypertension 07/23/2018   Family history of diabetes mellitus (DM) 07/20/2018   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   Personal history of other malignant neoplasm of skin 03/05/2017   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   Vitamin D deficiency 02/06/2010   VERTIGO 07/25/2009    Diverticulosis of colon 10/21/2008   PALPITATIONS, OCCASIONAL 10/21/2008   History of colonic polyps 10/31/2006   Dyslipidemia 07/01/2006    PCP: Jake Church MD   REFERRING PROVIDER: Julio Sicks MD   REFERRING DIAG:  Diagnosis  S22.080A (ICD-10-CM) - Wedge compression fracture of T11-T12 vertebra, initial encounter for closed fracture    Rationale for Evaluation and Treatment: Rehabilitation  THERAPY DIAG:  Other low back pain  Other abnormalities of gait and mobility  Pain in thoracic spine  Abnormal posture  ONSET DATE: Larey Seat in October of 2024   SUBJECTIVE:  SUBJECTIVE STATEMENT: Patient reports improved bed mobility and improved mobility to stand since the last visit. She has received her cane which has helped.   Eval: Patient fell in October of 2024. She suffered a t-12 wedge fx. She has been to the MD who reports her fx is healing well. 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  Continues to have significant pain with any prolonged positioning.  She is having difficulty with sit to stand transfers.  She feels stiff in the morning.  She continues to have pain when she extends to neutral.  PERTINENT HISTORY:  Arthritis (hands) potentially in the neck, cancer, diverticulosis, vertigo, potentially has RA.   PAIN:  Are you having pain? Yes: NPRS scale: 5/10 Pain location: Lower thoracic/ upper lumbar Pain description: Aching Aggravating factors: Any prolonged positioning Relieving factors: Changing position  PRECAUTIONS: None per patient she no longer has lifting restrictions but is advised to keep reasonable  RED FLAGS: None   WEIGHT BEARING RESTRICTIONS: No  FALLS:  Has patient fallen in last 6 months? Yes. Number of falls 1 fall 2nd to her dog   LIVING  ENVIRONMENT: 4 steps inside of her house. Has an upstairs as well . Has some difficulty with the right leg. 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  DIAGNOSTIC FINDINGS:  X-rays:per patient fx is healing   PATIENT SURVEYS:  Modified Oswestry 18/50   COGNITION: Overall cognitive status: Within functional limits for tasks assessed     SENSATION: WFL    POSTURE: flexed trunk   PALPATION: Significant tenderness palpation from mid thoracic into the lower lumbar spine in her paraspinals  LUMBAR ROM:   AROM eval  Flexion Limited 50% not smooth movement. Pain coming back up  Extension Pain in neutral   Right lateral flexion   Left lateral flexion   Right rotation   Left rotation    (Blank rows = not tested)  L LOWER EXTREMITY MMT:    MMT Right eval Left eval  Hip flexion 15.8 19.8  Hip extension    Hip abduction 10.7 18.2  Hip adduction    Hip internal rotation    Hip external rotation    Knee flexion    Knee extension 15.6 25.1  Ankle dorsiflexion    Ankle plantarflexion    Ankle inversion    Ankle eversion     (Blank rows = not tested)    GAIT:    TREATMENT DATE:   3/6 Manual: skilled palpation of trigger points. Trigger point release to upper to low thoracic paraspinals in right side lying. Review of trigger points with exercises  There-ex:  Ball roll 5x 5 sec Side to side stretch 5x 5 sec hold   There-Act  Self Care  Nuro-Re-ed  Row with TA breathing yellow 2x10  Shoulder extension with TA breathing 2x10 yellow  Eval: Manual: Reviewed self soft tissue mobilization using Thera cane.  Reviewed where to purchase a pair of Thera cane.  Patient advised not to put pressure on this day on the muscle.    Exercises - Supine Lower Trunk Rotation  - 1 x daily - 7 x weekly - 3 sets - 10 reps - Theracane Over Shoulder  - 1 x daily - 7 x weekly - 3 sets - 10 reps - Seated Bilateral Shoulder Flexion Towel Slide at Table Top  - 1 x daily - 7 x weekly - 3 sets - 10 reps   PATIENT EDUCATION:  Education details: HEP, symptom management  Person educated: Patient Education method: Explanation, Demonstration, Tactile cues, and Verbal cues Education comprehension: verbalized understanding, returned demonstration, verbal cues required, tactile cues required, and needs further education  HOME EXERCISE PROGRAM: Access Code: [email or web address] URL: [email or web address] Date: 03/25/2023 Prepared by: Lorayne Bender  ASSESSMENT:  CLINICAL IMPRESSION: The patient has made great progress since her initial visit. She has less tenderness to palpation. We initiated posterior chain strengthen. She tolerated well with minimal cuing. She was given an updated HEP. She will being in the pool on Monday.    Patient is a 83 year old female status post fall leading to T12 wedge compression fracture.  Per MD she is lost 60% of her spinal segment height.  The patient significant pain with prolonged positioning.  She has difficulty with sit to stand transfers.  She is previously very active.  She previously went to water aerobics 3-4 times a week.  She presents with lower extremity weakness, decreased spinal mobility, decrease general functional mobility.  She would benefit from skilled therapy to return to active lifestyle. OBJECTIVE IMPAIRMENTS: Abnormal gait, decreased activity tolerance, difficulty walking, decreased ROM, decreased strength, increased muscle spasms, postural dysfunction, and pain.    ACTIVITY LIMITATIONS: carrying, lifting, bending, sitting, standing, sleeping, stairs, transfers, and locomotion level  PARTICIPATION LIMITATIONS: meal prep, cleaning, laundry, driving, shopping, community activity, and yard work  PERSONAL FACTORS: Age and Time since onset of injury/illness/exacerbation are also affecting patient's functional outcome.   REHAB POTENTIAL: Good  CLINICAL DECISION MAKING: Evolving/moderate complexity  EVALUATION COMPLEXITY: Moderate   GOALS: Goals reviewed with patient? Yes  SHORT TERM GOALS: Target date: 04/22/2023    Patient will stand in a neutral position without increased back pain Baseline: Goal status: INITIAL  2.  Increase gross bilateral lower extremity strength by 5 pounds Baseline:  Goal status: INITIAL  3.  Patient will be independent with basic HEP on land and in water Baseline:  Goal status: INITIAL  4.  Patient will report a 50% reduction in tenderness to palpation in bilateral paraspinal Baseline:  Goal status: INITIAL   LONG TERM GOALS: Target date: 05/20/2023    Patient will transfer sit to stand without use of hands in order to improve general functional mobility Baseline:  Goal status: INITIAL  2.  Patient will transfer out of bed without increased pain Baseline:  Goal status: INITIAL  3.  Patient will ambulate community distances without increased pain Baseline:  Goal status: INITIAL  4.  Patient will return to water aerobics Baseline:  Goal status: INITIAL  5.  Patient will go up/down 8 steps without increased pain Baseline:  Goal status: INITIAL  PLAN:  PT FREQUENCY: 2x/week  PT DURATION: 8 weeks  PLANNED INTERVENTIONS: 97110-Therapeutic exercises, 97530- Therapeutic activity, [id number]- Neuromuscular re-education, 97535- Self Care, 16109- Manual therapy, [id number]- Gait training, [id number]- Aquatic Therapy, 97014- Electrical stimulation (unattended), [id number]- Ultrasound, Patient/Family  education, Stair  training, Taping, Dry Needling, DME instructions, Cryotherapy, and Moist heat   PLAN FOR NEXT SESSION:  Begin soft tissue mobilization to paraspinals as tolerated.  Assess tolerance to HEP.  Continue to work on movements within pain-free ranges of the lumbar spine and shoulders.  Consider posterior chain exercises with weight rows and extensions if tolerated.  ER with yellow band.  Aquatics begin postural training. Begin standing endurance traoining. Work on sit to stands. Begin stair training.    Dessie Coma, PT 03/27/2023, 8:24 AM

## 2023-03-31 ENCOUNTER — Encounter (HOSPITAL_BASED_OUTPATIENT_CLINIC_OR_DEPARTMENT_OTHER): Payer: Self-pay | Admitting: Physical Therapy

## 2023-03-31 ENCOUNTER — Ambulatory Visit (HOSPITAL_BASED_OUTPATIENT_CLINIC_OR_DEPARTMENT_OTHER): Admitting: Physical Therapy

## 2023-03-31 DIAGNOSIS — R293 Abnormal posture: Secondary | ICD-10-CM | POA: Diagnosis not present

## 2023-03-31 DIAGNOSIS — M5459 Other low back pain: Secondary | ICD-10-CM

## 2023-03-31 DIAGNOSIS — M546 Pain in thoracic spine: Secondary | ICD-10-CM

## 2023-03-31 DIAGNOSIS — R2689 Other abnormalities of gait and mobility: Secondary | ICD-10-CM

## 2023-03-31 DIAGNOSIS — R269 Unspecified abnormalities of gait and mobility: Secondary | ICD-10-CM | POA: Diagnosis not present

## 2023-03-31 DIAGNOSIS — S22080A Wedge compression fracture of T11-T12 vertebra, initial encounter for closed fracture: Secondary | ICD-10-CM | POA: Diagnosis not present

## 2023-03-31 NOTE — Therapy (Signed)
 OUTPATIENT PHYSICAL THERAPY THORACOLUMBAR TREATMENT   Patient Name: [name] MRN: [id number] DOB:[birth year], 83 y.o., female Today's Date: 03/31/2023  END OF SESSION:  PT End of Session - 03/31/23 0853     Visit Number 3    Number of Visits 16    Date for PT Re-Evaluation 05/20/23    PT Start Time 0847    PT Stop Time 0925    PT Time Calculation (min) 38 min    Activity Tolerance Patient tolerated treatment well    Behavior During Therapy [facility] for tasks assessed/performed             Past Medical History:  Diagnosis Date   Arthritis    Cancer (HCC)    skin  cancer Nose   COLONIC POLYPS, HX OF 10/31/2006   DIVERTICULOSIS, COLON 10/21/2008   ELEVATED BP READING WITHOUT DX HYPERTENSION 07/12/2009   HYPERLIPIDEMIA 07/01/2006   Hypertension    PALPITATIONS, OCCASIONAL 10/21/2008   VERTIGO 07/25/2009   Past Surgical History:  Procedure Laterality Date   COLONOSCOPY  02/24/2013   LAMINECTOMY     lumbar   ROTATOR CUFF REPAIR     VAGINAL HYSTERECTOMY     Patient Active Problem List   Diagnosis Date Noted   Acute bilateral thoracic back pain 11/14/2022   Muscle fatigue 05/28/2022   Body aches 09/13/2021   Fatigue 09/13/2021   Acute right-sided low back pain without sciatica 09/13/2021   Nausea 09/13/2021   DOE (dyspnea on exertion) 09/13/2021   Acute cough 09/13/2021   Abdominal bloating 09/13/2021   Excessive cerumen in both ear canals 07/05/2021   Subclinical hypothyroidism 07/04/2021   Rash and nonspecific skin eruption 07/21/2019   Pain in left knee 03/25/2019   Osteopenia 01/16/2019   Anxiety 12/01/2018   Hypertension 07/23/2018   Family history of diabetes mellitus (DM) 07/20/2018   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   Personal history of other malignant neoplasm of skin 03/05/2017   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   Vitamin D deficiency 02/06/2010   VERTIGO 07/25/2009    Diverticulosis of colon 10/21/2008   PALPITATIONS, OCCASIONAL 10/21/2008   History of colonic polyps 10/31/2006   Dyslipidemia 07/01/2006    PCP: Jake Church MD   REFERRING PROVIDER: Julio Sicks MD   REFERRING DIAG:  Diagnosis  S22.080A (ICD-10-CM) - Wedge compression fracture of T11-T12 vertebra, initial encounter for closed fracture    Rationale for Evaluation and Treatment: Rehabilitation  THERAPY DIAG:  Other low back pain  Other abnormalities of gait and mobility  Pain in thoracic spine  Abnormal posture  ONSET DATE: Larey Seat in October of 2024   SUBJECTIVE:  SUBJECTIVE STATEMENT: Patient reports she continues to have back pain when putting dogs leash on or when she picked up dog food at grocery store.   Eval: Patient fell in October of 2024. She suffered a t-12 wedge fx. She has been to the MD who reports her fx is healing well. 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  Continues to have significant pain with any prolonged positioning.  She is having difficulty with sit to stand transfers.  She feels stiff in the morning.  She continues to have pain when she extends to neutral.  PERTINENT HISTORY:  Arthritis (hands) potentially in the neck, cancer, diverticulosis, vertigo, potentially has RA.   PAIN:  Are you having pain? Yes: NPRS scale: 0/10 Pain location: Lower thoracic/ upper lumbar Pain description:  Aggravating factors: Any prolonged positioning Relieving factors: Changing position  PRECAUTIONS: None per patient she no longer has lifting restrictions but is advised to keep reasonable  RED FLAGS: None   WEIGHT BEARING RESTRICTIONS: No  FALLS:  Has patient fallen in last 6 months? Yes. Number of falls 1 fall 2nd to her dog   LIVING ENVIRONMENT: 4 steps  inside of her house. Has an upstairs as well . Has some difficulty with the right leg. Pain around the right trochanteric bursa   OCCUPATION: retired   Hobbies:  [employer] her dog Active in her church  Likes to Travel     PLOF: Independent  PATIENT GOALS: to have improved strangth and fnereral mobility    NEXT MD VISIT:  March 27th    OBJECTIVE:  Note: Objective measures were completed at Evaluation unless otherwise noted.  DIAGNOSTIC FINDINGS:  X-rays:per patient fx is healing   PATIENT SURVEYS:  Modified Oswestry 18/50   COGNITION: Overall cognitive status: Within functional limits for tasks assessed     SENSATION: WFL    POSTURE: flexed trunk   PALPATION: Significant tenderness palpation from mid thoracic into the lower lumbar spine in her paraspinals  LUMBAR ROM:   AROM eval  Flexion Limited 50% not smooth movement. Pain coming back up  Extension Pain in neutral   Right lateral flexion   Left lateral flexion   Right rotation   Left rotation    (Blank rows = not tested)  L LOWER EXTREMITY MMT:    MMT Right eval Left eval  Hip flexion 15.8 19.8  Hip extension    Hip abduction 10.7 18.2  Hip adduction    Hip internal rotation    Hip external rotation    Knee flexion    Knee extension 15.6 25.1  Ankle dorsiflexion    Ankle plantarflexion    Ankle inversion    Ankle eversion     (Blank rows = not tested)    GAIT:    TREATMENT DATE:  [facility] Adult PT Treatment:                                                DATE: 03/31/23 Pt seen for aquatic therapy today.  Treatment took place in water 3.5-4.75 ft in depth at the [facility] pool. Temp of water was 91.  Pt entered/exited the pool via stairs in step-to pattern with single rail.  - Intro to aquatic therapy principles - unsupported walking forward/ backward with reciprocal arm swing, multiple laps - side stepping with  arm addct/ abdct -> with rainbow hand floats -  farmer carry, walking forward/ backward with bilat rainbow -> single yellow hand float - seated on bench in water with feet on blue step: STS with forward arm reach with hands on yellow hand floats x 5 -> without hand floats x 5, cues for neutral head, hinge at hip and controlled descent - straddling yellow noodle, UE breast stroke arms: cycling, suspended jumping jack LEs, cross country ski    Adult PT Treatment:                                                DATE: 3/6 Manual: skilled palpation of trigger points. Trigger point release to upper to low thoracic paraspinals in right side lying. Review of trigger points with exercises  There-ex: Ball roll 5x 5 sec Side to side stretch 5x 5 sec hold   Neuro-Re-ed  Row with TA breathing yellow 2x10  Shoulder extension with TA breathing 2x10 yellow                                                                                                                               Eval: Manual: Reviewed self soft tissue mobilization using Thera cane.  Reviewed where to purchase a pair of Thera cane.  Patient advised not to put pressure on this day on the muscle.    Exercises - Supine Lower Trunk Rotation  - 1 x daily - 7 x weekly - 3 sets - 10 reps - Theracane Over Shoulder  - 1 x daily - 7 x weekly - 3 sets - 10 reps - Seated Bilateral Shoulder Flexion Towel Slide at Table Top  - 1 x daily - 7 x weekly - 3 sets - 10 reps   PATIENT EDUCATION:  Education details: HEP, symptom management  Person educated: Patient Education method: Explanation, Demonstration, Tactile cues, and Verbal cues Education comprehension: verbalized understanding, returned demonstration, verbal cues required, tactile cues required, and needs further education  HOME EXERCISE PROGRAM: Access Code: [email or web address] URL: [email or web address] Date: 03/25/2023 Prepared by: Lorayne Bender  ASSESSMENT:  CLINICAL IMPRESSION: Pt demonstrates safety and independence in  aquatic setting with therapist instructing from deck. Pt demonstrates confidence in setting, moving throughout all depths easily.  All exercises tolerated well, without production of symptoms.  Pt will benefit from cues on body mechanics with bending/ stooping tasks.   Goals are ongoing.     From initial evaluation:  Patient is a 83 year old female status post fall leading to T12 wedge compression fracture.  Per MD she is lost 60% of her spinal segment height.  The patient significant pain with prolonged positioning.  She has difficulty with sit to stand transfers.  She is previously very active.  She previously went to water aerobics 3-4 times a week.  She presents  with lower extremity weakness, decreased spinal mobility, decrease general functional mobility.  She would benefit from skilled therapy to return to active lifestyle. OBJECTIVE IMPAIRMENTS: Abnormal gait, decreased activity tolerance, difficulty walking, decreased ROM, decreased strength, increased muscle spasms, postural dysfunction, and pain.   ACTIVITY LIMITATIONS: carrying, lifting, bending, sitting, standing, sleeping, stairs, transfers, and locomotion level  PARTICIPATION LIMITATIONS: meal prep, cleaning, laundry, driving, shopping, community activity, and yard work  PERSONAL FACTORS: Age and Time since onset of injury/illness/exacerbation are also affecting patient's functional outcome.   REHAB POTENTIAL: Good  CLINICAL DECISION MAKING: Evolving/moderate complexity  EVALUATION COMPLEXITY: Moderate   GOALS: Goals reviewed with patient? Yes  SHORT TERM GOALS: Target date: 04/22/2023    Patient will stand in a neutral position without increased back pain Baseline: Goal status: INITIAL  2.  Increase gross bilateral lower extremity strength by 5 pounds Baseline:  Goal status: INITIAL  3.  Patient will be independent with basic HEP on land and in water Baseline:  Goal status: INITIAL  4.  Patient will report a 50%  reduction in tenderness to palpation in bilateral paraspinal Baseline:  Goal status: INITIAL   LONG TERM GOALS: Target date: 05/20/2023    Patient will transfer sit to stand without use of hands in order to improve general functional mobility Baseline:  Goal status: INITIAL  2.  Patient will transfer out of bed without increased pain Baseline:  Goal status: INITIAL  3.  Patient will ambulate community distances without increased pain Baseline:  Goal status: INITIAL  4.  Patient will return to water aerobics Baseline:  Goal status: INITIAL  5.  Patient will go up/down 8 steps without increased pain Baseline:  Goal status: INITIAL  PLAN:  PT FREQUENCY: 2x/week  PT DURATION: 8 weeks  PLANNED INTERVENTIONS: 97110-Therapeutic exercises, 97530- Therapeutic activity, [id number]- Neuromuscular re-education, 97535- Self Care, 09811- Manual therapy, [id number]- Gait training, [phone]- Aquatic Therapy, 97014- Electrical stimulation (unattended), 97035- Ultrasound, Patient/Family education, Stair training, Taping, Dry Needling, DME instructions, Cryotherapy, and Moist heat   PLAN FOR NEXT SESSION:  Begin soft tissue mobilization to paraspinals as tolerated.  Assess tolerance to HEP.  Continue to work on movements within pain-free ranges of the lumbar spine and shoulders.  Consider posterior chain exercises with weight rows and extensions if tolerated.  ER with yellow band.  Aquatics begin postural training. Begin standing endurance traoining. Work on sit to stands. Begin stair training.   Mayer Camel, PTA 03/31/23 12:53 PM [facility] [address] [place], [place], [address] Phone: [phone]   Fax:  [phone]

## 2023-04-02 ENCOUNTER — Ambulatory Visit (HOSPITAL_BASED_OUTPATIENT_CLINIC_OR_DEPARTMENT_OTHER): Admitting: Physical Therapy

## 2023-04-02 ENCOUNTER — Encounter (HOSPITAL_BASED_OUTPATIENT_CLINIC_OR_DEPARTMENT_OTHER): Payer: Self-pay | Admitting: Physical Therapy

## 2023-04-02 DIAGNOSIS — R293 Abnormal posture: Secondary | ICD-10-CM

## 2023-04-02 DIAGNOSIS — R2689 Other abnormalities of gait and mobility: Secondary | ICD-10-CM

## 2023-04-02 DIAGNOSIS — M5459 Other low back pain: Secondary | ICD-10-CM

## 2023-04-02 DIAGNOSIS — R269 Unspecified abnormalities of gait and mobility: Secondary | ICD-10-CM | POA: Diagnosis not present

## 2023-04-02 DIAGNOSIS — M546 Pain in thoracic spine: Secondary | ICD-10-CM

## 2023-04-02 DIAGNOSIS — S22080A Wedge compression fracture of T11-T12 vertebra, initial encounter for closed fracture: Secondary | ICD-10-CM | POA: Diagnosis not present

## 2023-04-02 NOTE — Therapy (Signed)
 OUTPATIENT PHYSICAL THERAPY THORACOLUMBAR TREATMENT   Patient Name: [name] MRN: [id number] DOB:[birth year], 83 y.o., female Today's Date: 04/02/2023  END OF SESSION:  PT End of Session - 04/02/23 0931     Visit Number 4    Number of Visits 16    Date for PT Re-Evaluation 05/20/23    PT Start Time 0925    PT Stop Time 1009    PT Time Calculation (min) 44 min    Behavior During Therapy [facility] for tasks assessed/performed             Past Medical History:  Diagnosis Date   Arthritis    Cancer (HCC)    skin  cancer Nose   COLONIC POLYPS, HX OF 10/31/2006   DIVERTICULOSIS, COLON 10/21/2008   ELEVATED BP READING WITHOUT DX HYPERTENSION 07/12/2009   HYPERLIPIDEMIA 07/01/2006   Hypertension    PALPITATIONS, OCCASIONAL 10/21/2008   VERTIGO 07/25/2009   Past Surgical History:  Procedure Laterality Date   COLONOSCOPY  02/24/2013   LAMINECTOMY     lumbar   ROTATOR CUFF REPAIR     VAGINAL HYSTERECTOMY     Patient Active Problem List   Diagnosis Date Noted   Acute bilateral thoracic back pain 11/14/2022   Muscle fatigue 05/28/2022   Body aches 09/13/2021   Fatigue 09/13/2021   Acute right-sided low back pain without sciatica 09/13/2021   Nausea 09/13/2021   DOE (dyspnea on exertion) 09/13/2021   Acute cough 09/13/2021   Abdominal bloating 09/13/2021   Excessive cerumen in both ear canals 07/05/2021   Subclinical hypothyroidism 07/04/2021   Rash and nonspecific skin eruption 07/21/2019   Pain in left knee 03/25/2019   Osteopenia 01/16/2019   Anxiety 12/01/2018   Hypertension 07/23/2018   Family history of diabetes mellitus (DM) 07/20/2018   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   Personal history of other malignant neoplasm of skin 03/05/2017   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   Vitamin D deficiency 02/06/2010   VERTIGO 07/25/2009   Diverticulosis of colon 10/21/2008   PALPITATIONS, OCCASIONAL  10/21/2008   History of colonic polyps 10/31/2006   Dyslipidemia 07/01/2006    PCP: Jake Church MD   REFERRING PROVIDER: Julio Sicks MD   REFERRING DIAG:  Diagnosis  S22.080A (ICD-10-CM) - Wedge compression fracture of T11-T12 vertebra, initial encounter for closed fracture    Rationale for Evaluation and Treatment: Rehabilitation  THERAPY DIAG:  Other low back pain  Other abnormalities of gait and mobility  Pain in thoracic spine  Abnormal posture  ONSET DATE: Larey Seat in October of 2024   SUBJECTIVE:  SUBJECTIVE STATEMENT: "I was sore in my hips and lower back" after last session;  "a good sore".  Pt reports she had completed HEP the afternoon of last session.    Eval: Patient fell in October of 2024. She suffered a t-12 wedge fx. She has been to the MD who reports her fx is healing well. 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  Continues to have significant pain with any prolonged positioning.  She is having difficulty with sit to stand transfers.  She feels stiff in the morning.  She continues to have pain when she extends to neutral.  PERTINENT HISTORY:  Arthritis (hands) potentially in the neck, cancer, diverticulosis, vertigo, potentially has RA.   PAIN:  Are you having pain? Yes: NPRS scale: 2/10 Pain location: Lower thoracic/ upper lumbar Pain description: sore Aggravating factors: Any prolonged positioning Relieving factors: Changing position  PRECAUTIONS: None per patient she no longer has lifting restrictions but is advised to keep reasonable  RED FLAGS: None   WEIGHT BEARING RESTRICTIONS: No  FALLS:  Has patient fallen in last 6 months? Yes. Number of falls 1 fall 2nd to her dog   LIVING ENVIRONMENT: 4 steps inside of her house. Has an upstairs as  well . Has some difficulty with the right leg. Pain around the right trochanteric bursa   OCCUPATION: retired   Hobbies:  [employer] her dog Active in her church  Likes to Travel     PLOF: Independent  PATIENT GOALS: to have improved strangth and fnereral mobility    NEXT MD VISIT:  March 27th    OBJECTIVE:  Note: Objective measures were completed at Evaluation unless otherwise noted.  DIAGNOSTIC FINDINGS:  X-rays:per patient fx is healing   PATIENT SURVEYS:  Modified Oswestry 18/50   COGNITION: Overall cognitive status: Within functional limits for tasks assessed     SENSATION: WFL    POSTURE: flexed trunk   PALPATION: Significant tenderness palpation from mid thoracic into the lower lumbar spine in her paraspinals  LUMBAR ROM:   AROM eval  Flexion Limited 50% not smooth movement. Pain coming back up  Extension Pain in neutral   Right lateral flexion   Left lateral flexion   Right rotation   Left rotation    (Blank rows = not tested)  L LOWER EXTREMITY MMT:    MMT Right eval Left eval  Hip flexion 15.8 19.8  Hip extension    Hip abduction 10.7 18.2  Hip adduction    Hip internal rotation    Hip external rotation    Knee flexion    Knee extension 15.6 25.1  Ankle dorsiflexion    Ankle plantarflexion    Ankle inversion    Ankle eversion     (Blank rows = not tested)    GAIT:    TREATMENT DATE:  [facility] Adult PT Treatment:                                                DATE: 04/02/23 Pt seen for aquatic therapy today.  Treatment took place in water 3.5-4.75 ft in depth at the [facility] pool. Temp of water was 91.  Pt entered/exited the pool via stairs in step-to pattern with single rail.  - unsupported walking forward/ backward with reciprocal arm swing, multiple laps - side stepping with  arm addct/ abdct  with rainbow hand floats, 3 laps - farmer carry, walking forward/ backward with bilat rainbow -> single  yellow hand float - seated on bench in water with feet on blue step: STS with forward arm reach x 10 -cues for neutral head, hinge at hip and controlled descent - alternating toe taps to 1st/2nd without support (easy) - forward step ups with light UE support on rail x 10 each LE - UE on wall: hip abdct/ addct 2 x 10 - return to walking forward/ backward with arm swing  - straddling yellow noodle, UE breast stroke arms: cycling - (reviewed verbally how to put dog leash on with hip hinge) - fig4 stretch at stairs x 10 sec each   [facility] Adult PT Treatment:                                                DATE: 03/31/23 Pt seen for aquatic therapy today.  Treatment took place in water 3.5-4.75 ft in depth at the [facility] pool. Temp of water was 91.  Pt entered/exited the pool via stairs in step-to pattern with single rail.  - Intro to aquatic therapy principles - unsupported walking forward/ backward with reciprocal arm swing, multiple laps - side stepping with arm addct/ abdct -> with rainbow hand floats - farmer carry, walking forward/ backward with bilat rainbow -> single yellow hand float - seated on bench in water with feet on blue step: STS with forward arm reach with hands on yellow hand floats x 5 -> without hand floats x 5, cues for neutral head, hinge at hip and controlled descent - straddling yellow noodle, UE breast stroke arms: cycling, suspended jumping jack LEs, cross country ski    Adult PT Treatment:                                                DATE: 3/6 Manual: skilled palpation of trigger points. Trigger point release to upper to low thoracic paraspinals in right side lying. Review of trigger points with exercises  There-ex: Ball roll 5x 5 sec Side to side stretch 5x 5 sec hold   Neuro-Re-ed  Row with TA breathing yellow 2x10  Shoulder extension with TA breathing 2x10 yellow                                                                                                                                Eval: Manual: Reviewed self soft tissue mobilization using Thera cane.  Reviewed where to purchase a pair of Thera cane.  Patient advised not to put pressure on this day on the muscle.    Exercises - Supine Lower  Trunk Rotation  - 1 x daily - 7 x weekly - 3 sets - 10 reps - Theracane Over Shoulder  - 1 x daily - 7 x weekly - 3 sets - 10 reps - Seated Bilateral Shoulder Flexion Towel Slide at Table Top  - 1 x daily - 7 x weekly - 3 sets - 10 reps   PATIENT EDUCATION:  Education details: HEP, symptom management  Person educated: Patient Education method: Explanation, Demonstration, Tactile cues, and Verbal cues Education comprehension: verbalized understanding, returned demonstration, verbal cues required, tactile cues required, and needs further education  HOME EXERCISE PROGRAM: Access Code: [email or web address] URL: [email or web address] Date: 03/25/2023 Prepared by: Lorayne Bender  ASSESSMENT:  CLINICAL IMPRESSION:   All exercises tolerated well, except forward step ups with RLE which increased some discomfort in R hip. Pt able to complete more exercises without difficulty and with reduced back symptoms.  Pt advised on body mechanics with bending/ stooping tasks like putting on dog leash.   Goals are ongoing.     From initial evaluation:  Patient is a 83 year old female status post fall leading to T12 wedge compression fracture.  Per MD she is lost 60% of her spinal segment height.  The patient significant pain with prolonged positioning.  She has difficulty with sit to stand transfers.  She is previously very active.  She previously went to water aerobics 3-4 times a week.  She presents with lower extremity weakness, decreased spinal mobility, decrease general functional mobility.  She would benefit from skilled therapy to return to active lifestyle. OBJECTIVE IMPAIRMENTS: Abnormal gait, decreased activity tolerance, difficulty walking,  decreased ROM, decreased strength, increased muscle spasms, postural dysfunction, and pain.   ACTIVITY LIMITATIONS: carrying, lifting, bending, sitting, standing, sleeping, stairs, transfers, and locomotion level  PARTICIPATION LIMITATIONS: meal prep, cleaning, laundry, driving, shopping, community activity, and yard work  PERSONAL FACTORS: Age and Time since onset of injury/illness/exacerbation are also affecting patient's functional outcome.   REHAB POTENTIAL: Good  CLINICAL DECISION MAKING: Evolving/moderate complexity  EVALUATION COMPLEXITY: Moderate   GOALS: Goals reviewed with patient? Yes  SHORT TERM GOALS: Target date: 04/22/2023    Patient will stand in a neutral position without increased back pain Baseline: Goal status: INITIAL  2.  Increase gross bilateral lower extremity strength by 5 pounds Baseline:  Goal status: INITIAL  3.  Patient will be independent with basic HEP on land and in water Baseline:  Goal status: INITIAL  4.  Patient will report a 50% reduction in tenderness to palpation in bilateral paraspinal Baseline:  Goal status: INITIAL   LONG TERM GOALS: Target date: 05/20/2023    Patient will transfer sit to stand without use of hands in order to improve general functional mobility Baseline:  Goal status: INITIAL  2.  Patient will transfer out of bed without increased pain Baseline:  Goal status: INITIAL  3.  Patient will ambulate community distances without increased pain Baseline:  Goal status: INITIAL  4.  Patient will return to water aerobics Baseline:  Goal status: INITIAL  5.  Patient will go up/down 8 steps without increased pain Baseline:  Goal status: INITIAL  PLAN:  PT FREQUENCY: 2x/week  PT DURATION: 8 weeks  PLANNED INTERVENTIONS: 97110-Therapeutic exercises, 97530- Therapeutic activity, [id number]- Neuromuscular re-education, 97535- Self Care, [id number]- Manual therapy, [id number]- Gait training, [phone]- Aquatic Therapy, [id number]-  Electrical stimulation (unattended), 97035- Ultrasound, Patient/Family education, Stair training, Taping, Dry Needling, DME instructions, Cryotherapy, and Moist heat   PLAN FOR NEXT SESSION:  Begin soft tissue mobilization  to paraspinals as tolerated.  Assess tolerance to HEP.  Continue to work on movements within pain-free ranges of the lumbar spine and shoulders.  Consider posterior chain exercises with weight rows and extensions if tolerated.  ER with yellow band.  Aquatics begin postural training. Begin standing endurance training. Work on sit to stands. Begin stair training.    Mayer Camel, PTA 04/02/23 10:23 AM [facility] [address] [place], [place], [address] Phone: [phone]   Fax:  [phone]

## 2023-04-06 ENCOUNTER — Other Ambulatory Visit: Payer: Self-pay | Admitting: Internal Medicine

## 2023-04-07 ENCOUNTER — Ambulatory Visit (HOSPITAL_BASED_OUTPATIENT_CLINIC_OR_DEPARTMENT_OTHER): Admitting: Physical Therapy

## 2023-04-07 DIAGNOSIS — M5459 Other low back pain: Secondary | ICD-10-CM

## 2023-04-07 DIAGNOSIS — R293 Abnormal posture: Secondary | ICD-10-CM

## 2023-04-07 DIAGNOSIS — R269 Unspecified abnormalities of gait and mobility: Secondary | ICD-10-CM | POA: Diagnosis not present

## 2023-04-07 DIAGNOSIS — M546 Pain in thoracic spine: Secondary | ICD-10-CM

## 2023-04-07 DIAGNOSIS — S22080A Wedge compression fracture of T11-T12 vertebra, initial encounter for closed fracture: Secondary | ICD-10-CM | POA: Diagnosis not present

## 2023-04-07 DIAGNOSIS — R2689 Other abnormalities of gait and mobility: Secondary | ICD-10-CM

## 2023-04-07 NOTE — Therapy (Signed)
 OUTPATIENT PHYSICAL THERAPY THORACOLUMBAR TREATMENT   Patient Name: [name] MRN: [id number] DOB:Oct 12, 1940, 83 y.o., female Today's Date: 04/07/2023  END OF SESSION:  PT End of Session - 04/07/23 0812     Visit Number 5    Number of Visits 16    Date for PT Re-Evaluation 05/20/23    PT Start Time 0802    PT Stop Time 0840    PT Time Calculation (min) 38 min    Behavior During Therapy [facility] for tasks assessed/performed             Past Medical History:  Diagnosis Date   Arthritis    Cancer (HCC)    skin  cancer Nose   COLONIC POLYPS, HX OF 10/31/2006   DIVERTICULOSIS, COLON 10/21/2008   ELEVATED BP READING WITHOUT DX HYPERTENSION 07/12/2009   HYPERLIPIDEMIA 07/01/2006   Hypertension    PALPITATIONS, OCCASIONAL 10/21/2008   VERTIGO 07/25/2009   Past Surgical History:  Procedure Laterality Date   COLONOSCOPY  02/24/2013   LAMINECTOMY     lumbar   ROTATOR CUFF REPAIR     VAGINAL HYSTERECTOMY     Patient Active Problem List   Diagnosis Date Noted   Acute bilateral thoracic back pain 11/14/2022   Muscle fatigue 05/28/2022   Body aches 09/13/2021   Fatigue 09/13/2021   Acute right-sided low back pain without sciatica 09/13/2021   Nausea 09/13/2021   DOE (dyspnea on exertion) 09/13/2021   Acute cough 09/13/2021   Abdominal bloating 09/13/2021   Excessive cerumen in both ear canals 07/05/2021   Subclinical hypothyroidism 07/04/2021   Rash and nonspecific skin eruption 07/21/2019   Pain in left knee 03/25/2019   Osteopenia 01/16/2019   Anxiety 12/01/2018   Hypertension 07/23/2018   Family history of diabetes mellitus (DM) 07/20/2018   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   Personal history of other malignant neoplasm of skin 03/05/2017   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   Vitamin D deficiency 02/06/2010   VERTIGO 07/25/2009   Diverticulosis of colon 10/21/2008   PALPITATIONS, OCCASIONAL  10/21/2008   History of colonic polyps 10/31/2006   Dyslipidemia 07/01/2006    PCP: Jake Church MD   REFERRING PROVIDER: Julio Sicks MD   REFERRING DIAG:  Diagnosis  S22.080A (ICD-10-CM) - Wedge compression fracture of T11-T12 vertebra, initial encounter for closed fracture    Rationale for Evaluation and Treatment: Rehabilitation  THERAPY DIAG:  Other low back pain  Other abnormalities of gait and mobility  Pain in thoracic spine  Abnormal posture  ONSET DATE: Larey Seat in October of 2024   SUBJECTIVE:  SUBJECTIVE STATEMENT: "This past week was the first week in a long time that I didn't have pain".  Pt reports that she traveled to [place] and sat in stadium for over 8 hrs.  Had some stiffness, soreness, " but not enough to take a pill". She reports she has been working on bending knees and hips to apply dog leash and taking clothes out of dryer.   Eval: Patient fell in October of 2024. She suffered a t-12 wedge fx. She has been to the MD who reports her fx is healing well. 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  Continues to have significant pain with any prolonged positioning.  She is having difficulty with sit to stand transfers.  She feels stiff in the morning.  She continues to have pain when she extends to neutral.  PERTINENT HISTORY:  Arthritis (hands) potentially in the neck, cancer, diverticulosis, vertigo, potentially has RA.   PAIN:  Are you having pain? no: NPRS scale: 0 Pain location: Lower thoracic/ upper lumbar Pain description:  Aggravating factors: Any prolonged positioning Relieving factors: Changing position  PRECAUTIONS: None per patient she no longer has lifting restrictions but is advised to keep reasonable  RED FLAGS: None   WEIGHT BEARING  RESTRICTIONS: No  FALLS:  Has patient fallen in last 6 months? Yes. Number of falls 1 fall 2nd to her dog   LIVING ENVIRONMENT: 4 steps inside of her house. Has an upstairs as well . Has some difficulty with the right leg. Pain around the right trochanteric bursa   OCCUPATION: retired   Hobbies:  [employer] her dog Active in her church  Likes to Travel     PLOF: Independent  PATIENT GOALS: to have improved strangth and fnereral mobility    NEXT MD VISIT:  March 27th    OBJECTIVE:  Note: Objective measures were completed at Evaluation unless otherwise noted.  DIAGNOSTIC FINDINGS:  X-rays:per patient fx is healing   PATIENT SURVEYS:  Modified Oswestry 18/50   COGNITION: Overall cognitive status: Within functional limits for tasks assessed     SENSATION: WFL    POSTURE: flexed trunk   PALPATION: Significant tenderness palpation from mid thoracic into the lower lumbar spine in her paraspinals  LUMBAR ROM:   AROM eval  Flexion Limited 50% not smooth movement. Pain coming back up  Extension Pain in neutral   Right lateral flexion   Left lateral flexion   Right rotation   Left rotation    (Blank rows = not tested)  L LOWER EXTREMITY MMT:    MMT Right eval Left eval  Hip flexion 15.8 19.8  Hip extension    Hip abduction 10.7 18.2  Hip adduction    Hip internal rotation    Hip external rotation    Knee flexion    Knee extension 15.6 25.1  Ankle dorsiflexion    Ankle plantarflexion    Ankle inversion    Ankle eversion     (Blank rows = not tested)    GAIT:    TREATMENT DATE:  [facility] Adult PT Treatment:                                                DATE: 04/07/23 Pt seen for aquatic therapy today.  Treatment took place in water 3.5-4.75 ft in depth at the  [facility] pool. Temp of water was 91.  Pt entered/exited the pool via stairs in step-to pattern with single rail.  - unsupported walking forward/ backward with  reciprocal arm swing -> with light resistance bells, multiple laps - side stepping with arm addct/ abdct  with light resistance bells, 3 laps - STS on 3rd step in water with forward arm reach x 6 -  forward runners step ups with light intermittent UE support on rail x 12 each LE - return to walking forward/ backward with arm swing  - wall push up/off x 10 with core engaged - TrA set with hollow long noodle pull down to thighs x 10 - staggered stance - kickboard row x 15 each LE forward - straddling yellow noodle, UE breast stroke arms: cycling - fig4 stretch at stairs x 10 sec each   [facility] Adult PT Treatment:                                                DATE: 04/02/23 Pt seen for aquatic therapy today.  Treatment took place in water 3.5-4.75 ft in depth at the [facility] pool. Temp of water was 91.  Pt entered/exited the pool via stairs in step-to pattern with single rail.  - unsupported walking forward/ backward with reciprocal arm swing, multiple laps - side stepping with arm addct/ abdct  with rainbow hand floats, 3 laps - farmer carry, walking forward/ backward with bilat rainbow -> single yellow hand float - seated on bench in water with feet on blue step: STS with forward arm reach x 10 -cues for neutral head, hinge at hip and controlled descent - alternating toe taps to 1st/2nd without support (easy) - forward step ups with light UE support on rail x 10 each LE - UE on wall: hip abdct/ addct 2 x 10 - return to walking forward/ backward with arm swing  - straddling yellow noodle, UE breast stroke arms: cycling - (reviewed verbally how to put dog leash on with hip hinge) - fig4 stretch at stairs x 10 sec each   [facility] Adult PT Treatment:                                                DATE: 03/31/23 Pt seen for aquatic therapy today.  Treatment took place in water 3.5-4.75 ft in depth at the [facility] pool. Temp of water was 91.  Pt entered/exited the pool via  stairs in step-to pattern with single rail.  - Intro to aquatic therapy principles - unsupported walking forward/ backward with reciprocal arm swing, multiple laps - side stepping with arm addct/ abdct -> with rainbow hand floats - farmer carry, walking forward/ backward with bilat rainbow -> single yellow hand float - seated on bench in water with feet on blue step: STS with forward arm reach with hands on yellow hand floats x 5 -> without hand floats x 5, cues for neutral head, hinge at hip and controlled descent - straddling yellow noodle, UE breast stroke arms: cycling, suspended jumping jack LEs, cross country ski    Adult PT Treatment:  DATE: 3/6 Manual: skilled palpation of trigger points. Trigger point release to upper to low thoracic paraspinals in right side lying. Review of trigger points with exercises  There-ex: Ball roll 5x 5 sec Side to side stretch 5x 5 sec hold   Neuro-Re-ed  Row with TA breathing yellow 2x10  Shoulder extension with TA breathing 2x10 yellow                                                                                                                               Eval: Manual: Reviewed self soft tissue mobilization using Thera cane.  Reviewed where to purchase a pair of Thera cane.  Patient advised not to put pressure on this day on the muscle.    Exercises - Supine Lower Trunk Rotation  - 1 x daily - 7 x weekly - 3 sets - 10 reps - Theracane Over Shoulder  - 1 x daily - 7 x weekly - 3 sets - 10 reps - Seated Bilateral Shoulder Flexion Towel Slide at Table Top  - 1 x daily - 7 x weekly - 3 sets - 10 reps   PATIENT EDUCATION:  Education details: HEP, symptom management  Person educated: Patient Education method: Explanation, Demonstration, Tactile cues, and Verbal cues Education comprehension: verbalized understanding, returned demonstration, verbal cues required, tactile cues required, and needs  further education  HOME EXERCISE PROGRAM: Access Code: [email or web address] URL: [email or web address] Date: 03/25/2023 Prepared by: Lorayne Bender  ASSESSMENT:  CLINICAL IMPRESSION:   All exercises tolerated well, even forward step ups with RLE which increased pain in R hip last session. Pt able to complete all exercises without any back symptoms.  Pt reminded of body mechanics with bending/ stooping tasks like putting on dog leash.  Pt making good progress towards goals.    From initial evaluation:  Patient is a 83 year old female status post fall leading to T12 wedge compression fracture.  Per MD she is lost 60% of her spinal segment height.  The patient significant pain with prolonged positioning.  She has difficulty with sit to stand transfers.  She is previously very active.  She previously went to water aerobics 3-4 times a week.  She presents with lower extremity weakness, decreased spinal mobility, decrease general functional mobility.  She would benefit from skilled therapy to return to active lifestyle. OBJECTIVE IMPAIRMENTS: Abnormal gait, decreased activity tolerance, difficulty walking, decreased ROM, decreased strength, increased muscle spasms, postural dysfunction, and pain.   ACTIVITY LIMITATIONS: carrying, lifting, bending, sitting, standing, sleeping, stairs, transfers, and locomotion level  PARTICIPATION LIMITATIONS: meal prep, cleaning, laundry, driving, shopping, community activity, and yard work  PERSONAL FACTORS: Age and Time since onset of injury/illness/exacerbation are also affecting patient's functional outcome.   REHAB POTENTIAL: Good  CLINICAL DECISION MAKING: Evolving/moderate complexity  EVALUATION COMPLEXITY: Moderate   GOALS: Goals reviewed with patient? Yes  SHORT TERM GOALS: Target date: 04/22/2023    Patient will stand in a neutral position without increased back  pain Baseline: Goal status: INITIAL  2.  Increase gross bilateral lower  extremity strength by 5 pounds Baseline:  Goal status: INITIAL  3.  Patient will be independent with basic HEP on land and in water Baseline:  Goal status: INITIAL  4.  Patient will report a 50% reduction in tenderness to palpation in bilateral paraspinal Baseline:  Goal status: INITIAL   LONG TERM GOALS: Target date: 05/20/2023    Patient will transfer sit to stand without use of hands in order to improve general functional mobility Baseline:  Goal status: INITIAL  2.  Patient will transfer out of bed without increased pain Baseline:  Goal status: INITIAL  3.  Patient will ambulate community distances without increased pain Baseline:  Goal status: INITIAL  4.  Patient will return to water aerobics Baseline:  Goal status: INITIAL  5.  Patient will go up/down 8 steps without increased pain Baseline:  Goal status: INITIAL  PLAN:  PT FREQUENCY: 2x/week  PT DURATION: 8 weeks  PLANNED INTERVENTIONS: 97110-Therapeutic exercises, 97530- Therapeutic activity, [id number]- Neuromuscular re-education, 97535- Self Care, [id number]- Manual therapy, [id number]- Gait training, [phone]- Aquatic Therapy, [id number]- Electrical stimulation (unattended), 97035- Ultrasound, Patient/Family education, Stair training, Taping, Dry Needling, DME instructions, Cryotherapy, and Moist heat   PLAN FOR NEXT SESSION:  Begin soft tissue mobilization to paraspinals as tolerated.  Assess tolerance to HEP.  Continue to work on movements within pain-free ranges of the lumbar spine and shoulders.  Consider posterior chain exercises with weight rows and extensions if tolerated.  ER with yellow band.  Aquatics begin postural training. Begin standing endurance training. Work on sit to stands. Begin stair training.   Mayer Camel, PTA 04/07/23 8:45 AM [facility] [address] [place], [place], [address] Phone: [phone]   Fax:  [phone]

## 2023-04-09 NOTE — Therapy (Signed)
 OUTPATIENT PHYSICAL THERAPY THORACOLUMBAR TREATMENT   Patient Name: [name] MRN: [id number] DOB:1940/12/17, 83 y.o., female Today's Date: 04/10/2023  END OF SESSION:  PT End of Session - 04/10/23 0846     Visit Number 6    Number of Visits 16    Date for PT Re-Evaluation 05/20/23    PT Start Time 0847    PT Stop Time 0929    PT Time Calculation (min) 42 min    Activity Tolerance Patient tolerated treatment well              Past Medical History:  Diagnosis Date   Arthritis    Cancer (HCC)    skin  cancer Nose   COLONIC POLYPS, HX OF 10/31/2006   DIVERTICULOSIS, COLON 10/21/2008   ELEVATED BP READING WITHOUT DX HYPERTENSION 07/12/2009   HYPERLIPIDEMIA 07/01/2006   Hypertension    PALPITATIONS, OCCASIONAL 10/21/2008   VERTIGO 07/25/2009   Past Surgical History:  Procedure Laterality Date   COLONOSCOPY  02/24/2013   LAMINECTOMY     lumbar   ROTATOR CUFF REPAIR     VAGINAL HYSTERECTOMY     Patient Active Problem List   Diagnosis Date Noted   Acute bilateral thoracic back pain 11/14/2022   Muscle fatigue 05/28/2022   Body aches 09/13/2021   Fatigue 09/13/2021   Acute right-sided low back pain without sciatica 09/13/2021   Nausea 09/13/2021   DOE (dyspnea on exertion) 09/13/2021   Acute cough 09/13/2021   Abdominal bloating 09/13/2021   Excessive cerumen in both ear canals 07/05/2021   Subclinical hypothyroidism 07/04/2021   Rash and nonspecific skin eruption 07/21/2019   Pain in left knee 03/25/2019   Osteopenia 01/16/2019   Anxiety 12/01/2018   Hypertension 07/23/2018   Family history of diabetes mellitus (DM) 07/20/2018   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   Personal history of other malignant neoplasm of skin 03/05/2017   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   Vitamin D deficiency 02/06/2010   VERTIGO 07/25/2009   Diverticulosis of colon 10/21/2008   PALPITATIONS, OCCASIONAL  10/21/2008   History of colonic polyps 10/31/2006   Dyslipidemia 07/01/2006    PCP: Jake Church MD   REFERRING PROVIDER: Julio Sicks MD   REFERRING DIAG:  Diagnosis  S22.080A (ICD-10-CM) - Wedge compression fracture of T11-T12 vertebra, initial encounter for closed fracture    Rationale for Evaluation and Treatment: Rehabilitation  THERAPY DIAG:  Other low back pain  Other abnormalities of gait and mobility  Pain in thoracic spine  Abnormal posture  ONSET DATE: Larey Seat in October of 2024   SUBJECTIVE:  SUBJECTIVE STATEMENT: 04/10/2023 no pain today, still having difficulty with transfers particularly in the mornings. Having difficulty w/ prolonged positioning as well. Feels pool has been helpful. Has been using theracane as well with good relief.   Eval: Patient fell in October of 2024. She suffered a t-12 wedge fx. She has been to the MD who reports her fx is healing well. 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  Continues to have significant pain with any prolonged positioning.  She is having difficulty with sit to stand transfers.  She feels stiff in the morning.  She continues to have pain when she extends to neutral.  PERTINENT HISTORY:  Arthritis (hands) potentially in the neck, cancer, diverticulosis, vertigo, potentially has RA.   PAIN:  Are you having pain? no: NPRS scale: 0 Pain location: Lower thoracic/ upper lumbar Pain description:  Aggravating factors: Any prolonged positioning Relieving factors: Changing position  PRECAUTIONS: None per patient she no longer has lifting restrictions but is advised to keep reasonable  RED FLAGS: None   WEIGHT BEARING RESTRICTIONS: No  FALLS:  Has patient fallen in last 6 months? Yes. Number of falls 1 fall 2nd to her dog    LIVING ENVIRONMENT: 4 steps inside of her house. Has an upstairs as well . Has some difficulty with the right leg. Pain around the right trochanteric bursa   OCCUPATION: retired   Hobbies:  [employer] her dog Active in her church  Likes to Travel     PLOF: Independent  PATIENT GOALS: to have improved strangth and fnereral mobility    NEXT MD VISIT:  March 27th    OBJECTIVE:  Note: Objective measures were completed at Evaluation unless otherwise noted.  DIAGNOSTIC FINDINGS:  X-rays:per patient fx is healing   PATIENT SURVEYS:  Modified Oswestry 18/50   COGNITION: Overall cognitive status: Within functional limits for tasks assessed     SENSATION: WFL    POSTURE: flexed trunk   PALPATION: Significant tenderness palpation from mid thoracic into the lower lumbar spine in her paraspinals  LUMBAR ROM:   AROM eval  Flexion Limited 50% not smooth movement. Pain coming back up  Extension Pain in neutral   Right lateral flexion   Left lateral flexion   Right rotation   Left rotation    (Blank rows = not tested)  L LOWER EXTREMITY MMT:    MMT Right eval Left eval  Hip flexion 15.8 19.8  Hip extension    Hip abduction 10.7 18.2  Hip adduction    Hip internal rotation    Hip external rotation    Knee flexion    Knee extension 15.6 25.1  Ankle dorsiflexion    Ankle plantarflexion    Ankle inversion    Ankle eversion     (Blank rows = not tested)    GAIT:    TREATMENT DATE:  [facility] Adult PT Treatment:                                                DATE: 04/10/23 Manual Therapy: Seated STM to BIL QL and thoracolumbar paraspinals  Therapeutic Activity: STS 2x5 raised mat cues for mechanics Bosu TKE push 2x8 BIL for improved LE pushing, cues for reduced compensations, breath control  Standing 6 inch step taps w/ unilat UE support; 2x8 BIL with cues for  reduced velocity, weight shifting, posture Time spent w/ education/discussion  on mechanics for transfers, rationale for interventions, activity modification/pacing strategies      [facility] Adult PT Treatment:                                                DATE: 04/07/23 Pt seen for aquatic therapy today.  Treatment took place in water 3.5-4.75 ft in depth at the [facility] pool. Temp of water was 91.  Pt entered/exited the pool via stairs in step-to pattern with single rail.  - unsupported walking forward/ backward with reciprocal arm swing -> with light resistance bells, multiple laps - side stepping with arm addct/ abdct  with light resistance bells, 3 laps - STS on 3rd step in water with forward arm reach x 6 -  forward runners step ups with light intermittent UE support on rail x 12 each LE - return to walking forward/ backward with arm swing  - wall push up/off x 10 with core engaged - TrA set with hollow long noodle pull down to thighs x 10 - staggered stance - kickboard row x 15 each LE forward - straddling yellow noodle, UE breast stroke arms: cycling - fig4 stretch at stairs x 10 sec each   [facility] Adult PT Treatment:                                                DATE: 04/02/23 Pt seen for aquatic therapy today.  Treatment took place in water 3.5-4.75 ft in depth at the [facility] pool. Temp of water was 91.  Pt entered/exited the pool via stairs in step-to pattern with single rail.  - unsupported walking forward/ backward with reciprocal arm swing, multiple laps - side stepping with arm addct/ abdct  with rainbow hand floats, 3 laps - farmer carry, walking forward/ backward with bilat rainbow -> single yellow hand float - seated on bench in water with feet on blue step: STS with forward arm reach x 10 -cues for neutral head, hinge at hip and controlled descent - alternating toe taps to 1st/2nd without support (easy) - forward step ups with light UE support on rail x 10 each LE - UE on wall: hip abdct/ addct 2 x 10 - return to walking  forward/ backward with arm swing  - straddling yellow noodle, UE breast stroke arms: cycling - (reviewed verbally how to put dog leash on with hip hinge) - fig4 stretch at stairs x 10 sec each   [facility] Adult PT Treatment:                                                DATE: 03/31/23 Pt seen for aquatic therapy today.  Treatment took place in water 3.5-4.75 ft in depth at the [facility] pool. Temp of water was 91.  Pt entered/exited the pool via stairs in step-to pattern with single rail.  - Intro to aquatic therapy principles - unsupported walking forward/ backward with reciprocal arm swing, multiple laps - side stepping with arm addct/ abdct -> with rainbow hand floats -  farmer carry, walking forward/ backward with bilat rainbow -> single yellow hand float - seated on bench in water with feet on blue step: STS with forward arm reach with hands on yellow hand floats x 5 -> without hand floats x 5, cues for neutral head, hinge at hip and controlled descent - straddling yellow noodle, UE breast stroke arms: cycling, suspended jumping jack LEs, cross country ski    Adult PT Treatment:                                                DATE: 3/6 Manual: skilled palpation of trigger points. Trigger point release to upper to low thoracic paraspinals in right side lying. Review of trigger points with exercises  There-ex: Ball roll 5x 5 sec Side to side stretch 5x 5 sec hold   Neuro-Re-ed  Row with TA breathing yellow 2x10  Shoulder extension with TA breathing 2x10 yellow                                                                                                                               Eval: Manual: Reviewed self soft tissue mobilization using Thera cane.  Reviewed where to purchase a pair of Thera cane.  Patient advised not to put pressure on this day on the muscle.    Exercises - Supine Lower Trunk Rotation  - 1 x daily - 7 x weekly - 3 sets - 10 reps - Theracane Over  Shoulder  - 1 x daily - 7 x weekly - 3 sets - 10 reps - Seated Bilateral Shoulder Flexion Towel Slide at Table Top  - 1 x daily - 7 x weekly - 3 sets - 10 reps   PATIENT EDUCATION:  Education details: HEP, symptom management  Person educated: Patient Education method: Explanation, Demonstration, Tactile cues, and Verbal cues Education comprehension: verbalized understanding, returned demonstration, verbal cues required, tactile cues required, and needs further education  HOME EXERCISE PROGRAM: Access Code: [email or web address] URL: [email or web address] Date: 04/10/2023 Prepared by: Fransisco Hertz  Exercises - Supine Lower Trunk Rotation  - 1 x daily - 7 x weekly - 3 sets - 10 reps - Theracane Over Shoulder  - 1 x daily - 7 x weekly - 3 sets - 10 reps - Seated Bilateral Shoulder Flexion Towel Slide at Table Top  - 1 x daily - 7 x weekly - 3 sets - 10 reps - Shoulder extension with resistance - Neutral  - 1 x daily - 7 x weekly - 3 sets - 10 reps - Scapular Retraction with Resistance  - 1 x daily - 7 x weekly - 3 sets - 10 reps - Step Taps with Unilateral Counter Support  - 1 x daily - 7 x weekly - 2 sets - 8 reps  ASSESSMENT:  CLINICAL IMPRESSION: Pt  arrives w/o pain, continues to endorse steady progress but notes continued difficulty w/ transfers in particular. Given this, much of today is spent w/ education/practice for transfers and weight shifting, although we begin with manual as above to reduce tightness/tenderness about thoracolumbar musculature w/ pt reporting good relief. Pt tolerates today's session well overall, continues to demonstrate altered mechanics but improves with repetition. Step taps produce a bit of fatigue in R hip but pt denies any pain, also adding in bosu TKE for improved pushing tolerance to assist with transfers. Kept majority of HEP intact but updated to include step taps w/ UE support given good response today. No adverse events, pt denies any pain on  departure. Recommend continuing along current POC in order to address relevant deficits and improve functional tolerance. Pt departs today's session in no acute distress, all voiced questions/concerns addressed appropriately from PT perspective.    From initial evaluation:  Patient is a 83 year old female status post fall leading to T12 wedge compression fracture.  Per MD she is lost 60% of her spinal segment height.  The patient significant pain with prolonged positioning.  She has difficulty with sit to stand transfers.  She is previously very active.  She previously went to water aerobics 3-4 times a week.  She presents with lower extremity weakness, decreased spinal mobility, decrease general functional mobility.  She would benefit from skilled therapy to return to active lifestyle.  OBJECTIVE IMPAIRMENTS: Abnormal gait, decreased activity tolerance, difficulty walking, decreased ROM, decreased strength, increased muscle spasms, postural dysfunction, and pain.   ACTIVITY LIMITATIONS: carrying, lifting, bending, sitting, standing, sleeping, stairs, transfers, and locomotion level  PARTICIPATION LIMITATIONS: meal prep, cleaning, laundry, driving, shopping, community activity, and yard work  PERSONAL FACTORS: Age and Time since onset of injury/illness/exacerbation are also affecting patient's functional outcome.   REHAB POTENTIAL: Good  CLINICAL DECISION MAKING: Evolving/moderate complexity  EVALUATION COMPLEXITY: Moderate   GOALS: Goals reviewed with patient? Yes  SHORT TERM GOALS: Target date: 04/22/2023    Patient will stand in a neutral position without increased back pain Baseline: Goal status: INITIAL  2.  Increase gross bilateral lower extremity strength by 5 pounds Baseline:  Goal status: INITIAL  3.  Patient will be independent with basic HEP on land and in water Baseline:  Goal status: INITIAL  4.  Patient will report a 50% reduction in tenderness to palpation in  bilateral paraspinal Baseline:  Goal status: INITIAL   LONG TERM GOALS: Target date: 05/20/2023    Patient will transfer sit to stand without use of hands in order to improve general functional mobility Baseline:  Goal status: INITIAL  2.  Patient will transfer out of bed without increased pain Baseline:  Goal status: INITIAL  3.  Patient will ambulate community distances without increased pain Baseline:  Goal status: INITIAL  4.  Patient will return to water aerobics Baseline:  Goal status: INITIAL  5.  Patient will go up/down 8 steps without increased pain Baseline:  Goal status: INITIAL  PLAN:  PT FREQUENCY: 2x/week  PT DURATION: 8 weeks  PLANNED INTERVENTIONS: 97110-Therapeutic exercises, 97530- Therapeutic activity, [id number]- Neuromuscular re-education, 97535- Self Care, [id number]- Manual therapy, [id number]- Gait training, [phone]- Aquatic Therapy, [id number]- Electrical stimulation (unattended), 97035- Ultrasound, Patient/Family education, Stair training, Taping, Dry Needling, DME instructions, Cryotherapy, and Moist heat   PLAN FOR NEXT SESSION:  Begin soft tissue mobilization to paraspinals as tolerated.  Assess tolerance to HEP.  Continue to work on movements within pain-free ranges of the lumbar spine and  shoulders.  Consider posterior chain exercises with weight rows and extensions if tolerated.  ER with yellow band.  Aquatics begin postural training. Begin standing endurance training. Work on sit to stands. Begin stair training.   Ashley Murrain PT, DPT 04/10/2023 9:42 AM   [facility] [address] [place], [place], [address] Phone: [phone]   Fax:  [phone]

## 2023-04-10 ENCOUNTER — Ambulatory Visit (HOSPITAL_BASED_OUTPATIENT_CLINIC_OR_DEPARTMENT_OTHER): Admitting: Physical Therapy

## 2023-04-10 ENCOUNTER — Encounter (HOSPITAL_BASED_OUTPATIENT_CLINIC_OR_DEPARTMENT_OTHER): Payer: Self-pay | Admitting: Physical Therapy

## 2023-04-10 DIAGNOSIS — M5459 Other low back pain: Secondary | ICD-10-CM

## 2023-04-10 DIAGNOSIS — R269 Unspecified abnormalities of gait and mobility: Secondary | ICD-10-CM | POA: Diagnosis not present

## 2023-04-10 DIAGNOSIS — R293 Abnormal posture: Secondary | ICD-10-CM

## 2023-04-10 DIAGNOSIS — R2689 Other abnormalities of gait and mobility: Secondary | ICD-10-CM

## 2023-04-10 DIAGNOSIS — M546 Pain in thoracic spine: Secondary | ICD-10-CM

## 2023-04-10 DIAGNOSIS — S22080A Wedge compression fracture of T11-T12 vertebra, initial encounter for closed fracture: Secondary | ICD-10-CM | POA: Diagnosis not present

## 2023-04-14 ENCOUNTER — Ambulatory Visit (HOSPITAL_BASED_OUTPATIENT_CLINIC_OR_DEPARTMENT_OTHER): Admitting: Physical Therapy

## 2023-04-14 ENCOUNTER — Encounter (HOSPITAL_BASED_OUTPATIENT_CLINIC_OR_DEPARTMENT_OTHER): Payer: Self-pay | Admitting: Physical Therapy

## 2023-04-14 DIAGNOSIS — R2689 Other abnormalities of gait and mobility: Secondary | ICD-10-CM

## 2023-04-14 DIAGNOSIS — M5459 Other low back pain: Secondary | ICD-10-CM

## 2023-04-14 DIAGNOSIS — R293 Abnormal posture: Secondary | ICD-10-CM | POA: Diagnosis not present

## 2023-04-14 DIAGNOSIS — S22080A Wedge compression fracture of T11-T12 vertebra, initial encounter for closed fracture: Secondary | ICD-10-CM | POA: Diagnosis not present

## 2023-04-14 DIAGNOSIS — M546 Pain in thoracic spine: Secondary | ICD-10-CM

## 2023-04-14 DIAGNOSIS — R269 Unspecified abnormalities of gait and mobility: Secondary | ICD-10-CM | POA: Diagnosis not present

## 2023-04-14 NOTE — Therapy (Signed)
 OUTPATIENT PHYSICAL THERAPY THORACOLUMBAR TREATMENT   Patient Name: [name] MRN: [id number] DOB:1940-09-15, 83 y.o., female Today's Date: 04/14/2023  END OF SESSION:  PT End of Session - 04/14/23 0931     Visit Number 7    Number of Visits 16    Date for PT Re-Evaluation 05/20/23    PT Start Time 0930    PT Stop Time 1010    PT Time Calculation (min) 40 min    Activity Tolerance Patient tolerated treatment well    Behavior During Therapy WFL for tasks assessed/performed              Past Medical History:  Diagnosis Date   Arthritis    Cancer (HCC)    skin  cancer Nose   COLONIC POLYPS, HX OF 10/31/2006   DIVERTICULOSIS, COLON 10/21/2008   ELEVATED BP READING WITHOUT DX HYPERTENSION 07/12/2009   HYPERLIPIDEMIA 07/01/2006   Hypertension    PALPITATIONS, OCCASIONAL 10/21/2008   VERTIGO 07/25/2009   Past Surgical History:  Procedure Laterality Date   COLONOSCOPY  02/24/2013   LAMINECTOMY     lumbar   ROTATOR CUFF REPAIR     VAGINAL HYSTERECTOMY     Patient Active Problem List   Diagnosis Date Noted   Acute bilateral thoracic back pain 11/14/2022   Muscle fatigue 05/28/2022   Body aches 09/13/2021   Fatigue 09/13/2021   Acute right-sided low back pain without sciatica 09/13/2021   Nausea 09/13/2021   DOE (dyspnea on exertion) 09/13/2021   Acute cough 09/13/2021   Abdominal bloating 09/13/2021   Excessive cerumen in both ear canals 07/05/2021   Subclinical hypothyroidism 07/04/2021   Rash and nonspecific skin eruption 07/21/2019   Pain in left knee 03/25/2019   Osteopenia 01/16/2019   Anxiety 12/01/2018   Hypertension 07/23/2018   Family history of diabetes mellitus (DM) 07/20/2018   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   Personal history of other malignant neoplasm of skin 03/05/2017   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   Vitamin D deficiency 02/06/2010   VERTIGO 07/25/2009    Diverticulosis of colon 10/21/2008   PALPITATIONS, OCCASIONAL 10/21/2008   History of colonic polyps 10/31/2006   Dyslipidemia 07/01/2006    PCP: Jake Church MD   REFERRING PROVIDER: Julio Sicks MD   REFERRING DIAG:  Diagnosis  S22.080A (ICD-10-CM) - Wedge compression fracture of T11-T12 vertebra, initial encounter for closed fracture    Rationale for Evaluation and Treatment: Rehabilitation  THERAPY DIAG:  Other low back pain  Other abnormalities of gait and mobility  Pain in thoracic spine  ONSET DATE: Larey Seat in October of 2024   SUBJECTIVE:  SUBJECTIVE STATEMENT: 04/14/2023 "I picked up sticks in the yard over the weekend and my back is a little sore."  Eval: Patient fell in October of 2024. She suffered a t-12 wedge fx. She has been to the MD who reports her fx is healing well. 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  Continues to have significant pain with any prolonged positioning.  She is having difficulty with sit to stand transfers.  She feels stiff in the morning.  She continues to have pain when she extends to neutral.  PERTINENT HISTORY:  Arthritis (hands) potentially in the neck, cancer, diverticulosis, vertigo, potentially has RA.   PAIN:  Are you having pain? no: NPRS scale: 2 Pain location: Lower thoracic/ upper lumbar Pain description:  Aggravating factors: Any prolonged positioning Relieving factors: Changing position  PRECAUTIONS: None per patient she no longer has lifting restrictions but is advised to keep reasonable  RED FLAGS: None   WEIGHT BEARING RESTRICTIONS: No  FALLS:  Has patient fallen in last 6 months? Yes. Number of falls 1 fall 2nd to her dog   LIVING ENVIRONMENT: 4 steps inside of her house. Has an upstairs as well . Has some  difficulty with the right leg. Pain around the right trochanteric bursa   OCCUPATION: retired   Hobbies:  [employer] her dog Active in her church  Likes to Travel     PLOF: Independent  PATIENT GOALS: to have improved strangth and fnereral mobility    NEXT MD VISIT:  March 27th    OBJECTIVE:  Note: Objective measures were completed at Evaluation unless otherwise noted.  DIAGNOSTIC FINDINGS:  X-rays:per patient fx is healing   PATIENT SURVEYS:  Modified Oswestry 18/50   COGNITION: Overall cognitive status: Within functional limits for tasks assessed     SENSATION: WFL    POSTURE: flexed trunk   PALPATION: Significant tenderness palpation from mid thoracic into the lower lumbar spine in her paraspinals  LUMBAR ROM:   AROM eval  Flexion Limited 50% not smooth movement. Pain coming back up  Extension Pain in neutral   Right lateral flexion   Left lateral flexion   Right rotation   Left rotation    (Blank rows = not tested)  L LOWER EXTREMITY MMT:    MMT Right eval Left eval  Hip flexion 15.8 19.8  Hip extension    Hip abduction 10.7 18.2  Hip adduction    Hip internal rotation    Hip external rotation    Knee flexion    Knee extension 15.6 25.1  Ankle dorsiflexion    Ankle plantarflexion    Ankle inversion    Ankle eversion     (Blank rows = not tested)    GAIT:    TREATMENT DATE:  [facility] Adult PT Treatment:                                                DATE: 04/14/23 Pt seen for aquatic therapy today.  Treatment took place in water 3.5-4.75 ft in depth at the [facility] pool. Temp of water was 91.  Pt entered/exited the pool via stairs in step-to pattern with single rail.   - unsupported walking forward/ backward with reciprocal arm swing -> with light resistance bells, multiple laps - side stepping with arm addct/ abdct  with light  resistance bells, 3 laps -Arm swing using green bells staggered stances then  wide stance 2 sets: 5 slow then 5 fast - STS on 3rd step in water with forward arm reach x 6 - hip hinge x10 verbal and TC as well as demonstration. -step ups leading R/L->runners step ups -  forward runners step ups with light intermittent UE support on rail x 12 each LE - return to walking forward/ backward with arm swing   - TrA set with hollow long noodle pull down to thighs x 10 wide stance then staggered stances  - wall push up/off x 10 with core engaged  - staggered stance - kickboard row x 15 each LE forward - straddling yellow noodle, UE breast stroke arms: cycling - fig4 stretch at stairs x 10 sec each     [facility] Adult PT Treatment:                                                DATE: 04/10/23 Manual Therapy: Seated STM to BIL QL and thoracolumbar paraspinals  Therapeutic Activity: STS 2x5 raised mat cues for mechanics Bosu TKE push 2x8 BIL for improved LE pushing, cues for reduced compensations, breath control  Standing 6 inch step taps w/ unilat UE support; 2x8 BIL with cues for reduced velocity, weight shifting, posture Time spent w/ education/discussion on mechanics for transfers, rationale for interventions, activity modification/pacing strategies      [facility] Adult PT Treatment:                                                DATE: 04/07/23 Pt seen for aquatic therapy today.  Treatment took place in water 3.5-4.75 ft in depth at the [facility] pool. Temp of water was 91.  Pt entered/exited the pool via stairs in step-to pattern with single rail.  - unsupported walking forward/ backward with reciprocal arm swing -> with light resistance bells, multiple laps - side stepping with arm addct/ abdct  with light resistance bells, 3 laps - STS on 3rd step in water with forward arm reach x 6 -  forward runners step ups with light intermittent UE support on rail x 12 each LE - return to walking forward/ backward with arm swing  - wall push up/off x 10 with core  engaged - TrA set with hollow long noodle pull down to thighs x 10 - staggered stance - kickboard row x 15 each LE forward - straddling yellow noodle, UE breast stroke arms: cycling - fig4 stretch at stairs x 10 sec each   [facility] Adult PT Treatment:                                                DATE: 04/02/23 Pt seen for aquatic therapy today.  Treatment took place in water 3.5-4.75 ft in depth at the [facility] pool. Temp of water was 91.  Pt entered/exited the pool via stairs in step-to pattern with single rail.  - unsupported walking forward/ backward with reciprocal arm swing, multiple laps - side stepping with arm addct/ abdct  with rainbow  hand floats, 3 laps - farmer carry, walking forward/ backward with bilat rainbow -> single yellow hand float - seated on bench in water with feet on blue step: STS with forward arm reach x 10 -cues for neutral head, hinge at hip and controlled descent - alternating toe taps to 1st/2nd without support (easy) - forward step ups with light UE support on rail x 10 each LE - UE on wall: hip abdct/ addct 2 x 10 - return to walking forward/ backward with arm swing  - straddling yellow noodle, UE breast stroke arms: cycling - (reviewed verbally how to put dog leash on with hip hinge) - fig4 stretch at stairs x 10 sec each   [facility] Adult PT Treatment:                                                DATE: 03/31/23 Pt seen for aquatic therapy today.  Treatment took place in water 3.5-4.75 ft in depth at the [facility] pool. Temp of water was 91.  Pt entered/exited the pool via stairs in step-to pattern with single rail.  - Intro to aquatic therapy principles - unsupported walking forward/ backward with reciprocal arm swing, multiple laps - side stepping with arm addct/ abdct -> with rainbow hand floats - farmer carry, walking forward/ backward with bilat rainbow -> single yellow hand float - seated on bench in water with feet on blue step:  STS with forward arm reach with hands on yellow hand floats x 5 -> without hand floats x 5, cues for neutral head, hinge at hip and controlled descent - straddling yellow noodle, UE breast stroke arms: cycling, suspended jumping jack LEs, cross country ski    Adult PT Treatment:                                                DATE: 3/6 Manual: skilled palpation of trigger points. Trigger point release to upper to low thoracic paraspinals in right side lying. Review of trigger points with exercises  There-ex: Ball roll 5x 5 sec Side to side stretch 5x 5 sec hold   Neuro-Re-ed  Row with TA breathing yellow 2x10  Shoulder extension with TA breathing 2x10 yellow                                                                                                                               Eval: Manual: Reviewed self soft tissue mobilization using Thera cane.  Reviewed where to purchase a pair of Thera cane.  Patient advised not to put pressure on this day on the muscle.    Exercises - Supine Lower Trunk Rotation  - 1 x daily -  7 x weekly - 3 sets - 10 reps - Theracane Over Shoulder  - 1 x daily - 7 x weekly - 3 sets - 10 reps - Seated Bilateral Shoulder Flexion Towel Slide at Table Top  - 1 x daily - 7 x weekly - 3 sets - 10 reps   PATIENT EDUCATION:  Education details: HEP, symptom management  Person educated: Patient Education method: Explanation, Demonstration, Tactile cues, and Verbal cues Education comprehension: verbalized understanding, returned demonstration, verbal cues required, tactile cues required, and needs further education  HOME EXERCISE PROGRAM: Access Code: [email or web address] URL: [email or web address] Date: 04/10/2023 Prepared by: Fransisco Hertz  Exercises - Supine Lower Trunk Rotation  - 1 x daily - 7 x weekly - 3 sets - 10 reps - Theracane Over Shoulder  - 1 x daily - 7 x weekly - 3 sets - 10 reps - Seated Bilateral Shoulder Flexion Towel Slide at Table Top   - 1 x daily - 7 x weekly - 3 sets - 10 reps - Shoulder extension with resistance - Neutral  - 1 x daily - 7 x weekly - 3 sets - 10 reps - Scapular Retraction with Resistance  - 1 x daily - 7 x weekly - 3 sets - 10 reps - Step Taps with Unilateral Counter Support  - 1 x daily - 7 x weekly - 2 sets - 8 reps  ASSESSMENT:  CLINICAL IMPRESSION: Good toleration to progressed core and LE strengthening submerged.  Added hip hinging for posterior core engagement.  Good execution although requires VC for positioning an TC for weight shift. Cueing required for proper mechanics for stand-sit. She arrives with 2/10 pain with reduction after session. I have suggested that she begins to return to water class "Less Pain with Erskine Squibb" and report back how she did. She is in agreement and looking forward to it. Good session.    From initial evaluation:  Patient is a 83 year old female status post fall leading to T12 wedge compression fracture.  Per MD she is lost 60% of her spinal segment height.  The patient significant pain with prolonged positioning.  She has difficulty with sit to stand transfers.  She is previously very active.  She previously went to water aerobics 3-4 times a week.  She presents with lower extremity weakness, decreased spinal mobility, decrease general functional mobility.  She would benefit from skilled therapy to return to active lifestyle.  OBJECTIVE IMPAIRMENTS: Abnormal gait, decreased activity tolerance, difficulty walking, decreased ROM, decreased strength, increased muscle spasms, postural dysfunction, and pain.   ACTIVITY LIMITATIONS: carrying, lifting, bending, sitting, standing, sleeping, stairs, transfers, and locomotion level  PARTICIPATION LIMITATIONS: meal prep, cleaning, laundry, driving, shopping, community activity, and yard work  PERSONAL FACTORS: Age and Time since onset of injury/illness/exacerbation are also affecting patient's functional outcome.   REHAB POTENTIAL:  Good  CLINICAL DECISION MAKING: Evolving/moderate complexity  EVALUATION COMPLEXITY: Moderate   GOALS: Goals reviewed with patient? Yes  SHORT TERM GOALS: Target date: 04/22/2023    Patient will stand in a neutral position without increased back pain Baseline: Goal status: INITIAL  2.  Increase gross bilateral lower extremity strength by 5 pounds Baseline:  Goal status: INITIAL  3.  Patient will be independent with basic HEP on land and in water Baseline:  Goal status: INITIAL  4.  Patient will report a 50% reduction in tenderness to palpation in bilateral paraspinal Baseline:  Goal status: INITIAL   LONG TERM GOALS: Target date: 05/20/2023    Patient will  transfer sit to stand without use of hands in order to improve general functional mobility Baseline:  Goal status: INITIAL  2.  Patient will transfer out of bed without increased pain Baseline:  Goal status: INITIAL  3.  Patient will ambulate community distances without increased pain Baseline:  Goal status: INITIAL  4.  Patient will return to water aerobics Baseline:  Goal status: INITIAL  5.  Patient will go up/down 8 steps without increased pain Baseline:  Goal status: INITIAL  PLAN:  PT FREQUENCY: 2x/week  PT DURATION: 8 weeks  PLANNED INTERVENTIONS: 97110-Therapeutic exercises, 97530- Therapeutic activity, [id number]- Neuromuscular re-education, 97535- Self Care, 28413- Manual therapy, [id number]- Gait training, [phone]- Aquatic Therapy, 97014- Electrical stimulation (unattended), 97035- Ultrasound, Patient/Family education, Stair training, Taping, Dry Needling, DME instructions, Cryotherapy, and Moist heat   PLAN FOR NEXT SESSION:  Begin soft tissue mobilization to paraspinals as tolerated.  Assess tolerance to HEP.  Continue to work on movements within pain-free ranges of the lumbar spine and shoulders.  Consider posterior chain exercises with weight rows and extensions if tolerated.  ER with yellow  band.  Aquatics begin postural training. Begin standing endurance training. Work on sit to stands. Begin stair training.   [address] Maribel) Hang Ammon MPT 04/14/23 10:21 AM [facility] [address] [place], [place], [address] Phone: [phone]   Fax:  [phone]

## 2023-04-15 ENCOUNTER — Encounter: Payer: Self-pay | Admitting: Internal Medicine

## 2023-04-15 DIAGNOSIS — S22009A Unspecified fracture of unspecified thoracic vertebra, initial encounter for closed fracture: Secondary | ICD-10-CM | POA: Insufficient documentation

## 2023-04-15 NOTE — Progress Notes (Unsigned)
 Subjective:    Patient ID: [name], female    DOB: [birth year], 83 y.o.   MRN: [id number]     HPI [name] is here for follow up of her chronic medical problems.  Back pain - Doing PT - did not need a kyphoplasty.  Back pain is improving.       Medications and allergies reviewed with patient and updated if appropriate.  Current Outpatient Medications on File Prior to Visit  Medication Sig Dispense Refill   amLODipine (NORVASC) 5 MG tablet TAKE ONE TABLET BY MOUTH DAILY 90 tablet 0   calcium-vitamin D (OSCAL WITH D) 500-200 MG-UNIT tablet Calcium 500 + D (D3)  1 po qd otc     escitalopram (LEXAPRO) 5 MG tablet TAKE ONE TABLET BY MOUTH ONCE DAILY 30 tablet 3   hydroxychloroquine (PLAQUENIL) 200 MG tablet Take 200 mg by mouth 2 (two) times daily.     rosuvastatin (CRESTOR) 5 MG tablet TAKE ONE TABLET BY MOUTH THREE TIMES A WEEK 39 tablet 1   No current facility-administered medications on file prior to visit.     Review of Systems  Constitutional:  Negative for fever.  Respiratory:  Negative for cough, shortness of breath and wheezing.   Cardiovascular:  Negative for chest pain, palpitations and leg swelling.  Neurological:  Negative for light-headedness and headaches.       Objective:   Vitals:   04/16/23 1404  BP: 120/76  Pulse: 63  SpO2: 98%   BP Readings from Last 3 Encounters:  04/16/23 120/76  11/15/22 126/68  11/14/22 138/64   Wt Readings from Last 3 Encounters:  04/16/23 174 lb (78.9 kg)  11/15/22 174 lb 12.8 oz (79.3 kg)  11/14/22 174 lb (78.9 kg)   Body mass index is 27.25 kg/m.    Physical Exam Constitutional:      General: She is not in acute distress.    Appearance: Normal appearance.  HENT:     Head: Normocephalic and atraumatic.  Eyes:     Conjunctiva/sclera: Conjunctivae normal.  Cardiovascular:     Rate and Rhythm: Normal rate and regular rhythm.     Heart sounds: Normal heart sounds.  Pulmonary:     Effort:  Pulmonary effort is normal. No respiratory distress.     Breath sounds: Normal breath sounds. No wheezing.  Musculoskeletal:     Cervical back: Neck supple.     Right lower leg: No edema.     Left lower leg: No edema.  Lymphadenopathy:     Cervical: No cervical adenopathy.  Skin:    General: Skin is warm and dry.     Findings: No rash.  Neurological:     Mental Status: She is alert. Mental status is at baseline.  Psychiatric:        Mood and Affect: Mood normal.        Behavior: Behavior normal.        Lab Results  Component Value Date   WBC 4.1 05/28/2022   HGB 12.3 05/28/2022   HCT 36.7 05/28/2022   PLT 294.0 05/28/2022   GLUCOSE 87 05/28/2022   CHOL 217 (H) 05/28/2022   TRIG 64.0 05/28/2022   HDL 97.10 05/28/2022   LDLDIRECT 149.6 03/19/2012   LDLCALC 108 (H) 05/28/2022   ALT 17 05/28/2022   AST 18 05/28/2022   NA 141 05/28/2022   K 3.7 05/28/2022   CL 104 05/28/2022   CREATININE 0.57 05/28/2022   BUN 16 05/28/2022  CO2 31 05/28/2022   TSH 5.39 05/28/2022   HGBA1C 5.6 07/16/2021     Assessment & Plan:    See Problem List for Assessment and Plan of chronic medical problems.

## 2023-04-15 NOTE — Patient Instructions (Addendum)
      Blood work was ordered.      Get about 600 mg of calcium in a supplement and eat a high calcium diet.  Total daily goal is 1200 mg a day.    Medications changes include :   None   A bone density was ordered.     Return in about 6 months (around 10/17/2023) for Physical Exam.

## 2023-04-16 ENCOUNTER — Ambulatory Visit: Admitting: Internal Medicine

## 2023-04-16 VITALS — BP 120/76 | HR 63 | Wt 174.0 lb

## 2023-04-16 DIAGNOSIS — M85851 Other specified disorders of bone density and structure, right thigh: Secondary | ICD-10-CM | POA: Diagnosis not present

## 2023-04-16 DIAGNOSIS — E785 Hyperlipidemia, unspecified: Secondary | ICD-10-CM | POA: Diagnosis not present

## 2023-04-16 DIAGNOSIS — I10 Essential (primary) hypertension: Secondary | ICD-10-CM

## 2023-04-16 DIAGNOSIS — E038 Other specified hypothyroidism: Secondary | ICD-10-CM

## 2023-04-16 DIAGNOSIS — E559 Vitamin D deficiency, unspecified: Secondary | ICD-10-CM | POA: Diagnosis not present

## 2023-04-16 DIAGNOSIS — M1990 Unspecified osteoarthritis, unspecified site: Secondary | ICD-10-CM | POA: Diagnosis not present

## 2023-04-16 DIAGNOSIS — S22009D Unspecified fracture of unspecified thoracic vertebra, subsequent encounter for fracture with routine healing: Secondary | ICD-10-CM | POA: Diagnosis not present

## 2023-04-16 DIAGNOSIS — F419 Anxiety disorder, unspecified: Secondary | ICD-10-CM

## 2023-04-16 NOTE — Assessment & Plan Note (Signed)
Chronic Regular exercise and healthy diet encouraged Lipids controlled Continue Crestor 5 mg 3 times a week

## 2023-04-16 NOTE — Assessment & Plan Note (Signed)
 Chronic Related to fall 10/2022 Healing w/o intervention Has seen neurosurgery Doing PT

## 2023-04-16 NOTE — Assessment & Plan Note (Signed)
 Chronic Following with rheumatology Taking Plaquenil  controlled

## 2023-04-16 NOTE — Assessment & Plan Note (Signed)
Chronic Controlled, Stable Continue Lexapro 5 mg daily 

## 2023-04-16 NOTE — Assessment & Plan Note (Signed)
 Chronic last TSH within normal range Check tsh

## 2023-04-16 NOTE — Assessment & Plan Note (Signed)
Chronic Continue vitamin D supplementation Check vitamin d level

## 2023-04-16 NOTE — Assessment & Plan Note (Signed)
 Chronic BP well controlled  monitor BP at home Continue amlodipine 5 mg daily Cmp, cbc

## 2023-04-16 NOTE — Assessment & Plan Note (Signed)
 Chronic DEXA due - ordered Recent compression fx of T11, T12 - discussed this qualifies her for treatment Continue regular exercise she is taking calcium and vitamin d daily Will discuss treatment after dexa

## 2023-04-17 DIAGNOSIS — S22080A Wedge compression fracture of T11-T12 vertebra, initial encounter for closed fracture: Secondary | ICD-10-CM | POA: Diagnosis not present

## 2023-04-17 DIAGNOSIS — Z6827 Body mass index (BMI) 27.0-27.9, adult: Secondary | ICD-10-CM | POA: Diagnosis not present

## 2023-04-18 ENCOUNTER — Encounter (HOSPITAL_BASED_OUTPATIENT_CLINIC_OR_DEPARTMENT_OTHER): Admitting: Physical Therapy

## 2023-04-21 ENCOUNTER — Ambulatory Visit (HOSPITAL_BASED_OUTPATIENT_CLINIC_OR_DEPARTMENT_OTHER): Admitting: Physical Therapy

## 2023-04-21 ENCOUNTER — Other Ambulatory Visit: Payer: Self-pay | Admitting: Internal Medicine

## 2023-04-21 ENCOUNTER — Encounter (HOSPITAL_BASED_OUTPATIENT_CLINIC_OR_DEPARTMENT_OTHER): Payer: Self-pay | Admitting: Physical Therapy

## 2023-04-21 DIAGNOSIS — R2689 Other abnormalities of gait and mobility: Secondary | ICD-10-CM

## 2023-04-21 DIAGNOSIS — S22080A Wedge compression fracture of T11-T12 vertebra, initial encounter for closed fracture: Secondary | ICD-10-CM | POA: Diagnosis not present

## 2023-04-21 DIAGNOSIS — M546 Pain in thoracic spine: Secondary | ICD-10-CM

## 2023-04-21 DIAGNOSIS — M5459 Other low back pain: Secondary | ICD-10-CM

## 2023-04-21 DIAGNOSIS — R293 Abnormal posture: Secondary | ICD-10-CM | POA: Diagnosis not present

## 2023-04-21 DIAGNOSIS — R269 Unspecified abnormalities of gait and mobility: Secondary | ICD-10-CM | POA: Diagnosis not present

## 2023-04-21 NOTE — Therapy (Signed)
 OUTPATIENT PHYSICAL THERAPY THORACOLUMBAR TREATMENT   Patient Name: [name] MRN: [id number] DOB:1940-11-14, 83 y.o., female Today's Date: 04/21/2023  END OF SESSION:  PT End of Session - 04/21/23 0933     Visit Number 8    Number of Visits 16    Date for PT Re-Evaluation 05/20/23    PT Start Time 0931    PT Stop Time 1012    PT Time Calculation (min) 41 min    Behavior During Therapy [facility] for tasks assessed/performed              Past Medical History:  Diagnosis Date   Arthritis    Cancer (HCC)    skin  cancer Nose   COLONIC POLYPS, HX OF 10/31/2006   DIVERTICULOSIS, COLON 10/21/2008   ELEVATED BP READING WITHOUT DX HYPERTENSION 07/12/2009   HYPERLIPIDEMIA 07/01/2006   Hypertension    PALPITATIONS, OCCASIONAL 10/21/2008   VERTIGO 07/25/2009   Past Surgical History:  Procedure Laterality Date   COLONOSCOPY  02/24/2013   LAMINECTOMY     lumbar   ROTATOR CUFF REPAIR     VAGINAL HYSTERECTOMY     Patient Active Problem List   Diagnosis Date Noted   Closed traumatic compression fracture of thoracic vertebra (HCC), T11, T12  10/2022 04/15/2023   Acute bilateral thoracic back pain 11/14/2022   Muscle fatigue 05/28/2022   DOE (dyspnea on exertion) 09/13/2021   Subclinical hypothyroidism 07/04/2021   Rash and nonspecific skin eruption 07/21/2019   Pain in left knee 03/25/2019   Osteopenia 01/16/2019   Anxiety 12/01/2018   Hypertension 07/23/2018   Family history of diabetes mellitus (DM) 07/20/2018   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   Personal history of other malignant neoplasm of skin 03/05/2017   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   Inflammatory arthritis - [facility] 03/21/2016   Vitamin D deficiency 02/06/2010   Diverticulosis of colon 10/21/2008   PALPITATIONS, OCCASIONAL 10/21/2008   History of colonic polyps 10/31/2006   Dyslipidemia 07/01/2006    PCP: Jake Church MD   REFERRING PROVIDER: Julio Sicks MD   REFERRING  DIAG:  Diagnosis  S22.080A (ICD-10-CM) - Wedge compression fracture of T11-T12 vertebra, initial encounter for closed fracture    Rationale for Evaluation and Treatment: Rehabilitation  THERAPY DIAG:  Other low back pain  Other abnormalities of gait and mobility  Pain in thoracic spine  Abnormal posture  ONSET DATE: Larey Seat in October of 2024   SUBJECTIVE:  SUBJECTIVE STATEMENT: 04/21/2023 "Dr. Dutch Quint released me. He told me I will always have pain and I need to know how to manage it."   Pt reports she stretches after her shower and does the HEP each day.  "I have a bone density test on the 7th". Pt reports she is able to bend down to put dog leash on without pain now.   Eval: Patient fell in October of 2024. She suffered a t-12 wedge fx. She has been to the MD who reports her fx is healing well. 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  Continues to have significant pain with any prolonged positioning.  She is having difficulty with sit to stand transfers.  She feels stiff in the morning.  She continues to have pain when she extends to neutral.  PERTINENT HISTORY:  Arthritis (hands) potentially in the neck, cancer, diverticulosis, vertigo, potentially has RA.   PAIN:  Are you having pain? no: NPRS scale: 0/10 Pain location: Lower thoracic/ upper lumbar Pain description:  Aggravating factors: Any prolonged positioning Relieving factors: Changing position  PRECAUTIONS: None per patient she no longer has lifting restrictions but is advised to keep reasonable  RED FLAGS: None   WEIGHT BEARING RESTRICTIONS: No  FALLS:  Has patient fallen in last 6 months? Yes. Number of falls 1 fall 2nd to her dog   LIVING ENVIRONMENT: 4 steps inside of her house. Has an upstairs as well . Has  some difficulty with the right leg. Pain around the right trochanteric bursa   OCCUPATION: retired   Hobbies:  [employer] her dog Active in her church  Likes to Travel     PLOF: Independent  PATIENT GOALS: to have improved strangth and fnereral mobility    NEXT MD VISIT:     OBJECTIVE:  Note: Objective measures were completed at Evaluation unless otherwise noted.  DIAGNOSTIC FINDINGS:  X-rays:per patient fx is healing   PATIENT SURVEYS:  Modified Oswestry 18/50   04/21/23:  22% limited  COGNITION: Overall cognitive status: Within functional limits for tasks assessed     SENSATION: WFL    POSTURE: flexed trunk   PALPATION: Significant tenderness palpation from mid thoracic into the lower lumbar spine in her paraspinals  LUMBAR ROM:   AROM eval  Flexion Limited 50% not smooth movement. Pain coming back up  Extension Pain in neutral   Right lateral flexion   Left lateral flexion   Right rotation   Left rotation    (Blank rows = not tested)  L LOWER EXTREMITY MMT:    MMT Right eval Left eval Right  04/21/23 Left  04/21/23  Hip flexion 15.8 19.8 32.1 37.7  Hip extension      Hip abduction 10.7 18.2 25.5 30.3  Hip adduction      Hip internal rotation      Hip external rotation      Knee flexion      Knee extension 15.6 25.1 39.3 40.9  Ankle dorsiflexion      Ankle plantarflexion      Ankle inversion      Ankle eversion       (Blank rows = not tested)    GAIT:    TREATMENT DATE:  [facility] Adult PT Treatment:  DATE: 04/21/23 Pt seen for aquatic therapy today.  Treatment took place in water 3.5-4.75 ft in depth at the [facility] pool. Temp of water was 91.  Pt entered/exited the pool via stairs in step-to pattern with single rail.    MMT/ mod oswestry test  - unsupported walking forward/ backward with reciprocal arm swing - side stepping with arm addct/ abdct  with yellow  hand floats - farmers carry with single and bilat yellow hand floats at side, marching forward / backward - hip hinge with UE on yellow hand floats, cues for avoiding arching thoracic area - TrA with hollow long noodle pull down to thighs x 10 wide stance then staggered stances - wall push up/off x 10 with core engaged - walk width of pool -> 10 more push offs  - staggered stance - kickboard row x 15 each LE forward - straddling yellow noodle, UE breast stroke arms: cycling - fig4 stretch at stairs x 10 sec each   [facility] Adult PT Treatment:                                                DATE: 04/14/23 Pt seen for aquatic therapy today.  Treatment took place in water 3.5-4.75 ft in depth at the [facility] pool. Temp of water was 91.  Pt entered/exited the pool via stairs in step-to pattern with single rail.   - unsupported walking forward/ backward with reciprocal arm swing -> with light resistance bells, multiple laps - side stepping with arm addct/ abdct  with light resistance bells, 3 laps -Arm swing using green bells staggered stances then wide stance 2 sets: 5 slow then 5 fast - STS on 3rd step in water with forward arm reach x 6 - hip hinge x10 verbal and TC as well as demonstration. -step ups leading R/L->runners step ups -  forward runners step ups with light intermittent UE support on rail x 12 each LE - return to walking forward/ backward with arm swing  - TrA set with hollow long noodle pull down to thighs x 10 wide stance then staggered stances - wall push up/off x 10 with core engaged - staggered stance - kickboard row x 15 each LE forward - straddling yellow noodle, UE breast stroke arms: cycling - fig4 stretch at stairs x 10 sec each     [facility] Adult PT Treatment:                                                DATE: 04/10/23 Manual Therapy: Seated STM to BIL QL and thoracolumbar paraspinals  Therapeutic Activity: STS 2x5 raised mat cues for mechanics Bosu TKE  push 2x8 BIL for improved LE pushing, cues for reduced compensations, breath control  Standing 6 inch step taps w/ unilat UE support; 2x8 BIL with cues for reduced velocity, weight shifting, posture Time spent w/ education/discussion on mechanics for transfers, rationale for interventions, activity modification/pacing strategies      [facility] Adult PT Treatment:  DATE: 04/07/23 Pt seen for aquatic therapy today.  Treatment took place in water 3.5-4.75 ft in depth at the [facility] pool. Temp of water was 91.  Pt entered/exited the pool via stairs in step-to pattern with single rail.  - unsupported walking forward/ backward with reciprocal arm swing -> with light resistance bells, multiple laps - side stepping with arm addct/ abdct  with light resistance bells, 3 laps - STS on 3rd step in water with forward arm reach x 6 -  forward runners step ups with light intermittent UE support on rail x 12 each LE - return to walking forward/ backward with arm swing  - wall push up/off x 10 with core engaged - TrA set with hollow long noodle pull down to thighs x 10 - staggered stance - kickboard row x 15 each LE forward - straddling yellow noodle, UE breast stroke arms: cycling - fig4 stretch at stairs x 10 sec each   [facility] Adult PT Treatment:                                                DATE: 04/02/23 Pt seen for aquatic therapy today.  Treatment took place in water 3.5-4.75 ft in depth at the [facility] pool. Temp of water was 91.  Pt entered/exited the pool via stairs in step-to pattern with single rail.  - unsupported walking forward/ backward with reciprocal arm swing, multiple laps - side stepping with arm addct/ abdct  with rainbow hand floats, 3 laps - farmer carry, walking forward/ backward with bilat rainbow -> single yellow hand float - seated on bench in water with feet on blue step: STS with forward arm reach x 10 -cues for  neutral head, hinge at hip and controlled descent - alternating toe taps to 1st/2nd without support (easy) - forward step ups with light UE support on rail x 10 each LE - UE on wall: hip abdct/ addct 2 x 10 - return to walking forward/ backward with arm swing  - straddling yellow noodle, UE breast stroke arms: cycling - (reviewed verbally how to put dog leash on with hip hinge) - fig4 stretch at stairs x 10 sec each   [facility] Adult PT Treatment:                                                DATE: 03/31/23 Pt seen for aquatic therapy today.  Treatment took place in water 3.5-4.75 ft in depth at the [facility] pool. Temp of water was 91.  Pt entered/exited the pool via stairs in step-to pattern with single rail.  - Intro to aquatic therapy principles - unsupported walking forward/ backward with reciprocal arm swing, multiple laps - side stepping with arm addct/ abdct -> with rainbow hand floats - farmer carry, walking forward/ backward with bilat rainbow -> single yellow hand float - seated on bench in water with feet on blue step: STS with forward arm reach with hands on yellow hand floats x 5 -> without hand floats x 5, cues for neutral head, hinge at hip and controlled descent - straddling yellow noodle, UE breast stroke arms: cycling, suspended jumping jack LEs, cross country ski    Adult PT Treatment:  DATE: 3/6 Manual: skilled palpation of trigger points. Trigger point release to upper to low thoracic paraspinals in right side lying. Review of trigger points with exercises  There-ex: Ball roll 5x 5 sec Side to side stretch 5x 5 sec hold   Neuro-Re-ed  Row with TA breathing yellow 2x10  Shoulder extension with TA breathing 2x10 yellow                                                                                                                               Eval: Manual: Reviewed self soft tissue mobilization using Thera cane.   Reviewed where to purchase a pair of Thera cane.  Patient advised not to put pressure on this day on the muscle.    Exercises - Supine Lower Trunk Rotation  - 1 x daily - 7 x weekly - 3 sets - 10 reps - Theracane Over Shoulder  - 1 x daily - 7 x weekly - 3 sets - 10 reps - Seated Bilateral Shoulder Flexion Towel Slide at Table Top  - 1 x daily - 7 x weekly - 3 sets - 10 reps   PATIENT EDUCATION:  Education details: HEP, symptom management  Person educated: Patient Education method: Explanation, Demonstration, Tactile cues, and Verbal cues Education comprehension: verbalized understanding, returned demonstration, verbal cues required, tactile cues required, and needs further education  HOME EXERCISE PROGRAM: Access Code: [email or web address] URL: [email or web address] Date: 04/10/2023 Prepared by: Fransisco Hertz  Exercises - Supine Lower Trunk Rotation  - 1 x daily - 7 x weekly - 3 sets - 10 reps - Theracane Over Shoulder  - 1 x daily - 7 x weekly - 3 sets - 10 reps - Seated Bilateral Shoulder Flexion Towel Slide at Table Top  - 1 x daily - 7 x weekly - 3 sets - 10 reps - Shoulder extension with resistance - Neutral  - 1 x daily - 7 x weekly - 3 sets - 10 reps - Scapular Retraction with Resistance  - 1 x daily - 7 x weekly - 3 sets - 10 reps - Step Taps with Unilateral Counter Support  - 1 x daily - 7 x weekly - 2 sets - 8 reps AQUATIC Access Code: [email or web address] URL: [email or web address] Date: 04/21/2023 Prepared by: [facility] This aquatic home exercise program from [employer] utilizes pictures from land based exercises, but has been adapted prior to lamination and issuance.    ASSESSMENT:  CLINICAL IMPRESSION: Significant improvement in LE strength with MMT.  Oswestry slightly worse, but pt reporting overall good improvement in functional mobility.  She tolerated session without production of symptoms.  Less cues required for posture.  Issued laminated HEP; will review and finalize during next pool visit.  Pt has met all STGs.      From initial evaluation:  Patient is a 83 year old female status post fall leading to T12 wedge compression fracture.  Per MD she is lost 60%  of her spinal segment height.  The patient significant pain with prolonged positioning.  She has difficulty with sit to stand transfers.  She is previously very active.  She previously went to water aerobics 3-4 times a week.  She presents with lower extremity weakness, decreased spinal mobility, decrease general functional mobility.  She would benefit from skilled therapy to return to active lifestyle.  OBJECTIVE IMPAIRMENTS: Abnormal gait, decreased activity tolerance, difficulty walking, decreased ROM, decreased strength, increased muscle spasms, postural dysfunction, and pain.   ACTIVITY LIMITATIONS: carrying, lifting, bending, sitting, standing, sleeping, stairs, transfers, and locomotion level  PARTICIPATION LIMITATIONS: meal prep, cleaning, laundry, driving, shopping, community activity, and yard work  PERSONAL FACTORS: Age and Time since onset of injury/illness/exacerbation are also affecting patient's functional outcome.   REHAB POTENTIAL: Good  CLINICAL DECISION MAKING: Evolving/moderate complexity  EVALUATION COMPLEXITY: Moderate   GOALS: Goals reviewed with patient? Yes  SHORT TERM GOALS: Target date: 04/22/2023    Patient will stand in a neutral position without increased back pain Baseline: Goal status: MET - 04/21/23  2.  Increase gross bilateral lower extremity strength by 5 pounds Baseline: see above Goal status: MET - 04/21/23  3.  Patient will be independent with basic HEP on land and in water Baseline:  Goal status: MET -04/21/23  4.  Patient will report a 50% reduction in tenderness to palpation in bilateral paraspinal Baseline:  Goal status: MET -04/21/23   LONG TERM GOALS: Target date: 05/20/2023    Patient will  transfer sit to stand without use of hands in order to improve general functional mobility Baseline:  Goal status: IN PROGRESS - 04/21/23  2.  Patient will transfer out of bed without increased pain Baseline:  Goal status: IN PROGRESS -04/21/23  3.  Patient will ambulate community distances without increased pain Baseline:  Goal status: MET - 04/21/23  4.  Patient will return to water aerobics Baseline:  Goal status: INITIAL  5.  Patient will go up/down 8 steps without increased pain Baseline:  Goal status: IN PROGRESS - 04/21/23  PLAN:  PT FREQUENCY: 2x/week  PT DURATION: 8 weeks  PLANNED INTERVENTIONS: 97110-Therapeutic exercises, 97530- Therapeutic activity, 97112- Neuromuscular re-education, 97535- Self Care, [id number]- Manual therapy, [id number]- Gait training, [phone]- Aquatic Therapy, 97014- Electrical stimulation (unattended), 97035- Ultrasound, Patient/Family education, Stair training, Taping, Dry Needling, DME instructions, Cryotherapy, and Moist heat   PLAN FOR NEXT SESSION:  Begin soft tissue mobilization to paraspinals as tolerated.  Assess tolerance to HEP.  Continue to work on movements within pain-free ranges of the lumbar spine and shoulders.  Consider posterior chain exercises with weight rows and extensions if tolerated.  ER with yellow band.  Aquatics : postural training. standing endurance training. Work on sit to stands.stair training.    Mayer Camel, PTA 04/21/23 10:40 AM [facility] [address] [place], [place], [address] Phone: [phone]   Fax:  [phone]]

## 2023-04-24 ENCOUNTER — Ambulatory Visit (HOSPITAL_BASED_OUTPATIENT_CLINIC_OR_DEPARTMENT_OTHER): Attending: Neurosurgery | Admitting: Physical Therapy

## 2023-04-24 ENCOUNTER — Encounter (HOSPITAL_BASED_OUTPATIENT_CLINIC_OR_DEPARTMENT_OTHER): Payer: Self-pay | Admitting: Physical Therapy

## 2023-04-24 DIAGNOSIS — R293 Abnormal posture: Secondary | ICD-10-CM | POA: Insufficient documentation

## 2023-04-24 DIAGNOSIS — M6281 Muscle weakness (generalized): Secondary | ICD-10-CM | POA: Diagnosis not present

## 2023-04-24 DIAGNOSIS — R2689 Other abnormalities of gait and mobility: Secondary | ICD-10-CM | POA: Diagnosis not present

## 2023-04-24 DIAGNOSIS — M5459 Other low back pain: Secondary | ICD-10-CM | POA: Insufficient documentation

## 2023-04-24 DIAGNOSIS — M546 Pain in thoracic spine: Secondary | ICD-10-CM | POA: Insufficient documentation

## 2023-04-24 NOTE — Therapy (Signed)
 OUTPATIENT PHYSICAL THERAPY THORACOLUMBAR TREATMENT + PROGRESS NOTE   Patient Name: [name] MRN: [id number] DOB:[birth year], 83 y.o., female Today's Date: 04/24/2023  Progress Note Reporting Period 03/24/23 to 04/24/23  See note below for Objective Data and Assessment of Progress/Goals.     END OF SESSION:  PT End of Session - 04/24/23 0845     Visit Number 9    Number of Visits 16    Date for PT Re-Evaluation 05/20/23    Authorization Type [employer]    Authorization - Visit Number 9    Authorization - Number of Visits 12    Progress Note Due on Visit 19    PT Start Time 0846    PT Stop Time 0926    PT Time Calculation (min) 40 min    Activity Tolerance Patient tolerated treatment well               Past Medical History:  Diagnosis Date   Arthritis    Cancer (HCC)    skin  cancer Nose   COLONIC POLYPS, HX OF 10/31/2006   DIVERTICULOSIS, COLON 10/21/2008   ELEVATED BP READING WITHOUT DX HYPERTENSION 07/12/2009   HYPERLIPIDEMIA 07/01/2006   Hypertension    PALPITATIONS, OCCASIONAL 10/21/2008   VERTIGO 07/25/2009   Past Surgical History:  Procedure Laterality Date   COLONOSCOPY  02/24/2013   LAMINECTOMY     lumbar   ROTATOR CUFF REPAIR     VAGINAL HYSTERECTOMY     Patient Active Problem List   Diagnosis Date Noted   Closed traumatic compression fracture of thoracic vertebra (HCC), T11, T12  10/2022 04/15/2023   Acute bilateral thoracic back pain 11/14/2022   Muscle fatigue 05/28/2022   DOE (dyspnea on exertion) 09/13/2021   Subclinical hypothyroidism 07/04/2021   Rash and nonspecific skin eruption 07/21/2019   Pain in left knee 03/25/2019   Osteopenia 01/16/2019   Anxiety 12/01/2018   Hypertension 07/23/2018   Family history of diabetes mellitus (DM) 07/20/2018   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   Personal history of other malignant neoplasm of skin 03/05/2017   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   Inflammatory  arthritis - [facility] 03/21/2016   Vitamin D deficiency 02/06/2010   Diverticulosis of colon 10/21/2008   PALPITATIONS, OCCASIONAL 10/21/2008   History of colonic polyps 10/31/2006   Dyslipidemia 07/01/2006    PCP: Jake Church MD   REFERRING PROVIDER: Julio Sicks MD   REFERRING DIAG:  Diagnosis  S22.080A (ICD-10-CM) - Wedge compression fracture of T11-T12 vertebra, initial encounter for closed fracture    Rationale for Evaluation and Treatment: Rehabilitation  THERAPY DIAG:  Other low back pain  Other abnormalities of gait and mobility  ONSET DATE: Larey Seat in October of 2024   SUBJECTIVE:  SUBJECTIVE STATEMENT: 04/24/2023 Pt states she continues to improve but does have increased pain at times especially with sitting in car, L sided LBP. Would like to work on that a bit more, would like to work on balance more as well. Walking dog ~31min a day. Does better with movement, has done some independent aquatics. Feels she is progressing well with PT overall   Eval: Patient fell in October of 2024. She suffered a t-12 wedge fx. She has been to the MD who reports her fx is healing well. 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  Continues to have significant pain with any prolonged positioning.  She is having difficulty with sit to stand transfers.  She feels stiff in the morning.  She continues to have pain when she extends to neutral.  PERTINENT HISTORY:  Arthritis (hands) potentially in the neck, cancer, diverticulosis, vertigo, potentially has RA.   PAIN:  Are you having pain? no: NPRS scale: 0/10, 4/10 at worst mostly with prolonged sitting Pain location: Lower thoracic/ upper lumbar Pain description:  Aggravating factors: Any prolonged positioning Relieving factors: Changing  position  PRECAUTIONS: None per patient she no longer has lifting restrictions but is advised to keep reasonable  RED FLAGS: None   WEIGHT BEARING RESTRICTIONS: No  FALLS:  Has patient fallen in last 6 months? Yes. Number of falls 1 fall 2nd to her dog   LIVING ENVIRONMENT: 4 steps inside of her house. Has an upstairs as well . Has some difficulty with the right leg. Pain around the right trochanteric bursa   OCCUPATION: retired   Hobbies:  [employer] her dog Active in her church  Likes to Travel     PLOF: Independent  PATIENT GOALS: to have improved strangth and fnereral mobility    NEXT MD VISIT:   OBJECTIVE:  Note: Objective measures were completed at Evaluation unless otherwise noted.  DIAGNOSTIC FINDINGS:  X-rays:per patient fx is healing   PATIENT SURVEYS:  Modified Oswestry 18/50   04/21/23:  22% limited  COGNITION: Overall cognitive status: Within functional limits for tasks assessed     SENSATION: WFL    POSTURE: flexed trunk   PALPATION: Significant tenderness palpation from mid thoracic into the lower lumbar spine in her paraspinals  LUMBAR ROM:   AROM eval  Flexion Limited 50% not smooth movement. Pain coming back up  Extension Pain in neutral   Right lateral flexion   Left lateral flexion   Right rotation   Left rotation    (Blank rows = not tested)  L LOWER EXTREMITY MMT:    MMT Right eval Left eval Right  04/21/23 Left  04/21/23  Hip flexion 15.8 19.8 32.1 37.7  Hip extension      Hip abduction 10.7 18.2 25.5 30.3  Hip adduction      Hip internal rotation      Hip external rotation      Knee flexion      Knee extension 15.6 25.1 39.3 40.9  Ankle dorsiflexion      Ankle plantarflexion      Ankle inversion      Ankle eversion       (Blank rows = not tested)    GAIT:  04/24/23 - Stair assessment: 2x8 steps ; first lap w/o rail, CGA, reciprocal ascending step to descending, noted postural instability with  both ; second lap w/ rail, supervision, reciprocal up/down and is steady. No pain with either   TREATMENT DATE:  [facility] Adult PT Treatment:                                                DATE: 04/24/23 Neuromuscular re-ed: Slow karaokes along mat 3 laps each way cues for sequencing, posture, pacing Upside down cone taps x10 BIL cues for postural stability and motor control CGA w/ UE support PRN Tandem stance 2x30sec CGA (min sway R foot fwd, mod sway L foot fwd)  HEP update + education/handout emphasis on safe setup with more challenging exercises, use of counter/UE support   Therapeutic Activity: Stair assessment x2 + education on safety/improved mechanics STS practice no UE support x10 total Education/discussion re: progress with PT, symptom behavior as it affects activity tolerance, PT goals/POC     [facility] Adult PT Treatment:                                                DATE: 04/21/23 Pt seen for aquatic therapy today.  Treatment took place in water 3.5-4.75 ft in depth at the [facility] pool. Temp of water was 91.  Pt entered/exited the pool via stairs in step-to pattern with single rail.    MMT/ mod oswestry test  - unsupported walking forward/ backward with reciprocal arm swing - side stepping with arm addct/ abdct  with yellow hand floats - farmers carry with single and bilat yellow hand floats at side, marching forward / backward - hip hinge with UE on yellow hand floats, cues for avoiding arching thoracic area - TrA with hollow long noodle pull down to thighs x 10 wide stance then staggered stances - wall push up/off x 10 with core engaged - walk width of pool -> 10 more push offs  - staggered stance - kickboard row x 15 each LE forward - straddling yellow noodle, UE breast stroke arms: cycling - fig4 stretch at stairs x 10 sec each   [facility] Adult PT Treatment:                                                DATE: 04/14/23 Pt seen for aquatic therapy today.  Treatment  took place in water 3.5-4.75 ft in depth at the [facility] pool. Temp of water was 91.  Pt entered/exited the pool via stairs in step-to pattern with single rail.   - unsupported walking forward/ backward with reciprocal arm swing -> with light resistance bells, multiple laps - side stepping with arm addct/ abdct  with light resistance bells, 3 laps -Arm swing using green bells staggered stances then wide stance 2 sets: 5 slow then 5 fast - STS on 3rd step in water with forward arm reach x 6 - hip hinge x10 verbal and TC as well as demonstration. -step ups leading R/L->runners step ups -  forward runners step ups with light intermittent UE support on rail x 12 each LE - return to walking forward/ backward with arm swing  - TrA set with hollow long noodle pull down to thighs x 10 wide stance then staggered stances - wall push up/off x 10 with core engaged -  staggered stance - kickboard row x 15 each LE forward - straddling yellow noodle, UE breast stroke arms: cycling - fig4 stretch at stairs x 10 sec each     [facility] Adult PT Treatment:                                                DATE: 04/10/23 Manual Therapy: Seated STM to BIL QL and thoracolumbar paraspinals  Therapeutic Activity: STS 2x5 raised mat cues for mechanics Bosu TKE push 2x8 BIL for improved LE pushing, cues for reduced compensations, breath control  Standing 6 inch step taps w/ unilat UE support; 2x8 BIL with cues for reduced velocity, weight shifting, posture Time spent w/ education/discussion on mechanics for transfers, rationale for interventions, activity modification/pacing strategies      [facility] Adult PT Treatment:                                                DATE: 04/07/23 Pt seen for aquatic therapy today.  Treatment took place in water 3.5-4.75 ft in depth at the [facility] pool. Temp of water was 91.  Pt entered/exited the pool via stairs in step-to pattern with single rail.  - unsupported  walking forward/ backward with reciprocal arm swing -> with light resistance bells, multiple laps - side stepping with arm addct/ abdct  with light resistance bells, 3 laps - STS on 3rd step in water with forward arm reach x 6 -  forward runners step ups with light intermittent UE support on rail x 12 each LE - return to walking forward/ backward with arm swing  - wall push up/off x 10 with core engaged - TrA set with hollow long noodle pull down to thighs x 10 - staggered stance - kickboard row x 15 each LE forward - straddling yellow noodle, UE breast stroke arms: cycling - fig4 stretch at stairs x 10 sec each   [facility] Adult PT Treatment:                                                DATE: 04/02/23 Pt seen for aquatic therapy today.  Treatment took place in water 3.5-4.75 ft in depth at the [facility] pool. Temp of water was 91.  Pt entered/exited the pool via stairs in step-to pattern with single rail.  - unsupported walking forward/ backward with reciprocal arm swing, multiple laps - side stepping with arm addct/ abdct  with rainbow hand floats, 3 laps - farmer carry, walking forward/ backward with bilat rainbow -> single yellow hand float - seated on bench in water with feet on blue step: STS with forward arm reach x 10 -cues for neutral head, hinge at hip and controlled descent - alternating toe taps to 1st/2nd without support (easy) - forward step ups with light UE support on rail x 10 each LE - UE on wall: hip abdct/ addct 2 x 10 - return to walking forward/ backward with arm swing  - straddling yellow noodle, UE breast stroke arms: cycling - (reviewed verbally how to put dog leash on with hip hinge) -  fig4 stretch at stairs x 10 sec each   [facility] Adult PT Treatment:                                                DATE: 03/31/23 Pt seen for aquatic therapy today.  Treatment took place in water 3.5-4.75 ft in depth at the [facility] pool. Temp of water was 91.  Pt  entered/exited the pool via stairs in step-to pattern with single rail.  - Intro to aquatic therapy principles - unsupported walking forward/ backward with reciprocal arm swing, multiple laps - side stepping with arm addct/ abdct -> with rainbow hand floats - farmer carry, walking forward/ backward with bilat rainbow -> single yellow hand float - seated on bench in water with feet on blue step: STS with forward arm reach with hands on yellow hand floats x 5 -> without hand floats x 5, cues for neutral head, hinge at hip and controlled descent - straddling yellow noodle, UE breast stroke arms: cycling, suspended jumping jack LEs, cross country ski    Adult PT Treatment:                                                DATE: 3/6 Manual: skilled palpation of trigger points. Trigger point release to upper to low thoracic paraspinals in right side lying. Review of trigger points with exercises  There-ex: Ball roll 5x 5 sec Side to side stretch 5x 5 sec hold   Neuro-Re-ed  Row with TA breathing yellow 2x10  Shoulder extension with TA breathing 2x10 yellow                                                                                                                               Eval: Manual: Reviewed self soft tissue mobilization using Thera cane.  Reviewed where to purchase a pair of Thera cane.  Patient advised not to put pressure on this day on the muscle.    Exercises - Supine Lower Trunk Rotation  - 1 x daily - 7 x weekly - 3 sets - 10 reps - Theracane Over Shoulder  - 1 x daily - 7 x weekly - 3 sets - 10 reps - Seated Bilateral Shoulder Flexion Towel Slide at Table Top  - 1 x daily - 7 x weekly - 3 sets - 10 reps   PATIENT EDUCATION:  Education details: HEP, symptom management  Person educated: Patient Education method: Explanation, Demonstration, Tactile cues, and Verbal cues Education comprehension: verbalized understanding, returned demonstration, verbal cues required, tactile  cues required, and needs further education  HOME EXERCISE PROGRAM: Access Code: [email or web address] URL: [email or web address] Date: 04/24/2023 Prepared by: Fransisco Hertz  Program  Notes - for step taps, please use empty water bottle for increased control/balance  Exercises - Shoulder extension with resistance - Neutral  - 1 x daily - 7 x weekly - 3 sets - 10 reps - Scapular Retraction with Resistance  - 1 x daily - 7 x weekly - 3 sets - 10 reps - Step Taps with Unilateral Counter Support  - 1 x daily - 7 x weekly - 2 sets - 8 reps - Carioca with Counter Support  - 1 x daily - 7 x weekly - 2 sets - 8 reps - Standing Tandem Balance with Counter Support  - 1 x daily - 7 x weekly - 2 sets - 1-2 reps - 30sec hold   AQUATIC Access Code: [email or web address] URL: [email or web address] Date: 04/21/2023 Prepared by: [facility] This aquatic home exercise program from Med[employer] utilizes pictures from land based exercises, but has been adapted prior to lamination and issuance.    ASSESSMENT:  CLINICAL IMPRESSION: Pt arrives w/o pain, reports continued steady progress since starting PT in regards to overall pain and functional mobility. We look at LTG today, able to perform STS for multiple repetitions and no pain although does require inc time/effort. She also does relatively well with stair navigation, with CGA and no rail she is able to ascend reciprocally (although is unsteady) and descend step to (also a bit unsteady). Unsteadiness resolves with rail use. In discussion w/ pt, recommend continuing along current POC with emphasis on balance, posture, and functional mobility - she verbalizes agreement/understanding with this plan. Land HEP update as above to address static and dynamic postural stability, education on safe strategies to implement at home. No adverse events or increase in pain. Pt departs today's session in no acute distress, all voiced  questions/concerns addressed appropriately from PT perspective.    From initial evaluation:  Patient is a 83 year old female status post fall leading to T12 wedge compression fracture.  Per MD she is lost 60% of her spinal segment height.  The patient significant pain with prolonged positioning.  She has difficulty with sit to stand transfers.  She is previously very active.  She previously went to water aerobics 3-4 times a week.  She presents with lower extremity weakness, decreased spinal mobility, decrease general functional mobility.  She would benefit from skilled therapy to return to active lifestyle.  OBJECTIVE IMPAIRMENTS: Abnormal gait, decreased activity tolerance, difficulty walking, decreased ROM, decreased strength, increased muscle spasms, postural dysfunction, and pain.   ACTIVITY LIMITATIONS: carrying, lifting, bending, sitting, standing, sleeping, stairs, transfers, and locomotion level  PARTICIPATION LIMITATIONS: meal prep, cleaning, laundry, driving, shopping, community activity, and yard work  PERSONAL FACTORS: Age and Time since onset of injury/illness/exacerbation are also affecting patient's functional outcome.   REHAB POTENTIAL: Good  CLINICAL DECISION MAKING: Evolving/moderate complexity  EVALUATION COMPLEXITY: Moderate   GOALS: Goals reviewed with patient? Yes  SHORT TERM GOALS: Target date: 04/22/2023    Patient will stand in a neutral position without increased back pain Baseline: Goal status: MET - 04/21/23  2.  Increase gross bilateral lower extremity strength by 5 pounds Baseline: see above Goal status: MET - 04/21/23  3.  Patient will be independent with basic HEP on land and in water Baseline:  Goal status: MET -04/21/23  4.  Patient will report a 50% reduction in tenderness to palpation in bilateral paraspinal Baseline:  Goal status: MET -04/21/23   LONG TERM GOALS: Target date: 05/20/2023    Patient will transfer  sit to stand without use of  hands in order to improve general functional mobility Baseline:  Goal status: MET - 04/24/23 able to perform multiple reps without UE support or pain   2.  Patient will transfer out of bed without increased pain Baseline:  Goal status: improving, still some inc time/effort per pt report 04/24/23  3.  Patient will ambulate community distances without increased pain Baseline:  Goal status: MET - 04/21/23  4.  Patient will return to water aerobics Baseline:  Goal status: PROGRESSING - 04/24/23 starting to do independent aquatics   5.  Patient will go up/down 8 steps without increased pain Baseline:  Goal status: PARTIALLY MET 04/24/23 - able to perform without pain but does have some postural instability  PLAN:  PT FREQUENCY: 2x/week  PT DURATION: 8 weeks  PLANNED INTERVENTIONS: 97110-Therapeutic exercises, 97530- Therapeutic activity, 97112- Neuromuscular re-education, 97535- Self Care, 14782- Manual therapy, [id number]- Gait training, [id number]- Aquatic Therapy, 97014- Electrical stimulation (unattended), 97035- Ultrasound, Patient/Family education, Stair training, Taping, Dry Needling, DME instructions, Cryotherapy, and Moist heat   PLAN FOR NEXT SESSION:  Land: work on postural stability, functional mobility within pt tolerance  Aquatics : postural training. standing endurance training. Work on sit to stands.stair training.    Ashley Murrain PT, DPT 04/24/2023 11:36 AM  [facility] [address] [place], [place], [address] Phone: [phone]   Fax:  [phone]]

## 2023-04-25 DIAGNOSIS — Z1231 Encounter for screening mammogram for malignant neoplasm of breast: Secondary | ICD-10-CM | POA: Diagnosis not present

## 2023-04-25 DIAGNOSIS — M85851 Other specified disorders of bone density and structure, right thigh: Secondary | ICD-10-CM | POA: Diagnosis not present

## 2023-04-25 LAB — HM DEXA SCAN

## 2023-04-25 LAB — HM MAMMOGRAPHY

## 2023-04-28 ENCOUNTER — Encounter: Payer: Self-pay | Admitting: Internal Medicine

## 2023-04-28 ENCOUNTER — Encounter (HOSPITAL_BASED_OUTPATIENT_CLINIC_OR_DEPARTMENT_OTHER): Payer: Self-pay | Admitting: Physical Therapy

## 2023-04-28 ENCOUNTER — Ambulatory Visit (HOSPITAL_BASED_OUTPATIENT_CLINIC_OR_DEPARTMENT_OTHER): Admitting: Physical Therapy

## 2023-04-28 DIAGNOSIS — R293 Abnormal posture: Secondary | ICD-10-CM | POA: Diagnosis not present

## 2023-04-28 DIAGNOSIS — R2689 Other abnormalities of gait and mobility: Secondary | ICD-10-CM

## 2023-04-28 DIAGNOSIS — M6281 Muscle weakness (generalized): Secondary | ICD-10-CM | POA: Diagnosis not present

## 2023-04-28 DIAGNOSIS — M546 Pain in thoracic spine: Secondary | ICD-10-CM

## 2023-04-28 DIAGNOSIS — M5459 Other low back pain: Secondary | ICD-10-CM | POA: Diagnosis not present

## 2023-04-28 NOTE — Therapy (Signed)
 OUTPATIENT PHYSICAL THERAPY THORACOLUMBAR TREATMENT    Patient Name: [name] MRN: [id number] DOB:1940/11/30, 83 y.o., female Today's Date: 04/28/2023   END OF SESSION:  PT End of Session - 04/28/23 0937     Visit Number 10    Number of Visits 16    Date for PT Re-Evaluation 05/20/23    Authorization Type [employer]    Authorization - Visit Number 10    Authorization - Number of Visits 12    Progress Note Due on Visit 19    PT Start Time 0928    PT Stop Time 1010    PT Time Calculation (min) 42 min    Behavior During Therapy WFL for tasks assessed/performed               Past Medical History:  Diagnosis Date   Arthritis    Cancer (HCC)    skin  cancer Nose   COLONIC POLYPS, HX OF 10/31/2006   DIVERTICULOSIS, COLON 10/21/2008   ELEVATED BP READING WITHOUT DX HYPERTENSION 07/12/2009   HYPERLIPIDEMIA 07/01/2006   Hypertension    PALPITATIONS, OCCASIONAL 10/21/2008   VERTIGO 07/25/2009   Past Surgical History:  Procedure Laterality Date   COLONOSCOPY  02/24/2013   LAMINECTOMY     lumbar   ROTATOR CUFF REPAIR     VAGINAL HYSTERECTOMY     Patient Active Problem List   Diagnosis Date Noted   Closed traumatic compression fracture of thoracic vertebra (HCC), T11, T12  10/2022 04/15/2023   Acute bilateral thoracic back pain 11/14/2022   Muscle fatigue 05/28/2022   DOE (dyspnea on exertion) 09/13/2021   Subclinical hypothyroidism 07/04/2021   Rash and nonspecific skin eruption 07/21/2019   Pain in left knee 03/25/2019   Osteopenia 01/16/2019   Anxiety 12/01/2018   Hypertension 07/23/2018   Family history of diabetes mellitus (DM) 07/20/2018   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   Personal history of other malignant neoplasm of skin 03/05/2017   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   Inflammatory arthritis - [facility] 03/21/2016   Vitamin D deficiency 02/06/2010   Diverticulosis of colon 10/21/2008   PALPITATIONS, OCCASIONAL  10/21/2008   History of colonic polyps 10/31/2006   Dyslipidemia 07/01/2006    PCP: Jake Church MD   REFERRING PROVIDER: Julio Sicks MD   REFERRING DIAG:  Diagnosis  S22.080A (ICD-10-CM) - Wedge compression fracture of T11-T12 vertebra, initial encounter for closed fracture    Rationale for Evaluation and Treatment: Rehabilitation  THERAPY DIAG:  Other low back pain  Other abnormalities of gait and mobility  Pain in thoracic spine  Abnormal posture  ONSET DATE: Larey Seat in October of 2024   SUBJECTIVE:  SUBJECTIVE STATEMENT: 04/28/2023 Pt reports soreness in back after completing band exercises (bilat shoulder ext and row). Pain lasted all day after completing exercise.  "Today I got out of bed real well. "    Eval: Patient fell in October of 2024. She suffered a t-12 wedge fx. She has been to the MD who reports her fx is healing well. 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  Continues to have significant pain with any prolonged positioning.  She is having difficulty with sit to stand transfers.  She feels stiff in the morning.  She continues to have pain when she extends to neutral.  PERTINENT HISTORY:  Arthritis (hands) potentially in the neck, cancer, diverticulosis, vertigo, potentially has RA.   PAIN:  Are you having pain? yes: NPRS scale: 1/10,  Pain location: Lower thoracic/ upper lumbar Pain description:  Aggravating factors: Any prolonged positioning Relieving factors: Changing position  PRECAUTIONS: None per patient she no longer has lifting restrictions but is advised to keep reasonable  RED FLAGS: None   WEIGHT BEARING RESTRICTIONS: No  FALLS:  Has patient fallen in last 6 months? Yes. Number of falls 1 fall 2nd to her dog   LIVING ENVIRONMENT: 4 steps  inside of her house. Has an upstairs as well . Has some difficulty with the right leg. Pain around the right trochanteric bursa   OCCUPATION: retired   Hobbies:  [employer] her dog Active in her church  Likes to Travel     PLOF: Independent  PATIENT GOALS: to have improved strangth and fnereral mobility    NEXT MD VISIT:   OBJECTIVE:  Note: Objective measures were completed at Evaluation unless otherwise noted.  DIAGNOSTIC FINDINGS:  X-rays:per patient fx is healing   PATIENT SURVEYS:  Modified Oswestry 18/50   04/21/23:  22% limited  COGNITION: Overall cognitive status: Within functional limits for tasks assessed     SENSATION: WFL    POSTURE: flexed trunk   PALPATION: Significant tenderness palpation from mid thoracic into the lower lumbar spine in her paraspinals  LUMBAR ROM:   AROM eval  Flexion Limited 50% not smooth movement. Pain coming back up  Extension Pain in neutral   Right lateral flexion   Left lateral flexion   Right rotation   Left rotation    (Blank rows = not tested)  L LOWER EXTREMITY MMT:    MMT Right eval Left eval Right  04/21/23 Left  04/21/23  Hip flexion 15.8 19.8 32.1 37.7  Hip extension      Hip abduction 10.7 18.2 25.5 30.3  Hip adduction      Hip internal rotation      Hip external rotation      Knee flexion      Knee extension 15.6 25.1 39.3 40.9  Ankle dorsiflexion      Ankle plantarflexion      Ankle inversion      Ankle eversion       (Blank rows = not tested)    GAIT:  04/24/23 - Stair assessment: 2x8 steps ; first lap w/o rail, CGA, reciprocal ascending step to descending, noted postural instability with both ; second lap w/ rail, supervision, reciprocal up/down and is steady. No pain with either   TREATMENT DATE:  [facility] Adult PT Treatment:  DATE: 04/28/23 Pt seen for aquatic therapy today.  Treatment took place in water 3.5-4.75 ft in depth at  the [facility] pool. Temp of water was 91.  Pt entered/exited the pool via stairs in step-to pattern with single rail.   -with strider band:  resisted row and resisted bilat shoulder ext with cues for form and posture x 10 each - straddling yellow noodle, UE breast stroke arms: cycling -braiding R/L with/without squat when crossing leg over; cues for upright posture - farmers carry with single and bilat yellow hand floats at side, marching forward / backward -staggered stance - kickboard row x2 x 10 each LE forward - wall push up/off 2 x 10 with core engaged - hip hinge with UE on yellow hand floats, cues for avoiding arching thoracic area - TrA with hollow long noodle pull down to thighs 2 x 10 wide stance  - runner's step ups without UE support x 10 each LE - noodle stomp x 10 each LE  - fig4 stretch at stairs x 10 sec each  [facility] Adult PT Treatment:                                                DATE: 04/24/23 Neuromuscular re-ed: Slow karaokes along mat 3 laps each way cues for sequencing, posture, pacing Upside down cone taps x10 BIL cues for postural stability and motor control CGA w/ UE support PRN Tandem stance 2x30sec CGA (min sway R foot fwd, mod sway L foot fwd)  HEP update + education/handout emphasis on safe setup with more challenging exercises, use of counter/UE support   Therapeutic Activity: Stair assessment x2 + education on safety/improved mechanics STS practice no UE support x10 total Education/discussion re: progress with PT, symptom behavior as it affects activity tolerance, PT goals/POC     [facility] Adult PT Treatment:                                                DATE: 04/21/23 Pt seen for aquatic therapy today.  Treatment took place in water 3.5-4.75 ft in depth at the [facility] pool. Temp of water was 91.  Pt entered/exited the pool via stairs in step-to pattern with single rail.    MMT/ mod oswestry test  - unsupported walking forward/  backward with reciprocal arm swing - side stepping with arm addct/ abdct  with yellow hand floats - farmers carry with single and bilat yellow hand floats at side, marching forward / backward - hip hinge with UE on yellow hand floats, cues for avoiding arching thoracic area - TrA with hollow long noodle pull down to thighs x 10 wide stance then staggered stances - wall push up/off x 10 with core engaged - walk width of pool -> 10 more push offs  - staggered stance - kickboard row x 15 each LE forward - straddling yellow noodle, UE breast stroke arms: cycling - fig4 stretch at stairs x 10 sec each   [facility] Adult PT Treatment:  DATE: 04/14/23 Pt seen for aquatic therapy today.  Treatment took place in water 3.5-4.75 ft in depth at the [facility] pool. Temp of water was 91.  Pt entered/exited the pool via stairs in step-to pattern with single rail.   - unsupported walking forward/ backward with reciprocal arm swing -> with light resistance bells, multiple laps - side stepping with arm addct/ abdct  with light resistance bells, 3 laps -Arm swing using green bells staggered stances then wide stance 2 sets: 5 slow then 5 fast - STS on 3rd step in water with forward arm reach x 6 - hip hinge x10 verbal and TC as well as demonstration. -step ups leading R/L->runners step ups -  forward runners step ups with light intermittent UE support on rail x 12 each LE - return to walking forward/ backward with arm swing  - TrA set with hollow long noodle pull down to thighs x 10 wide stance then staggered stances - wall push up/off x 10 with core engaged - staggered stance - kickboard row x 15 each LE forward - straddling yellow noodle, UE breast stroke arms: cycling - fig4 stretch at stairs x 10 sec each     [facility] Adult PT Treatment:                                                DATE: 04/10/23 Manual Therapy: Seated STM to BIL QL and thoracolumbar  paraspinals  Therapeutic Activity: STS 2x5 raised mat cues for mechanics Bosu TKE push 2x8 BIL for improved LE pushing, cues for reduced compensations, breath control  Standing 6 inch step taps w/ unilat UE support; 2x8 BIL with cues for reduced velocity, weight shifting, posture Time spent w/ education/discussion on mechanics for transfers, rationale for interventions, activity modification/pacing strategies      [facility] Adult PT Treatment:                                                DATE: 04/07/23 Pt seen for aquatic therapy today.  Treatment took place in water 3.5-4.75 ft in depth at the [facility] pool. Temp of water was 91.  Pt entered/exited the pool via stairs in step-to pattern with single rail.  - unsupported walking forward/ backward with reciprocal arm swing -> with light resistance bells, multiple laps - side stepping with arm addct/ abdct  with light resistance bells, 3 laps - STS on 3rd step in water with forward arm reach x 6 -  forward runners step ups with light intermittent UE support on rail x 12 each LE - return to walking forward/ backward with arm swing  - wall push up/off x 10 with core engaged - TrA set with hollow long noodle pull down to thighs x 10 - staggered stance - kickboard row x 15 each LE forward - straddling yellow noodle, UE breast stroke arms: cycling - fig4 stretch at stairs x 10 sec each   [facility] Adult PT Treatment:  DATE: 04/02/23 Pt seen for aquatic therapy today.  Treatment took place in water 3.5-4.75 ft in depth at the [facility] pool. Temp of water was 91.  Pt entered/exited the pool via stairs in step-to pattern with single rail.  - unsupported walking forward/ backward with reciprocal arm swing, multiple laps - side stepping with arm addct/ abdct  with rainbow hand floats, 3 laps - farmer carry, walking forward/ backward with bilat rainbow -> single yellow hand float - seated  on bench in water with feet on blue step: STS with forward arm reach x 10 -cues for neutral head, hinge at hip and controlled descent - alternating toe taps to 1st/2nd without support (easy) - forward step ups with light UE support on rail x 10 each LE - UE on wall: hip abdct/ addct 2 x 10 - return to walking forward/ backward with arm swing  - straddling yellow noodle, UE breast stroke arms: cycling - (reviewed verbally how to put dog leash on with hip hinge) - fig4 stretch at stairs x 10 sec each   [facility] Adult PT Treatment:                                                DATE: 03/31/23 Pt seen for aquatic therapy today.  Treatment took place in water 3.5-4.75 ft in depth at the [facility] pool. Temp of water was 91.  Pt entered/exited the pool via stairs in step-to pattern with single rail.  - Intro to aquatic therapy principles - unsupported walking forward/ backward with reciprocal arm swing, multiple laps - side stepping with arm addct/ abdct -> with rainbow hand floats - farmer carry, walking forward/ backward with bilat rainbow -> single yellow hand float - seated on bench in water with feet on blue step: STS with forward arm reach with hands on yellow hand floats x 5 -> without hand floats x 5, cues for neutral head, hinge at hip and controlled descent - straddling yellow noodle, UE breast stroke arms: cycling, suspended jumping jack LEs, cross country ski    Adult PT Treatment:                                                DATE: 3/6 Manual: skilled palpation of trigger points. Trigger point release to upper to low thoracic paraspinals in right side lying. Review of trigger points with exercises  There-ex: Ball roll 5x 5 sec Side to side stretch 5x 5 sec hold   Neuro-Re-ed  Row with TA breathing yellow 2x10  Shoulder extension with TA breathing 2x10 yellow  Eval: Manual: Reviewed self soft tissue mobilization using Thera cane.  Reviewed where to purchase a pair of Thera cane.  Patient advised not to put pressure on this day on the muscle.    Exercises - Supine Lower Trunk Rotation  - 1 x daily - 7 x weekly - 3 sets - 10 reps - Theracane Over Shoulder  - 1 x daily - 7 x weekly - 3 sets - 10 reps - Seated Bilateral Shoulder Flexion Towel Slide at Table Top  - 1 x daily - 7 x weekly - 3 sets - 10 reps   PATIENT EDUCATION:  Education details: HEP, symptom management  Person educated: Patient Education method: Explanation, Demonstration, Tactile cues, and Verbal cues Education comprehension: verbalized understanding, returned demonstration, verbal cues required, tactile cues required, and needs further education  HOME EXERCISE PROGRAM: Access Code: [email or web address] URL: [email or web address] Date: 04/24/2023 Prepared by: Fransisco Hertz  Program Notes - for step taps, please use empty water bottle for increased control/balance  Exercises - Shoulder extension with resistance - Neutral  - 1 x daily - 7 x weekly - 3 sets - 10 reps - Scapular Retraction with Resistance  - 1 x daily - 7 x weekly - 3 sets - 10 reps - Step Taps with Unilateral Counter Support  - 1 x daily - 7 x weekly - 2 sets - 8 reps - Carioca with Counter Support  - 1 x daily - 7 x weekly - 2 sets - 8 reps - Standing Tandem Balance with Counter Support  - 1 x daily - 7 x weekly - 2 sets - 1-2 reps - 30sec hold   AQUATIC Access Code: [email or web address] URL: [email or web address] Date: 04/21/2023 Prepared by: [facility] [address] This aquatic home exercise program from [employer] utilizes pictures from land based exercises, but has been adapted prior to lamination and issuance.    ASSESSMENT:  CLINICAL IMPRESSION: Reviewed aquatic HEP (issued previous session) and answered any questions. No adverse events or increase in pain. Pt  to continue land based therapy moving forward.  Pt has met max potential in aquatic setting.     From initial evaluation:  Patient is a 83 year old female status post fall leading to T12 wedge compression fracture.  Per MD she is lost 60% of her spinal segment height.  The patient significant pain with prolonged positioning.  She has difficulty with sit to stand transfers.  She is previously very active.  She previously went to water aerobics 3-4 times a week.  She presents with lower extremity weakness, decreased spinal mobility, decrease general functional mobility.  She would benefit from skilled therapy to return to active lifestyle.  OBJECTIVE IMPAIRMENTS: Abnormal gait, decreased activity tolerance, difficulty walking, decreased ROM, decreased strength, increased muscle spasms, postural dysfunction, and pain.   ACTIVITY LIMITATIONS: carrying, lifting, bending, sitting, standing, sleeping, stairs, transfers, and locomotion level  PARTICIPATION LIMITATIONS: meal prep, cleaning, laundry, driving, shopping, community activity, and yard work  PERSONAL FACTORS: Age and Time since onset of injury/illness/exacerbation are also affecting patient's functional outcome.   REHAB POTENTIAL: Good  CLINICAL DECISION MAKING: Evolving/moderate complexity  EVALUATION COMPLEXITY: Moderate   GOALS: Goals reviewed with patient? Yes  SHORT TERM GOALS: Target date: 04/22/2023    Patient will stand in a neutral position without increased back pain Baseline: Goal status: MET - 04/21/23  2.  Increase gross bilateral lower extremity strength by 5 pounds Baseline: see above Goal status: MET - 04/21/23  3.  Patient will be  independent with basic HEP on land and in water Baseline:  Goal status: MET -04/21/23  4.  Patient will report a 50% reduction in tenderness to palpation in bilateral paraspinal Baseline:  Goal status: MET -04/21/23   LONG TERM GOALS: Target date: 05/20/2023    Patient will  transfer sit to stand without use of hands in order to improve general functional mobility Baseline:  Goal status: MET - 04/24/23 able to perform multiple reps without UE support or pain   2.  Patient will transfer out of bed without increased pain Baseline:  Goal status: improving, still some inc time/effort per pt report 04/24/23  3.  Patient will ambulate community distances without increased pain Baseline:  Goal status: MET - 04/21/23  4.  Patient will return to water aerobics Baseline:  Goal status: PROGRESSING - 04/24/23 starting to do independent aquatics   5.  Patient will go up/down 8 steps without increased pain Baseline:  Goal status: PARTIALLY MET 04/24/23 - able to perform without pain but does have some postural instability  PLAN:  PT FREQUENCY: 2x/week  PT DURATION: 8 weeks  PLANNED INTERVENTIONS: 97110-Therapeutic exercises, 97530- Therapeutic activity, 97112- Neuromuscular re-education, 97535- Self Care, 16109- Manual therapy, [phone]- Gait training, [phone]- Aquatic Therapy, 97014- Electrical stimulation (unattended), 97035- Ultrasound, Patient/Family education, Stair training, Taping, Dry Needling, DME instructions, Cryotherapy, and Moist heat   PLAN FOR NEXT SESSION:  Land: work on postural stability, functional mobility within pt tolerance  (see progress note)  Mayer Camel, PTA 04/28/23 10:10 AM [facility] [address] [place], [place], [address] Phone: [phone]   Fax:  [phone]

## 2023-04-30 ENCOUNTER — Other Ambulatory Visit: Payer: Self-pay

## 2023-04-30 DIAGNOSIS — M810 Age-related osteoporosis without current pathological fracture: Secondary | ICD-10-CM

## 2023-04-30 MED ORDER — DENOSUMAB 60 MG/ML ~~LOC~~ SOSY
60.0000 mg | PREFILLED_SYRINGE | Freq: Once | SUBCUTANEOUS | Status: AC
  Administered 2023-05-21: 60 mg via SUBCUTANEOUS

## 2023-05-01 ENCOUNTER — Ambulatory Visit (HOSPITAL_BASED_OUTPATIENT_CLINIC_OR_DEPARTMENT_OTHER): Admitting: Physical Therapy

## 2023-05-01 DIAGNOSIS — R293 Abnormal posture: Secondary | ICD-10-CM | POA: Diagnosis not present

## 2023-05-01 DIAGNOSIS — M6281 Muscle weakness (generalized): Secondary | ICD-10-CM

## 2023-05-01 DIAGNOSIS — M5459 Other low back pain: Secondary | ICD-10-CM | POA: Diagnosis not present

## 2023-05-01 DIAGNOSIS — R2689 Other abnormalities of gait and mobility: Secondary | ICD-10-CM | POA: Diagnosis not present

## 2023-05-01 DIAGNOSIS — M546 Pain in thoracic spine: Secondary | ICD-10-CM | POA: Diagnosis not present

## 2023-05-01 NOTE — Therapy (Signed)
 OUTPATIENT PHYSICAL THERAPY THORACOLUMBAR TREATMENT    Patient Name: [name] MRN: [id number] DOB:02-15-1940, 83 y.o., female Today's Date: 05/01/2023   END OF SESSION:  PT End of Session - 05/01/23 0852     Visit Number 11    Number of Visits 16    Date for PT Re-Evaluation 05/20/23    Authorization Type humana medicare    Authorization - Visit Number 11    Authorization - Number of Visits 12    Progress Note Due on Visit 19    PT Start Time [phone]    PT Stop Time 0930    PT Time Calculation (min) 38 min    Behavior During Therapy WFL for tasks assessed/performed                Past Medical History:  Diagnosis Date   Arthritis    Cancer (HCC)    skin  cancer Nose   COLONIC POLYPS, HX OF 10/31/2006   DIVERTICULOSIS, COLON 10/21/2008   ELEVATED BP READING WITHOUT DX HYPERTENSION 07/12/2009   HYPERLIPIDEMIA 07/01/2006   Hypertension    PALPITATIONS, OCCASIONAL 10/21/2008   VERTIGO 07/25/2009   Past Surgical History:  Procedure Laterality Date   COLONOSCOPY  02/24/2013   LAMINECTOMY     lumbar   ROTATOR CUFF REPAIR     VAGINAL HYSTERECTOMY     Patient Active Problem List   Diagnosis Date Noted   Closed traumatic compression fracture of thoracic vertebra (HCC), T11, T12  10/2022 04/15/2023   Acute bilateral thoracic back pain 11/14/2022   Muscle fatigue 05/28/2022   DOE (dyspnea on exertion) 09/13/2021   Subclinical hypothyroidism 07/04/2021   Rash and nonspecific skin eruption 07/21/2019   Pain in left knee 03/25/2019   Osteoporosis 01/16/2019   Anxiety 12/01/2018   Hypertension 07/23/2018   Family history of diabetes mellitus (DM) 07/20/2018   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   Personal history of other malignant neoplasm of skin 03/05/2017   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   Inflammatory arthritis - [facility] 03/21/2016   Vitamin D deficiency 02/06/2010   Diverticulosis of colon 10/21/2008   PALPITATIONS,  OCCASIONAL 10/21/2008   History of colonic polyps 10/31/2006   Dyslipidemia 07/01/2006    PCP: Jake Church MD   REFERRING PROVIDER: Julio Sicks MD   REFERRING DIAG:  Diagnosis  S22.080A (ICD-10-CM) - Wedge compression fracture of T11-T12 vertebra, initial encounter for closed fracture    Rationale for Evaluation and Treatment: Rehabilitation  THERAPY DIAG:  No diagnosis found.  ONSET DATE: Larey Seat in October of 2024   SUBJECTIVE:  SUBJECTIVE STATEMENT: 05/01/2023 Pt states she always feels a little stiff this morning. Pt states she will be doing water walking after PT today. Pt does feel like she is ready to d/c from PT for now and feels she has a good HEP.    Eval: Patient fell in October of 2024. She suffered a t-12 wedge fx. She has been to the MD who reports her fx is healing well. 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  Continues to have significant pain with any prolonged positioning.  She is having difficulty with sit to stand transfers.  She feels stiff in the morning.  She continues to have pain when she extends to neutral.  PERTINENT HISTORY:  Arthritis (hands) potentially in the neck, cancer, diverticulosis, vertigo, potentially has RA.   PAIN:  Are you having pain? yes: NPRS scale: 1/10,  Pain location: Lower thoracic/ upper lumbar Pain description:  Aggravating factors: Any prolonged positioning Relieving factors: Changing position  PRECAUTIONS: None per patient she no longer has lifting restrictions but is advised to keep reasonable  RED FLAGS: None   WEIGHT BEARING RESTRICTIONS: No  FALLS:  Has patient fallen in last 6 months? Yes. Number of falls 1 fall 2nd to her dog   LIVING ENVIRONMENT: 4 steps inside of her house. Has an upstairs as well . Has some  difficulty with the right leg. Pain around the right trochanteric bursa   OCCUPATION: retired   Hobbies:  [employer] her dog Active in her church  Likes to Travel     PLOF: Independent  PATIENT GOALS: to have improved strangth and fnereral mobility    NEXT MD VISIT:   OBJECTIVE:  Note: Objective measures were completed at Evaluation unless otherwise noted.  DIAGNOSTIC FINDINGS:  X-rays:per patient fx is healing   PATIENT SURVEYS:  Modified Oswestry 18/50   04/21/23:  22% limited  COGNITION: Overall cognitive status: Within functional limits for tasks assessed     SENSATION: WFL  POSTURE: flexed trunk   PALPATION: Significant tenderness palpation from mid thoracic into the lower lumbar spine in her paraspinals  LUMBAR ROM:   AROM eval  Flexion Limited 50% not smooth movement. Pain coming back up  Extension Pain in neutral   Right lateral flexion   Left lateral flexion   Right rotation   Left rotation    (Blank rows = not tested)   LOWER EXTREMITY MMT:    MMT Right eval Left eval Right  04/21/23 Left  04/21/23 Right 05/01/23 Left 05/01/23  Hip flexion 15.8 19.8 32.1 37.7 30.8 35.6  Hip extension        Hip abduction 10.7 18.2 25.5 30.3 27.1 32.9  Hip adduction        Hip internal rotation        Hip external rotation        Knee flexion        Knee extension 15.6 25.1 39.3 40.9    Ankle dorsiflexion        Ankle plantarflexion        Ankle inversion        Ankle eversion         (Blank rows = not tested)    GAIT: 04/24/23 - Stair assessment: 2x8 steps ; first lap w/o rail, CGA, reciprocal ascending step to descending, noted postural instability with both ; second lap w/ rail, supervision, reciprocal up/down and is steady. No pain with either  05/01/23 - Stair assessment:  TREATMENT DATE:  [facility] Adult PT Treatment:                                                DATE: 05/01/23 Therapeutic Exercise: Nustep L5 x 8 min  UEs/LEs Sitting pball roll outs x10 flexion and then lateral flexion Rechecked strength and LTGs Therapeutic Activity: Shoulder ext 10# cables x10 Tandem stance with head turns x10 Tandem stance with head nods Scap squeeze 10# cables x10 Self Care: Continuing HEP, goals, continued exercises after d/c, new referral if needed for balance   [facility] Adult PT Treatment:                                                DATE: 04/28/23 Pt seen for aquatic therapy today.  Treatment took place in water 3.5-4.75 ft in depth at the [facility] pool. Temp of water was 91.  Pt entered/exited the pool via stairs in step-to pattern with single rail.   -with strider band:  resisted row and resisted bilat shoulder ext with cues for form and posture x 10 each - straddling yellow noodle, UE breast stroke arms: cycling -braiding R/L with/without squat when crossing leg over; cues for upright posture - farmers carry with single and bilat yellow hand floats at side, marching forward / backward -staggered stance - kickboard row x2 x 10 each LE forward - wall push up/off 2 x 10 with core engaged - hip hinge with UE on yellow hand floats, cues for avoiding arching thoracic area - TrA with hollow long noodle pull down to thighs 2 x 10 wide stance  - runner's step ups without UE support x 10 each LE - noodle stomp x 10 each LE  - fig4 stretch at stairs x 10 sec each  [facility] Adult PT Treatment:                                                DATE: 04/24/23 Neuromuscular re-ed: Slow karaokes along mat 3 laps each way cues for sequencing, posture, pacing Upside down cone taps x10 BIL cues for postural stability and motor control CGA w/ UE support PRN Tandem stance 2x30sec CGA (min sway R foot fwd, mod sway L foot fwd)  HEP update + education/handout emphasis on safe setup with more challenging exercises, use of counter/UE support   Therapeutic Activity: Stair assessment x2 + education on safety/improved  mechanics STS practice no UE support x10 total Education/discussion re: progress with PT, symptom behavior as it affects activity tolerance, PT goals/POC     [facility] Adult PT Treatment:                                                DATE: 04/21/23 Pt seen for aquatic therapy today.  Treatment took place in water 3.5-4.75 ft in depth at the [facility] pool. Temp of water was 91.  Pt entered/exited the pool via stairs in step-to pattern with single rail.  MMT/ mod oswestry test  - unsupported walking forward/ backward with reciprocal arm swing - side stepping with arm addct/ abdct  with yellow hand floats - farmers carry with single and bilat yellow hand floats at side, marching forward / backward - hip hinge with UE on yellow hand floats, cues for avoiding arching thoracic area - TrA with hollow long noodle pull down to thighs x 10 wide stance then staggered stances - wall push up/off x 10 with core engaged - walk width of pool -> 10 more push offs  - staggered stance - kickboard row x 15 each LE forward - straddling yellow noodle, UE breast stroke arms: cycling - fig4 stretch at stairs x 10 sec each   [facility] Adult PT Treatment:                                                DATE: 04/14/23 Pt seen for aquatic therapy today.  Treatment took place in water 3.5-4.75 ft in depth at the [facility] pool. Temp of water was 91.  Pt entered/exited the pool via stairs in step-to pattern with single rail.   - unsupported walking forward/ backward with reciprocal arm swing -> with light resistance bells, multiple laps - side stepping with arm addct/ abdct  with light resistance bells, 3 laps -Arm swing using green bells staggered stances then wide stance 2 sets: 5 slow then 5 fast - STS on 3rd step in water with forward arm reach x 6 - hip hinge x10 verbal and TC as well as demonstration. -step ups leading R/L->runners step ups -  forward runners step ups with light intermittent  UE support on rail x 12 each LE - return to walking forward/ backward with arm swing  - TrA set with hollow long noodle pull down to thighs x 10 wide stance then staggered stances - wall push up/off x 10 with core engaged - staggered stance - kickboard row x 15 each LE forward - straddling yellow noodle, UE breast stroke arms: cycling - fig4 stretch at stairs x 10 sec each     [facility] Adult PT Treatment:                                                DATE: 04/10/23 Manual Therapy: Seated STM to BIL QL and thoracolumbar paraspinals  Therapeutic Activity: STS 2x5 raised mat cues for mechanics Bosu TKE push 2x8 BIL for improved LE pushing, cues for reduced compensations, breath control  Standing 6 inch step taps w/ unilat UE support; 2x8 BIL with cues for reduced velocity, weight shifting, posture Time spent w/ education/discussion on mechanics for transfers, rationale for interventions, activity modification/pacing strategies       PATIENT EDUCATION:  Education details: HEP, symptom management  Person educated: Patient Education method: Explanation, Demonstration, Tactile cues, and Verbal cues Education comprehension: verbalized understanding, returned demonstration, verbal cues required, tactile cues required, and needs further education  HOME EXERCISE PROGRAM: Access Code: [email or web address] URL: [email or web address] Date: 04/24/2023 Prepared by: Fransisco Hertz  Program Notes - for step taps, please use empty water bottle for increased control/balance  Exercises - Shoulder extension with resistance - Neutral  - 1 x daily - 7 x weekly - 3 sets -  10 reps - Scapular Retraction with Resistance  - 1 x daily - 7 x weekly - 3 sets - 10 reps - Step Taps with Unilateral Counter Support  - 1 x daily - 7 x weekly - 2 sets - 8 reps - Carioca with Counter Support  - 1 x daily - 7 x weekly - 2 sets - 8 reps - Standing Tandem Balance with Counter Support  - 1 x daily - 7 x weekly - 2  sets - 1-2 reps - 30sec hold   AQUATIC Access Code: [email or web address] URL: [email or web address] Date: 04/21/2023 Prepared by: [facility] [address] This aquatic home exercise program from MedBridge utilizes pictures from land based exercises, but has been adapted prior to lamination and issuance.    ASSESSMENT:  CLINICAL IMPRESSION: Treatment session focused on pt d/c. Pt feels she has a good land and aquatics HEP to continue to progress herself at home. Reviewed HEP and how to use gym equipment for further strengthening. Pt has met or partially met all of her LTGs Strength has more than doubled since eval day. Pt does demo some instability/decreased balance -- pt knowledgeable on this and discussed exercises to help improve her balance.    From initial evaluation:  Patient is a 83 year old female status post fall leading to T12 wedge compression fracture.  Per MD she is lost 60% of her spinal segment height.  The patient significant pain with prolonged positioning.  She has difficulty with sit to stand transfers.  She is previously very active.  She previously went to water aerobics 3-4 times a week.  She presents with lower extremity weakness, decreased spinal mobility, decrease general functional mobility.  She would benefit from skilled therapy to return to active lifestyle.  OBJECTIVE IMPAIRMENTS: Abnormal gait, decreased activity tolerance, difficulty walking, decreased ROM, decreased strength, increased muscle spasms, postural dysfunction, and pain.   ACTIVITY LIMITATIONS: carrying, lifting, bending, sitting, standing, sleeping, stairs, transfers, and locomotion level  PARTICIPATION LIMITATIONS: meal prep, cleaning, laundry, driving, shopping, community activity, and yard work  PERSONAL FACTORS: Age and Time since onset of injury/illness/exacerbation are also affecting patient's functional outcome.   REHAB POTENTIAL: Good  CLINICAL DECISION MAKING:  Evolving/moderate complexity  EVALUATION COMPLEXITY: Moderate   GOALS: Goals reviewed with patient? Yes  SHORT TERM GOALS: Target date: 04/22/2023    Patient will stand in a neutral position without increased back pain Baseline: Goal status: MET - 04/21/23  2.  Increase gross bilateral lower extremity strength by 5 pounds Baseline: see above Goal status: MET - 04/21/23  3.  Patient will be independent with basic HEP on land and in water Baseline:  Goal status: MET -04/21/23  4.  Patient will report a 50% reduction in tenderness to palpation in bilateral paraspinal Baseline:  Goal status: MET -04/21/23   LONG TERM GOALS: Target date: 05/20/2023    Patient will transfer sit to stand without use of hands in order to improve general functional mobility Baseline:  Goal status: MET - 04/24/23 able to perform multiple reps without UE support or pain   2.  Patient will transfer out of bed without increased pain Baseline:  improving, still some inc time/effort per pt report 04/24/23 Goal status: PARTIALLY MET -- 2/10 pain with transferring out of bed 05/01/23   3.  Patient will ambulate community distances without increased pain Baseline:  Goal status: MET - 04/21/23  4.  Patient will return to water aerobics Baseline:  PROGRESSING - 04/24/23 starting to do  independent aquatics  Goal status: MET - 05/01/23 has been independent with her aquatic HEP and has a laminated copy  5.  Patient will go up/down 8 steps without increased pain Baseline:  PARTIALLY MET 04/24/23 - able to perform without pain but does have some postural instability Goal status: MET 05/01/23 -  PLAN:  PT FREQUENCY: 2x/week  PT DURATION: 8 weeks  PLANNED INTERVENTIONS: 97110-Therapeutic exercises, 97530- Therapeutic activity, 97112- Neuromuscular re-education, 97535- Self Care, 09604- Manual therapy, [phone]- Gait training, [phone]- Aquatic Therapy, 97014- Electrical stimulation (unattended), 97035- Ultrasound,  Patient/Family education, Stair training, Taping, Dry Needling, DME instructions, Cryotherapy, and Moist heat   PLAN FOR NEXT SESSION:  Land: work on postural stability, functional mobility within pt tolerance  (see progress note)  Ocie Stanzione April Dell Ponto, PT 05/01/23 8:55 AM [facility] [address] [place], [place], [address] Phone: [phone]   Fax:  [phone]

## 2023-05-09 ENCOUNTER — Telehealth: Payer: Self-pay

## 2023-05-09 ENCOUNTER — Other Ambulatory Visit (HOSPITAL_COMMUNITY): Payer: Self-pay

## 2023-05-09 NOTE — Telephone Encounter (Signed)
 Pharmacy Patient Advocate Encounter   Received notification from  [employer] Portal that prior authorization for PROLIA  is required/requested.   Insurance verification completed.   The patient is insured through [employer] .   Per test claim: PA required; PA submitted to above mentioned insurance via CoverMyMeds Key/confirmation #/EOC [facility] Status is pending

## 2023-05-09 NOTE — Telephone Encounter (Signed)
 [name]

## 2023-05-09 NOTE — Telephone Encounter (Signed)
 Prolia VOB initiated via [email or web address]  Next Prolia inj DUE: NEW START

## 2023-05-12 ENCOUNTER — Other Ambulatory Visit (HOSPITAL_COMMUNITY): Payer: Self-pay

## 2023-05-12 NOTE — Telephone Encounter (Signed)
 Pt ready for scheduling for PROLIA  on or after : 05/12/23  Option# 1: Buy/Bill (Office supplied medication)  Out-of-pocket cost due at time of clinic visit: $40  Number of injection/visits approved: 2  Primary: [employer] Prolia  co-insurance: $40 Admin fee co-insurance: 0%  Secondary: --- Prolia  co-insurance:  Admin fee co-insurance:   Medical Benefit Details: Date Benefits were checked: 05/09/23 Deductible: NO/ Coinsurance: $40/ Admin Fee: 0%  Prior Auth: APPROVED PA# [id number] Expiration Date: 05/09/23-01/21/24   # of doses approved: 2 ----------------------------------------------------------------------- Option# 2- Med Obtained from pharmacy:  Pharmacy benefit: Copay $64 (Paid to pharmacy) Admin Fee: 0% (Pay at clinic)  Prior Auth: N/A PA# Expiration Date:   # of doses approved:   If patient wants fill through the pharmacy benefit please send prescription to: [employer], and include estimated need by date in rx notes. Pharmacy will ship medication directly to the office.  Patient NOT eligible for Prolia  Copay Card. Copay Card can make patient's cost as little as $25. Link to apply: [email or web address]://[email or web address]  ** This summary of benefits is an estimation of the patient's out-of-pocket cost. Exact cost may very based on individual plan coverage.

## 2023-05-12 NOTE — Telephone Encounter (Signed)
 Pharmacy Patient Advocate Encounter  Received notification from [employer] that Prior Authorization for PROLIA  has been APPROVED from 05/09/23 to 01/21/24. Ran test claim, Copay is $64. This test claim was processed through [facility]- copay amounts may vary at other pharmacies due to pharmacy/plan contracts, or as the patient moves through the different stages of their insurance plan.   PA #/Case ID/Reference #: [id number]

## 2023-05-13 ENCOUNTER — Other Ambulatory Visit (INDEPENDENT_AMBULATORY_CARE_PROVIDER_SITE_OTHER)

## 2023-05-13 DIAGNOSIS — I10 Essential (primary) hypertension: Secondary | ICD-10-CM

## 2023-05-13 DIAGNOSIS — E559 Vitamin D deficiency, unspecified: Secondary | ICD-10-CM | POA: Diagnosis not present

## 2023-05-13 DIAGNOSIS — E785 Hyperlipidemia, unspecified: Secondary | ICD-10-CM

## 2023-05-13 DIAGNOSIS — E038 Other specified hypothyroidism: Secondary | ICD-10-CM

## 2023-05-13 LAB — CBC WITH DIFFERENTIAL/PLATELET
Basophils Absolute: 0.1 10*3/uL (ref 0.0–0.1)
Basophils Relative: 4.3 % — ABNORMAL HIGH (ref 0.0–3.0)
Eosinophils Absolute: 0.2 10*3/uL (ref 0.0–0.7)
Eosinophils Relative: 5.5 % — ABNORMAL HIGH (ref 0.0–5.0)
HCT: 38.8 % (ref 36.0–46.0)
Hemoglobin: 12.9 g/dL (ref 12.0–15.0)
Lymphocytes Relative: 32.7 % (ref 12.0–46.0)
Lymphs Abs: 1.1 10*3/uL (ref 0.7–4.0)
MCHC: 33.3 g/dL (ref 30.0–36.0)
MCV: 94.6 fl (ref 78.0–100.0)
Monocytes Absolute: 0.3 10*3/uL (ref 0.1–1.0)
Monocytes Relative: 9.2 % (ref 3.0–12.0)
Neutro Abs: 1.6 10*3/uL (ref 1.4–7.7)
Neutrophils Relative %: 48.3 % (ref 43.0–77.0)
Platelets: 268 10*3/uL (ref 150.0–400.0)
RBC: 4.1 Mil/uL (ref 3.87–5.11)
RDW: 14.9 % (ref 11.5–15.5)
WBC: 3.2 10*3/uL — ABNORMAL LOW (ref 4.0–10.5)

## 2023-05-13 LAB — LIPID PANEL
Cholesterol: 239 mg/dL — ABNORMAL HIGH (ref 0–200)
HDL: 90.2 mg/dL (ref >39.00)
LDL Cholesterol: 139 mg/dL — ABNORMAL HIGH (ref 0–99)
NonHDL: 149.05
Total CHOL/HDL Ratio: 3
Triglycerides: 50 mg/dL (ref 0.0–149.0)
VLDL: 10 mg/dL (ref 0.0–40.0)

## 2023-05-13 LAB — COMPREHENSIVE METABOLIC PANEL WITH GFR
ALT: 18 U/L (ref 0–35)
AST: 19 U/L (ref 0–37)
Albumin: 4 g/dL (ref 3.5–5.2)
Alkaline Phosphatase: 56 U/L (ref 39–117)
BUN: 13 mg/dL (ref 6–23)
CO2: 31 meq/L (ref 19–32)
Calcium: 9.1 mg/dL (ref 8.4–10.5)
Chloride: 105 meq/L (ref 96–112)
Creatinine, Ser: 0.68 mg/dL (ref 0.40–1.20)
GFR: 81.83 mL/min (ref >60.00)
Glucose, Bld: 85 mg/dL (ref 70–99)
Potassium: 4.7 meq/L (ref 3.5–5.1)
Sodium: 142 meq/L (ref 135–145)
Total Bilirubin: 0.5 mg/dL (ref 0.2–1.2)
Total Protein: 7.2 g/dL (ref 6.0–8.3)

## 2023-05-13 LAB — TSH: TSH: 6.65 u[IU]/mL — ABNORMAL HIGH (ref 0.35–5.50)

## 2023-05-13 LAB — VITAMIN D 25 HYDROXY (VIT D DEFICIENCY, FRACTURES): VITD: 29.41 ng/mL — ABNORMAL LOW (ref 30.00–100.00)

## 2023-05-14 ENCOUNTER — Telehealth: Payer: Self-pay | Admitting: Internal Medicine

## 2023-05-14 ENCOUNTER — Encounter: Payer: Self-pay | Admitting: Internal Medicine

## 2023-05-14 NOTE — Telephone Encounter (Unsigned)
 Copied from CRM [id number]. Topic: Clinical - Lab/Test Results >> May 14, 2023 10:35 AM Clyde Darling P wrote: Reason for CRM: Pt missed call- stated it could be for lab results- pt would like callback.

## 2023-05-21 DIAGNOSIS — M810 Age-related osteoporosis without current pathological fracture: Secondary | ICD-10-CM | POA: Diagnosis not present

## 2023-05-26 ENCOUNTER — Encounter: Payer: Self-pay | Admitting: Internal Medicine

## 2023-06-03 NOTE — Telephone Encounter (Signed)
 Patient is cleared for PROLIA  injection on 06/20/2023 per PA information on 05/12/23.  Mentioned in provider note last on 04/16/23. Medication is CLINIC supplied. CoPay:$40

## 2023-06-05 ENCOUNTER — Telehealth: Payer: Self-pay | Admitting: Internal Medicine

## 2023-06-05 NOTE — Telephone Encounter (Signed)
 Copied from CRM [id number]. Topic: General - Other >> Jun 04, 2023  4:14 PM Alyse July wrote: Reason for CRM: [place] would like a call back to confirm that osteoporosis document has not been received. [phone] to leave Vm) trying to close a compliance gap to ensure patient has completed bone dx test.

## 2023-06-10 NOTE — Telephone Encounter (Signed)
 Message left today that bone density was done on 04/25/23 but no form received.

## 2023-06-20 ENCOUNTER — Ambulatory Visit

## 2023-06-20 ENCOUNTER — Other Ambulatory Visit: Payer: Self-pay

## 2023-06-20 DIAGNOSIS — M810 Age-related osteoporosis without current pathological fracture: Secondary | ICD-10-CM

## 2023-06-20 MED ORDER — DENOSUMAB 60 MG/ML ~~LOC~~ SOSY: 60.0000 mg | PREFILLED_SYRINGE | Freq: Once | SUBCUTANEOUS | Status: AC

## 2023-06-20 NOTE — Progress Notes (Signed)
 Patient visits today for their prolia injection. Patient informed of what they received and tolerated injection well. Patient notified to reach out to office if needed.

## 2023-06-23 ENCOUNTER — Other Ambulatory Visit: Payer: Self-pay | Admitting: Internal Medicine

## 2023-07-26 ENCOUNTER — Telehealth: Admitting: Nurse Practitioner

## 2023-07-26 DIAGNOSIS — R399 Unspecified symptoms and signs involving the genitourinary system: Secondary | ICD-10-CM

## 2023-07-26 MED ORDER — CEPHALEXIN 500 MG PO CAPS: 500.0000 mg | ORAL_CAPSULE | Freq: Two times a day (BID) | ORAL | 0 refills | Status: AC

## 2023-07-26 NOTE — Progress Notes (Signed)

## 2023-07-26 NOTE — Progress Notes (Signed)
 I have spent 5 minutes in review of e-visit questionnaire, review and updating patient chart, medical decision making and response to patient.   Claiborne Rigg, NP

## 2023-07-29 DIAGNOSIS — I10 Essential (primary) hypertension: Secondary | ICD-10-CM | POA: Diagnosis not present

## 2023-07-29 DIAGNOSIS — R21 Rash and other nonspecific skin eruption: Secondary | ICD-10-CM | POA: Diagnosis not present

## 2023-07-29 DIAGNOSIS — L821 Other seborrheic keratosis: Secondary | ICD-10-CM | POA: Diagnosis not present

## 2023-07-29 DIAGNOSIS — R5382 Chronic fatigue, unspecified: Secondary | ICD-10-CM | POA: Diagnosis not present

## 2023-07-29 DIAGNOSIS — K5790 Diverticulosis of intestine, part unspecified, without perforation or abscess without bleeding: Secondary | ICD-10-CM | POA: Diagnosis not present

## 2023-07-29 DIAGNOSIS — M439 Deforming dorsopathy, unspecified: Secondary | ICD-10-CM | POA: Diagnosis not present

## 2023-07-29 DIAGNOSIS — M109 Gout, unspecified: Secondary | ICD-10-CM | POA: Diagnosis not present

## 2023-07-29 DIAGNOSIS — Z9181 History of falling: Secondary | ICD-10-CM | POA: Diagnosis not present

## 2023-07-29 DIAGNOSIS — I788 Other diseases of capillaries: Secondary | ICD-10-CM | POA: Diagnosis not present

## 2023-07-29 DIAGNOSIS — L2989 Other pruritus: Secondary | ICD-10-CM | POA: Diagnosis not present

## 2023-07-29 DIAGNOSIS — M154 Erosive (osteo)arthritis: Secondary | ICD-10-CM | POA: Diagnosis not present

## 2023-07-29 DIAGNOSIS — S60051A Contusion of right little finger without damage to nail, initial encounter: Secondary | ICD-10-CM | POA: Diagnosis not present

## 2023-07-29 DIAGNOSIS — M159 Polyosteoarthritis, unspecified: Secondary | ICD-10-CM | POA: Diagnosis not present

## 2023-07-29 DIAGNOSIS — L820 Inflamed seborrheic keratosis: Secondary | ICD-10-CM | POA: Diagnosis not present

## 2023-07-29 DIAGNOSIS — M8580 Other specified disorders of bone density and structure, unspecified site: Secondary | ICD-10-CM | POA: Diagnosis not present

## 2023-07-29 DIAGNOSIS — M4854XA Collapsed vertebra, not elsewhere classified, thoracic region, initial encounter for fracture: Secondary | ICD-10-CM | POA: Diagnosis not present

## 2023-07-29 DIAGNOSIS — L853 Xerosis cutis: Secondary | ICD-10-CM | POA: Diagnosis not present

## 2023-07-29 DIAGNOSIS — E785 Hyperlipidemia, unspecified: Secondary | ICD-10-CM | POA: Diagnosis not present

## 2023-08-07 ENCOUNTER — Other Ambulatory Visit: Payer: Self-pay

## 2023-08-07 ENCOUNTER — Encounter: Payer: Self-pay | Admitting: Internal Medicine

## 2023-08-07 MED ORDER — ROSUVASTATIN CALCIUM 5 MG PO TABS: 5.0000 mg | ORAL_TABLET | Freq: Every day | ORAL | 0 refills | Status: DC

## 2023-08-18 ENCOUNTER — Encounter: Payer: Self-pay | Admitting: Internal Medicine

## 2023-08-18 ENCOUNTER — Ambulatory Visit: Admitting: Internal Medicine

## 2023-08-18 VITALS — BP 120/70 | HR 78 | Temp 98.1°F | Ht 67.0 in | Wt 173.0 lb

## 2023-08-18 DIAGNOSIS — I10 Essential (primary) hypertension: Secondary | ICD-10-CM

## 2023-08-18 DIAGNOSIS — M8000XD Age-related osteoporosis with current pathological fracture, unspecified site, subsequent encounter for fracture with routine healing: Secondary | ICD-10-CM

## 2023-08-18 DIAGNOSIS — S22009D Unspecified fracture of unspecified thoracic vertebra, subsequent encounter for fracture with routine healing: Secondary | ICD-10-CM | POA: Diagnosis not present

## 2023-08-18 NOTE — Assessment & Plan Note (Signed)
 History of compression fracture T12 in October 2024-results of following back while walking her dog Routine healing-did see neurosurgery, but no intervention was done Minimal pain when she first stands up-improves as she starts to walk Taking calcium  and vitamin D  On Prolia  every 6 months for osteoporosis Discussed fall prevention

## 2023-08-18 NOTE — Patient Instructions (Signed)
        Medications changes include :   None

## 2023-08-18 NOTE — Progress Notes (Signed)
      Subjective:    Patient ID: [name], female    DOB: 10/25/1940, 83 y.o.   MRN: [id number]     HPI [name] is here for follow up of her chronic medical problems.  She had questions about her fracture and osteoporosis  Clemens 10/2022 - walking dog and dog took off and she fell backwards - xrays showed T12 fx. she did see neurosurgery and no intervention was done-routine healing.  She has minimal pain.  Most of her pain is when she first stands up and it improves as she continues to walk.  Osteoporosis-on Prolia  every 6 months-stressed the importance of continuing this long-term.  Taking calcium  and vitamin D  daily    Medications and allergies reviewed with patient and updated if appropriate.  Current Outpatient Medications on File Prior to Visit  Medication Sig Dispense Refill   amLODipine  (NORVASC ) 5 MG tablet TAKE ONE TABLET BY MOUTH DAILY 90 tablet 1   calcium -vitamin D  (OSCAL WITH D) 500-200 MG-UNIT tablet Calcium  500 + D (D3)  1 po qd otc     escitalopram  (LEXAPRO ) 5 MG tablet TAKE ONE TABLET BY MOUTH ONCE DAILY 30 tablet 3   hydroxychloroquine (PLAQUENIL) 200 MG tablet Take 200 mg by mouth 2 (two) times daily.     rosuvastatin  (CRESTOR ) 5 MG tablet TAKE ONE TABLET BY MOUTH THREE TIMES A WEEK 39 tablet 1   rosuvastatin  (CRESTOR ) 5 MG tablet Take 1 tablet (5 mg total) by mouth daily. 90 tablet 0   triamcinolone  cream (KENALOG ) 0.1 % SMARTSIG:sparingly Topical Twice Daily PRN     Current Facility-Administered Medications on File Prior to Visit  Medication Dose Route Frequency Provider Last Rate Last Admin   [START ON 12/21/2023] denosumab  (PROLIA ) injection 60 mg  60 mg Subcutaneous Once Geofm Glade PARAS, MD         Review of Systems     Objective:   Vitals:   08/18/23 0838  BP: 120/70  Pulse: 78  Temp: 98.1 F (36.7 C)  SpO2: 99%   BP Readings from Last 3 Encounters:  08/18/23 120/70  04/16/23 120/76  11/15/22 126/68   Wt Readings from Last 3  Encounters:  08/18/23 173 lb (78.5 kg)  04/16/23 174 lb (78.9 kg)  11/15/22 174 lb 12.8 oz (79.3 kg)   Body mass index is 27.1 kg/m.    Physical Exam     Lab Results  Component Value Date   WBC 3.2 (L) 05/13/2023   HGB 12.9 05/13/2023   HCT 38.8 05/13/2023   PLT 268.0 05/13/2023   GLUCOSE 85 05/13/2023   CHOL 239 (H) 05/13/2023   TRIG 50.0 05/13/2023   HDL 90.20 05/13/2023   LDLDIRECT 149.6 03/19/2012   LDLCALC 139 (H) 05/13/2023   ALT 18 05/13/2023   AST 19 05/13/2023   NA 142 05/13/2023   K 4.7 05/13/2023   CL 105 05/13/2023   CREATININE 0.68 05/13/2023   BUN 13 05/13/2023   CO2 31 05/13/2023   TSH 6.65 (H) 05/13/2023   HGBA1C 5.6 07/16/2021     Assessment & Plan:    See Problem List for Assessment and Plan of chronic medical problems.

## 2023-08-18 NOTE — Assessment & Plan Note (Signed)
 Chronic BP well controlled  monitor BP at home Continue amlodipine  5 mg daily

## 2023-08-18 NOTE — Assessment & Plan Note (Signed)
 Chronic DEXA up-to-date History of T12 compression fracture Continue regular exercise Continue taking calcium  and vitamin d  daily Continue Prolia  every 6 months

## 2023-09-11 ENCOUNTER — Other Ambulatory Visit: Payer: Self-pay | Admitting: Internal Medicine

## 2023-09-14 ENCOUNTER — Encounter: Payer: Self-pay | Admitting: Internal Medicine

## 2023-09-16 NOTE — Progress Notes (Unsigned)
   LILLETTE Ileana Collet, PhD, LAT, ATC acting as a scribe for Artist Lloyd, MD.  [name] is a 83 y.o. female who presents to [facility] at [facility] today for L wrist pain, significantly worsening since last Tuesday, 8/19. Pt locates pain to ***  Radiates: Paresthesia: Grip strength: Aggravates: Treatments tried:  Pertinent review of systems: ***  Relevant historical information: ***   Exam:  There were no vitals taken for this visit. General: Well Developed, well nourished, and in no acute distress.   MSK: ***    Lab and Radiology Results No results found for this or any previous visit (from the past 72 hours). No results found.     Assessment and Plan: 83 y.o. female with ***   PDMP not reviewed this encounter. No orders of the defined types were placed in this encounter.  No orders of the defined types were placed in this encounter.    Discussed warning signs or symptoms. Please see discharge instructions. Patient expresses understanding.   ***

## 2023-09-17 ENCOUNTER — Ambulatory Visit: Admitting: Family Medicine

## 2023-09-17 ENCOUNTER — Other Ambulatory Visit: Payer: Self-pay

## 2023-09-17 ENCOUNTER — Ambulatory Visit (INDEPENDENT_AMBULATORY_CARE_PROVIDER_SITE_OTHER)

## 2023-09-17 VITALS — BP 114/76 | HR 65 | Ht 67.0 in | Wt 173.0 lb

## 2023-09-17 DIAGNOSIS — M79632 Pain in left forearm: Secondary | ICD-10-CM | POA: Diagnosis not present

## 2023-09-17 DIAGNOSIS — M79642 Pain in left hand: Secondary | ICD-10-CM

## 2023-09-17 DIAGNOSIS — M7989 Other specified soft tissue disorders: Secondary | ICD-10-CM | POA: Diagnosis not present

## 2023-09-17 DIAGNOSIS — S63233A Subluxation of proximal interphalangeal joint of left middle finger, initial encounter: Secondary | ICD-10-CM | POA: Diagnosis not present

## 2023-09-17 DIAGNOSIS — M19042 Primary osteoarthritis, left hand: Secondary | ICD-10-CM | POA: Diagnosis not present

## 2023-09-17 LAB — COMPREHENSIVE METABOLIC PANEL WITH GFR
ALT: 16 U/L (ref 0–35)
AST: 17 U/L (ref 0–37)
Albumin: 4 g/dL (ref 3.5–5.2)
Alkaline Phosphatase: 49 U/L (ref 39–117)
BUN: 16 mg/dL (ref 6–23)
CO2: 28 meq/L (ref 19–32)
Calcium: 8.7 mg/dL (ref 8.4–10.5)
Chloride: 103 meq/L (ref 96–112)
Creatinine, Ser: 0.55 mg/dL (ref 0.40–1.20)
GFR: 85.91 mL/min (ref >60.00)
Glucose, Bld: 83 mg/dL (ref 70–99)
Potassium: 4 meq/L (ref 3.5–5.1)
Sodium: 140 meq/L (ref 135–145)
Total Bilirubin: 0.5 mg/dL (ref 0.2–1.2)
Total Protein: 7.6 g/dL (ref 6.0–8.3)

## 2023-09-17 LAB — URIC ACID: Uric Acid, Serum: 3.4 mg/dL (ref 2.4–7.0)

## 2023-09-17 MED ORDER — PREDNISONE 50 MG PO TABS: ORAL_TABLET | ORAL | 0 refills | Status: DC

## 2023-09-17 NOTE — Patient Instructions (Addendum)
 Thank you for coming in today.   Consider a gentle leader for your dog.   Please get an Xray today before you leave   I've sent a prescription for Prednisone  to your pharmacy.   Please visit the labs at our [place] office.   Check about a week before you leave for [place].

## 2023-09-18 ENCOUNTER — Ambulatory Visit: Payer: Self-pay | Admitting: Family Medicine

## 2023-09-18 NOTE — Progress Notes (Signed)
 Uric acid/gout level is 3.4 which is in the normal range.  No evidence of gout.  Kidney function looks just fine.

## 2023-09-29 NOTE — Progress Notes (Signed)
 Left forearm x-ray looks okay to radiology.

## 2023-09-29 NOTE — Progress Notes (Signed)
 Left hand x-ray shows severe arthritis

## 2023-10-10 NOTE — Progress Notes (Deleted)
   LILLETTE Ileana Collet, PhD, LAT, ATC acting as a scribe for Artist Lloyd, MD.  [name] is a 83 y.o. female who presents to [facility] at [facility] today for f/u L hand pain. Pt was last seen by Dr. Lloyd on 09/17/23 and was advised to use Voltaren  gel and Tylenol. Also prescribed prednisone  and labs were obtained.   Today, pt reports ***  Dx testing: 09/17/23 L hand & L forearm XR and labs  Pertinent review of systems: ***  Relevant historical information: ***   Exam:  There were no vitals taken for this visit. General: Well Developed, well nourished, and in no acute distress.   MSK: ***    Lab and Radiology Results No results found for this or any previous visit (from the past 72 hours). No results found.     Assessment and Plan: 83 y.o. female with ***   PDMP not reviewed this encounter. No orders of the defined types were placed in this encounter.  No orders of the defined types were placed in this encounter.    Discussed warning signs or symptoms. Please see discharge instructions. Patient expresses understanding.   ***

## 2023-10-13 ENCOUNTER — Ambulatory Visit: Admitting: Family Medicine

## 2023-10-14 ENCOUNTER — Other Ambulatory Visit: Payer: Self-pay

## 2023-10-14 ENCOUNTER — Emergency Department (HOSPITAL_BASED_OUTPATIENT_CLINIC_OR_DEPARTMENT_OTHER)
Admission: EM | Admit: 2023-10-14 | Discharge: 2023-10-14 | Disposition: A | Discharge status: Home / Self Care | Attending: Emergency Medicine | Admitting: Emergency Medicine

## 2023-10-14 ENCOUNTER — Emergency Department (HOSPITAL_BASED_OUTPATIENT_CLINIC_OR_DEPARTMENT_OTHER)

## 2023-10-14 DIAGNOSIS — I6523 Occlusion and stenosis of bilateral carotid arteries: Secondary | ICD-10-CM | POA: Diagnosis not present

## 2023-10-14 DIAGNOSIS — M47812 Spondylosis without myelopathy or radiculopathy, cervical region: Secondary | ICD-10-CM | POA: Diagnosis not present

## 2023-10-14 DIAGNOSIS — S60221A Contusion of right hand, initial encounter: Secondary | ICD-10-CM | POA: Diagnosis not present

## 2023-10-14 DIAGNOSIS — M542 Cervicalgia: Secondary | ICD-10-CM | POA: Diagnosis not present

## 2023-10-14 DIAGNOSIS — S8001XA Contusion of right knee, initial encounter: Secondary | ICD-10-CM | POA: Insufficient documentation

## 2023-10-14 DIAGNOSIS — I10 Essential (primary) hypertension: Secondary | ICD-10-CM | POA: Insufficient documentation

## 2023-10-14 DIAGNOSIS — W010XXA Fall on same level from slipping, tripping and stumbling without subsequent striking against object, initial encounter: Secondary | ICD-10-CM | POA: Insufficient documentation

## 2023-10-14 DIAGNOSIS — S0990XA Unspecified injury of head, initial encounter: Secondary | ICD-10-CM | POA: Diagnosis present

## 2023-10-14 DIAGNOSIS — Z043 Encounter for examination and observation following other accident: Secondary | ICD-10-CM | POA: Diagnosis not present

## 2023-10-14 DIAGNOSIS — W19XXXA Unspecified fall, initial encounter: Secondary | ICD-10-CM

## 2023-10-14 DIAGNOSIS — S8002XA Contusion of left knee, initial encounter: Secondary | ICD-10-CM | POA: Diagnosis not present

## 2023-10-14 DIAGNOSIS — S60222A Contusion of left hand, initial encounter: Secondary | ICD-10-CM | POA: Diagnosis not present

## 2023-10-14 DIAGNOSIS — Z79899 Other long term (current) drug therapy: Secondary | ICD-10-CM | POA: Insufficient documentation

## 2023-10-14 DIAGNOSIS — M4802 Spinal stenosis, cervical region: Secondary | ICD-10-CM | POA: Diagnosis not present

## 2023-10-14 DIAGNOSIS — T07XXXA Unspecified multiple injuries, initial encounter: Secondary | ICD-10-CM

## 2023-10-14 DIAGNOSIS — S0083XA Contusion of other part of head, initial encounter: Secondary | ICD-10-CM | POA: Insufficient documentation

## 2023-10-14 MED ORDER — IBUPROFEN 400 MG PO TABS
400.0000 mg | ORAL_TABLET | Freq: Once | ORAL | Status: AC
  Administered 2023-10-14: 400 mg via ORAL
  Filled 2023-10-14: qty 1

## 2023-10-14 NOTE — ED Notes (Signed)
 Wound irrigated with normal saline. Bacitracin applied to exterior laceration.

## 2023-10-14 NOTE — ED Triage Notes (Signed)
 Mechanical fall this morning on concrete, Denies LOC. Small lac to chin and lip. Denies Thinner.

## 2023-10-14 NOTE — ED Provider Notes (Signed)
  EMERGENCY DEPARTMENT AT [facility] Provider Note   CSN: [id number] Arrival date & time: 10/14/23  1054     Patient presents with: [name] is a 83 y.o. female.   HPI     83 year old patient comes in with chief complaint of mechanical fall. Patient has history of hypertension, hyperlipidemia.  Patient states that she was walking outside, she tripped and fell forward.  She tried to break the fall with her upper extremity, but ended up striking her face onto the pavement.  Patient did not lose consciousness.  She is complaining of pain in her face, mild headache, has mild neck discomfort and discomfort over her knee and hand.  Patient has been able to ambulate since the fall.  She is up-to-date with her tetanus.  Prior to Admission medications   Medication Sig Start Date End Date Taking? Authorizing Provider  amLODipine  (NORVASC ) 5 MG tablet TAKE ONE TABLET BY MOUTH DAILY 06/23/23   Geofm Glade PARAS, MD  calcium -vitamin D  (OSCAL WITH D) 500-200 MG-UNIT tablet Calcium  500 + D (D3)  1 po qd otc    [provider]  escitalopram  (LEXAPRO ) 5 MG tablet TAKE ONE TABLET BY MOUTH ONCE DAILY 09/12/23   Geofm Glade PARAS, MD  hydroxychloroquine (PLAQUENIL) 200 MG tablet Take 200 mg by mouth 2 (two) times daily.    [provider]  predniSONE  (DELTASONE ) 50 MG tablet Take 1 pill daily for 5 days 09/17/23   Joane Artist RAMAN, MD  rosuvastatin  (CRESTOR ) 5 MG tablet Take 1 tablet (5 mg total) by mouth daily. 08/07/23   Geofm Glade PARAS, MD  triamcinolone  cream (KENALOG ) 0.1 % SMARTSIG:sparingly Topical Twice Daily PRN 07/29/23   [provider]    Allergies: Patient has no known allergies.    Review of Systems  All other systems reviewed and are negative.   Updated Vital Signs BP (!) 157/99 (BP Location: Right Arm)   Pulse 99   Temp 98.7 F (37.1 C) (Oral)   Resp 18   SpO2 98%   Physical Exam Vitals and nursing note reviewed.   Constitutional:      Appearance: She is well-developed.  Eyes:     Extraocular Movements: Extraocular movements intact.     Pupils: Pupils are equal, round, and reactive to light.  Cardiovascular:     Rate and Rhythm: Normal rate and regular rhythm.  Pulmonary:     Effort: Pulmonary effort is normal. No respiratory distress.  Musculoskeletal:        General: No deformity.     Cervical back: Neck supple. No tenderness.     Comments: Bruise to both palms, both knees, right worse than left.  Otherwise: No long bone tenderness - upper and lower extrmeities and no pelvic pain, instability.  Skin:    General: Skin is warm and dry.     Findings: Bruising, erythema and rash present.  Neurological:     Mental Status: She is alert and oriented to person, place, and time.     Cranial Nerves: No cranial nerve deficit.     (all labs ordered are listed, but only abnormal results are displayed) Labs Reviewed - No data to display  EKG: None  Radiology: CT Head Wo Contrast Result Date: 10/14/2023 CLINICAL DATA:  Provided history: Fall. EXAM: CT HEAD WITHOUT CONTRAST TECHNIQUE: Contiguous axial images were obtained from the base of the skull through the vertex without intravenous contrast. RADIATION DOSE REDUCTION: This exam was performed according  to the departmental dose-optimization program which includes automated exposure control, adjustment of the mA and/or kV according to patient size and/or use of iterative reconstruction technique. COMPARISON:  None. FINDINGS: Brain: No age-advanced or lobar predominant cerebral atrophy. There is no acute intracranial hemorrhage. No demarcated cortical infarct. No extra-axial fluid collection. No evidence of an intracranial mass. No midline shift. Vascular: No hyperdense vessel. Atherosclerotic calcifications. Skull: No calvarial fracture or aggressive osseous lesion. Sinuses/Orbits: No mass or acute finding within the imaged orbits. Minimal mucosal  thickening within the right maxillary sinus. Other: Nonspecific ill-defined calcifications within the right parietal scalp. IMPRESSION: 1. No evidence of an acute intracranial abnormality. 2. Minor right maxillary sinus mucosal thickening. Electronically Signed   By: Rockey Childs D.O.   On: 10/14/2023 11:58   CT Cervical Spine Wo Contrast Result Date: 10/14/2023 CLINICAL DATA:  Status post fall.  Neck pain. EXAM: CT CERVICAL SPINE WITHOUT CONTRAST TECHNIQUE: Multidetector CT imaging of the cervical spine was performed without intravenous contrast. Multiplanar CT image reconstructions were also generated. RADIATION DOSE REDUCTION: This exam was performed according to the departmental dose-optimization program which includes automated exposure control, adjustment of the mA and/or kV according to patient size and/or use of iterative reconstruction technique. COMPARISON:  None Available. FINDINGS: Alignment: Normal. Skull base and vertebrae: No evidence of acute cervical spine fracture or traumatic subluxation. Soft tissues and spinal canal: No prevertebral fluid or swelling. No visible canal hematoma. Disc levels: Relatively mild multilevel spondylosis for age, most advanced at C5-6. At C5-6, there is mild spinal stenosis and moderate foraminal narrowing, greater on the right. Asymmetric facet hypertrophy on the right at C2-3. The left C3-4 facet joint appears ankylosed. Upper chest: Mild biapical scarring. Other: Bilateral carotid atherosclerosis. 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 IMPRESSION: 1. No evidence of acute cervical spine fracture, traumatic subluxation or static signs of instability. 2. Relatively mild multilevel spondylosis for age, most advanced at C5-6. Electronically Signed   By: Elsie Perone M.D.   On: 10/14/2023 11:52     Procedures   Medications Ordered in the ED  ibuprofen  (ADVIL ) tablet 400 mg (has no administration in time range)                                     Medical Decision Making Amount and/or Complexity of Data Reviewed Radiology: ordered.  Risk Prescription drug management.   83 year old patient comes in after sustaining what appears to be a mechanical fall. Pertinent past medical includes no anticoagulation use. I have reviewed patient's records, patient follows up with family medicine and rheumatology.  Based on my history and exam, differential diagnosis includes: - Traumatic brain injury including intracranial hemorrhage -Cervical spine injury -Mandibular injury and dental injury - Long bone fractures - Contusions - Soft tissue injury - Concussion  Based on the initial assessment, the following workup was initiated CT scan of the head, C-spine.  X-rays of the extremity not indicated.  I have independently interpreted the following imaging from the perspective of acute trauma: CT scan of the brain and the results indicate no evidence of brain bleed.  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 RICE recommended.  Final diagnoses:  Fall, initial encounter  Contusion of face, initial encounter  Multiple contusions    ED Discharge Orders     None  Charlyn Sora, MD 10/14/23 1336

## 2023-10-14 NOTE — Discharge Instructions (Addendum)
 We recommend soft diet for the next 24 to 48 hours, then slowly advance the diet to normal food.  Take Tylenol  or Motrin  around-the-clock for symptom management.  Ice to areas of swelling and discomfort.  The laceration within your mouth should heal on its own.  You may use antiseptic mouthwash twice a day, just keep the area clean.

## 2023-10-15 ENCOUNTER — Other Ambulatory Visit: Payer: Self-pay

## 2023-10-15 ENCOUNTER — Ambulatory Visit: Admitting: Family Medicine

## 2023-10-15 ENCOUNTER — Ambulatory Visit

## 2023-10-15 VITALS — BP 132/78 | HR 80 | Ht 67.0 in

## 2023-10-15 DIAGNOSIS — M79642 Pain in left hand: Secondary | ICD-10-CM | POA: Diagnosis not present

## 2023-10-15 DIAGNOSIS — M25561 Pain in right knee: Secondary | ICD-10-CM | POA: Diagnosis not present

## 2023-10-15 DIAGNOSIS — M1812 Unilateral primary osteoarthritis of first carpometacarpal joint, left hand: Secondary | ICD-10-CM | POA: Diagnosis not present

## 2023-10-15 DIAGNOSIS — M19041 Primary osteoarthritis, right hand: Secondary | ICD-10-CM | POA: Diagnosis not present

## 2023-10-15 DIAGNOSIS — M79641 Pain in right hand: Secondary | ICD-10-CM

## 2023-10-15 DIAGNOSIS — M25562 Pain in left knee: Secondary | ICD-10-CM | POA: Diagnosis not present

## 2023-10-15 DIAGNOSIS — S63233A Subluxation of proximal interphalangeal joint of left middle finger, initial encounter: Secondary | ICD-10-CM | POA: Diagnosis not present

## 2023-10-15 NOTE — Progress Notes (Signed)
 LILLETTE Ileana Collet, PhD, LAT, ATC acting as a scribe for Artist Lloyd, MD.  [name] is a 83 y.o. female who presents to [facility] at [facility] today for f/u L hand pain. Pt was last seen by Dr. Lloyd on 09/17/23 and was advised to use Voltaren  gel and Tylenol . Also prescribed prednisone  and labs were obtained.   Today, pt reports she was walking her dog, wearing sandal, and face-planted on the side-walk yesterday. She notes a lac on her chin and multiple abrasions. She c/o bilat knee pain, R>L. Swelling present  Of note, she is leaving for a trip to [place] on Monday.   Dx testing: 09/17/23 L hand & L forearm XR and labs  Pertinent review of systems: No fevers or chills  Relevant historical information: Hypertension. Patient has an upcoming trip to [place] leaving Monday next week.   Exam:  BP 132/78   Pulse 80   Ht 5' 7 (1.702 m)   SpO2 98%   BMI 27.10 kg/m  General: Well Developed, well nourished, and in no acute distress.   MSK: Hands bilaterally abrasion palms.  Mildly tender to palpation across wrists.  Right fourth MCP is mildly bruised and mildly tender to palpation.  Normal hand and wrist motion.  Grip strength is intact.  Right knee abrasion and swelling anterior knee.  Not particularly tender to palpation anterior medial joint line.  Normal knee motion.    Lab and Radiology Results  Procedure: Real-time Ultrasound Guided Injection of right knee joint anterior medial approach device: Philips Affiniti 50G/GE Logiq Images permanently stored and available for review in PACS Verbal informed consent obtained.  Discussed risks and benefits of procedure. Warned about infection, bleeding, hyperglycemia damage to structures among others. Patient expresses understanding and agreement Time-out conducted.   Noted no overlying erythema, induration, or other signs of local infection.   Skin prepped in a sterile fashion.   Local anesthesia: Topical Ethyl  chloride.   With sterile technique and under real time ultrasound guidance: 40 mg of Kenalog  and 2 mL of Marcaine injected into knee joint. Fluid seen entering the joint capsule.   Completed without difficulty   Pain immediately resolved suggesting accurate placement of the medication.   Advised to call if fevers/chills, erythema, induration, drainage, or persistent bleeding.   Images permanently stored and available for review in the ultrasound unit.  Impression: Technically successful ultrasound guided injection.     X-ray images bilateral knees and bilateral hands obtained today personally and independently interpreted.  Right knee: Mild degeneration no acute fracture is visible.  Left knee mild degeneration no acute fracture visible.  Right hand: Severe degeneration multiple locations especially prominent across PIPs and at the base of the thumb.  No acute fracture is visible.  Left hand: Severe DJD.  Stable appearing changes.  No acute fracture.  Await formal radiology review     No results found for this or any previous visit (from the past 72 hours). CT Head Wo Contrast Result Date: 10/14/2023 CLINICAL DATA:  Provided history: Fall. EXAM: CT HEAD WITHOUT CONTRAST TECHNIQUE: Contiguous axial images were obtained from the base of the skull through the vertex without intravenous contrast. RADIATION DOSE REDUCTION: This exam was performed according to the departmental dose-optimization program which includes automated exposure control, adjustment of the mA and/or kV according to patient size and/or use of iterative reconstruction technique. COMPARISON:  None. FINDINGS: Brain: No age-advanced or lobar predominant cerebral atrophy. There is no acute intracranial  hemorrhage. No demarcated cortical infarct. No extra-axial fluid collection. No evidence of an intracranial mass. No midline shift. Vascular: No hyperdense vessel. Atherosclerotic calcifications. Skull: No calvarial fracture or  aggressive osseous lesion. Sinuses/Orbits: No mass or acute finding within the imaged orbits. Minimal mucosal thickening within the right maxillary sinus. Other: Nonspecific ill-defined calcifications within the right parietal scalp. IMPRESSION: 1. No evidence of an acute intracranial abnormality. 2. Minor right maxillary sinus mucosal thickening. Electronically Signed   By: Rockey Childs D.O.   On: 10/14/2023 11:58   CT Cervical Spine Wo Contrast Result Date: 10/14/2023 CLINICAL DATA:  Status post fall.  Neck pain. EXAM: CT CERVICAL SPINE WITHOUT CONTRAST TECHNIQUE: Multidetector CT imaging of the cervical spine was performed without intravenous contrast. Multiplanar CT image reconstructions were also generated. RADIATION DOSE REDUCTION: This exam was performed according to the departmental dose-optimization program which includes automated exposure control, adjustment of the mA and/or kV according to patient size and/or use of iterative reconstruction technique. COMPARISON:  None Available. FINDINGS: Alignment: Normal. Skull base and vertebrae: No evidence of acute cervical spine fracture or traumatic subluxation. Soft tissues and spinal canal: No prevertebral fluid or swelling. No visible canal hematoma. Disc levels: Relatively mild multilevel spondylosis for age, most advanced at C5-6. At C5-6, there is mild spinal stenosis and moderate foraminal narrowing, greater on the right. Asymmetric facet hypertrophy on the right at C2-3. The left C3-4 facet joint appears ankylosed. Upper chest: Mild biapical scarring. Other: Bilateral carotid atherosclerosis. 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 IMPRESSION: 1. No evidence of acute cervical spine fracture, traumatic subluxation or static signs of instability. 2. Relatively mild multilevel spondylosis for age, most advanced at C5-6. Electronically Signed   By: Elsie Perone M.D.   On: 10/14/2023 11:52       Assessment and Plan: 83 y.o. female with  predominantly right anterior knee pain and bilateral hand pain worsening after a fall.  X-rays and CT scans obtained in the emergency room yesterday in my clinic today do not show acute fracture per my read although radiology overread is still pending.  This fall is poorly timed as she has a upcoming trip to [place] on Monday the 29th that she would like to be able to attend.  I did a right knee interarticular injection today which did help.  Additionally I plan to have her come back on Friday the 26 to consider injection in her wrists if needed.  Additionally may prescribe some medicines to take with her on the trip if needed.   PDMP not reviewed this encounter. Orders Placed This Encounter  Procedures   DG Knee AP/LAT W/Sunrise Left    Standing Status:   Future    Number of Occurrences:   1    Expiration Date:   11/14/2023    Reason for Exam (SYMPTOM  OR DIAGNOSIS REQUIRED):   bilateral knee pain    Preferred imaging location?:   [facility]   DG Knee AP/LAT W/Sunrise Right    Standing Status:   Future    Number of Occurrences:   1    Expiration Date:   11/14/2023    Reason for Exam (SYMPTOM  OR DIAGNOSIS REQUIRED):   bilateral knee pain    Preferred imaging location?:   [facility]   US  LIMITED JOINT SPACE STRUCTURES LOW RIGHT(NO LINKED CHARGES)    Reason for Exam (SYMPTOM  OR DIAGNOSIS REQUIRED):   right knee pain    Preferred imaging location?:   [facility] Hand Complete Left    Standing Status:   Future    Number of Occurrences:   1    Expiration Date:   11/14/2023    Reason for Exam (SYMPTOM  OR DIAGNOSIS REQUIRED):   bilat hand pain    Preferred imaging location?:   [facility]   DG Hand Complete Right    Standing Status:   Future    Number of Occurrences:   1    Expiration Date:   11/14/2023    Reason for Exam (SYMPTOM  OR DIAGNOSIS REQUIRED):   bilat hand pain    Preferred imaging location?:    [place]    No orders of the defined types were placed in this encounter.    Discussed warning signs or symptoms. Please see discharge instructions. Patient expresses understanding.   The above documentation has been reviewed and is accurate and complete Artist Lloyd, M.D.

## 2023-10-15 NOTE — Patient Instructions (Addendum)
 Thank you for coming in today.   You received an injection today. Seek immediate medical attention if the joint becomes red, extremely painful, or is oozing fluid.   Please get an Xray today before you leave   See you back Friday at 11:30AM

## 2023-10-17 ENCOUNTER — Ambulatory Visit: Admitting: Family Medicine

## 2023-10-17 VITALS — BP 132/78 | HR 80 | Ht 67.0 in

## 2023-10-17 DIAGNOSIS — M25561 Pain in right knee: Secondary | ICD-10-CM

## 2023-10-17 DIAGNOSIS — M25562 Pain in left knee: Secondary | ICD-10-CM | POA: Diagnosis not present

## 2023-10-17 MED ORDER — HYDROCODONE-ACETAMINOPHEN 5-325 MG PO TABS: 1.0000 | ORAL_TABLET | Freq: Four times a day (QID) | ORAL | 0 refills | Status: AC | PRN

## 2023-10-17 NOTE — Progress Notes (Signed)
   LILLETTE Ileana Collet, PhD, LAT, ATC acting as a scribe for Artist Lloyd, MD.  [name] is a 83 y.o. female who presents to [facility] at [facility] today for f/u bilat knee and hand pain following a fall. Pt was last seen by Dr. Lloyd on 10/15/23 and was given a R knee steroid injection.  Today, pt reports she is feeling so much better. She got great relief from prior knee steroid injections. She thinks the hands/wrists are doing pretty good, no increased pain.   Dx imaging: 10/15/23 R & L hand and R & L knee XR  Pertinent review of systems: No fevers or chills  Relevant historical information: Hypertension   Exam:  BP 132/78   Pulse 80   Ht 5' 7 (1.702 m)   SpO2 98%   BMI 27.10 kg/m  General: Well Developed, well nourished, and in no acute distress.   MSK: Knees bilaterally mild abrasion normal motion and gait.    Lab and Radiology Results No results found for this or any previous visit (from the past 72 hours).      Assessment and Plan: 83 y.o. female with significant improvement in knee pain following steroid injection a few days ago.  Wrists are also doing pretty well.  Overall she is much improved from her previous visit and I feel confident she will be able to travel without significant pain.  We did talk about a backup plan.  She does have prednisone  that she could take if needed.  I additionally prescribed hydrocodone  that she can take if needed.  I do not expect her to actually take these medicines but they will be helpful while traveling internationally if she gets worse.   PDMP reviewed during this encounter. No orders of the defined types were placed in this encounter.  Meds ordered this encounter  Medications   HYDROcodone -acetaminophen  (NORCO/VICODIN) 5-325 MG tablet    Sig: Take 1 tablet by mouth every 6 (six) hours as needed.    Dispense:  8 tablet    Refill:  0     Discussed warning signs or symptoms. Please see discharge  instructions. Patient expresses understanding.   The above documentation has been reviewed and is accurate and complete Artist Lloyd, M.D.

## 2023-10-17 NOTE — Patient Instructions (Signed)
 Thank you for coming in today.   I've sent a prescription for Hydrocodone  to your pharmacy, so have with you, just in case.  Have a fabulous trip!  Check back as needed

## 2023-10-21 ENCOUNTER — Ambulatory Visit: Payer: Self-pay | Admitting: Family Medicine

## 2023-10-21 NOTE — Progress Notes (Signed)
 Left hand x-ray shows diffuse arthritis.

## 2023-10-21 NOTE — Progress Notes (Signed)
 Right knee x-ray shows mild arthritis.

## 2023-10-21 NOTE — Progress Notes (Signed)
 Left knee x-ray shows mild arthritis in the knee joint

## 2023-10-21 NOTE — Progress Notes (Signed)
 Right hand x-ray shows chronic arthritis

## 2023-11-02 ENCOUNTER — Encounter: Payer: Self-pay | Admitting: Internal Medicine

## 2023-11-02 NOTE — Patient Instructions (Addendum)
      Blood work was ordered.       Medications changes include :   None    A referral was ordered and someone will call you to schedule an appointment.     Return in about 6 months (around 05/03/2024) for Physical Exam.

## 2023-11-02 NOTE — Progress Notes (Unsigned)
 Subjective:    Patient ID: [name], female    DOB: 07/22/1940, 83 y.o.   MRN: [id number]     HPI [name] is here for follow up of her chronic medical problems.  She had a fall recently - tripped over her own feet.    Since she fell on her back and has not been able to stand straight -- she is stiff.    Joint inflammation - thought it was more osteoarthritis.  Has morning stiffness x 7 minutes.    Medications and allergies reviewed with patient and updated if appropriate.  Current Outpatient Medications on File Prior to Visit  Medication Sig Dispense Refill   amLODipine  (NORVASC ) 5 MG tablet TAKE ONE TABLET BY MOUTH DAILY 90 tablet 1   calcium -vitamin D  (OSCAL WITH D) 500-200 MG-UNIT tablet Calcium  500 + D (D3)  1 po qd otc     escitalopram  (LEXAPRO ) 5 MG tablet TAKE ONE TABLET BY MOUTH ONCE DAILY 30 tablet 3   HYDROcodone -acetaminophen  (NORCO/VICODIN) 5-325 MG tablet Take 1 tablet by mouth every 6 (six) hours as needed. 8 tablet 0   hydroxychloroquine (PLAQUENIL) 200 MG tablet Take 200 mg by mouth 2 (two) times daily.     rosuvastatin  (CRESTOR ) 5 MG tablet Take 1 tablet (5 mg total) by mouth daily. 90 tablet 0   triamcinolone  cream (KENALOG ) 0.1 % SMARTSIG:sparingly Topical Twice Daily PRN     Current Facility-Administered Medications on File Prior to Visit  Medication Dose Route Frequency Provider Last Rate Last Admin   [START ON 12/21/2023] denosumab  (PROLIA ) injection 60 mg  60 mg Subcutaneous Once Geofm Glade PARAS, MD         Review of Systems  Constitutional:  Negative for fever.  Respiratory:  Negative for cough, shortness of breath and wheezing.   Cardiovascular:  Negative for chest pain, palpitations and leg swelling.  Genitourinary:        Urinary incontinence  Neurological:  Negative for light-headedness and headaches.       Objective:   Vitals:   11/03/23 1304  BP: 124/78  Pulse: 63  Temp: 98 F (36.7 C)  SpO2: 96%   BP Readings from  Last 3 Encounters:  11/03/23 124/78  10/17/23 132/78  10/15/23 132/78   Wt Readings from Last 3 Encounters:  11/03/23 172 lb (78 kg)  09/17/23 173 lb (78.5 kg)  08/18/23 173 lb (78.5 kg)   Body mass index is 26.94 kg/m.    Physical Exam Constitutional:      General: She is not in acute distress.    Appearance: Normal appearance.  HENT:     Head: Normocephalic and atraumatic.  Eyes:     Conjunctiva/sclera: Conjunctivae normal.  Cardiovascular:     Rate and Rhythm: Normal rate and regular rhythm.     Heart sounds: Normal heart sounds.  Pulmonary:     Effort: Pulmonary effort is normal. No respiratory distress.     Breath sounds: Normal breath sounds. No wheezing.  Musculoskeletal:     Cervical back: Neck supple.     Right lower leg: No edema.     Left lower leg: No edema.  Lymphadenopathy:     Cervical: No cervical adenopathy.  Skin:    General: Skin is warm and dry.     Findings: No rash.  Neurological:     Mental Status: She is alert. Mental status is at baseline.  Psychiatric:        Mood and Affect:  Mood normal.        Behavior: Behavior normal.        Lab Results  Component Value Date   WBC 3.2 (L) 05/13/2023   HGB 12.9 05/13/2023   HCT 38.8 05/13/2023   PLT 268.0 05/13/2023   GLUCOSE 83 09/17/2023   CHOL 239 (H) 05/13/2023   TRIG 50.0 05/13/2023   HDL 90.20 05/13/2023   LDLDIRECT 149.6 03/19/2012   LDLCALC 139 (H) 05/13/2023   ALT 16 09/17/2023   AST 17 09/17/2023   NA 140 09/17/2023   K 4.0 09/17/2023   CL 103 09/17/2023   CREATININE 0.55 09/17/2023   BUN 16 09/17/2023   CO2 28 09/17/2023   TSH 6.65 (H) 05/13/2023   HGBA1C 5.6 07/16/2021     Assessment & Plan:    See Problem List for Assessment and Plan of chronic medical problems.

## 2023-11-03 ENCOUNTER — Ambulatory Visit: Admitting: Internal Medicine

## 2023-11-03 VITALS — BP 124/78 | HR 63 | Temp 98.0°F | Ht 67.0 in | Wt 172.0 lb

## 2023-11-03 DIAGNOSIS — R5381 Other malaise: Secondary | ICD-10-CM

## 2023-11-03 DIAGNOSIS — E785 Hyperlipidemia, unspecified: Secondary | ICD-10-CM | POA: Diagnosis not present

## 2023-11-03 DIAGNOSIS — M1380 Other specified arthritis, unspecified site: Secondary | ICD-10-CM

## 2023-11-03 DIAGNOSIS — M8000XD Age-related osteoporosis with current pathological fracture, unspecified site, subsequent encounter for fracture with routine healing: Secondary | ICD-10-CM

## 2023-11-03 DIAGNOSIS — F419 Anxiety disorder, unspecified: Secondary | ICD-10-CM | POA: Diagnosis not present

## 2023-11-03 DIAGNOSIS — E559 Vitamin D deficiency, unspecified: Secondary | ICD-10-CM

## 2023-11-03 DIAGNOSIS — R2689 Other abnormalities of gait and mobility: Secondary | ICD-10-CM | POA: Diagnosis not present

## 2023-11-03 DIAGNOSIS — E038 Other specified hypothyroidism: Secondary | ICD-10-CM

## 2023-11-03 DIAGNOSIS — I10 Essential (primary) hypertension: Secondary | ICD-10-CM

## 2023-11-03 LAB — COMPREHENSIVE METABOLIC PANEL WITH GFR
ALT: 16 U/L (ref 0–35)
AST: 17 U/L (ref 0–37)
Albumin: 4.2 g/dL (ref 3.5–5.2)
Alkaline Phosphatase: 51 U/L (ref 39–117)
BUN: 17 mg/dL (ref 6–23)
CO2: 29 meq/L (ref 19–32)
Calcium: 8.8 mg/dL (ref 8.4–10.5)
Chloride: 105 meq/L (ref 96–112)
Creatinine, Ser: 0.71 mg/dL (ref 0.40–1.20)
GFR: 79.62 mL/min (ref >60.00)
Glucose, Bld: 89 mg/dL (ref 70–99)
Potassium: 4.3 meq/L (ref 3.5–5.1)
Sodium: 140 meq/L (ref 135–145)
Total Bilirubin: 0.5 mg/dL (ref 0.2–1.2)
Total Protein: 7.1 g/dL (ref 6.0–8.3)

## 2023-11-03 LAB — LIPID PANEL
Cholesterol: 196 mg/dL (ref 0–200)
HDL: 87.2 mg/dL (ref >39.00)
LDL Cholesterol: 87 mg/dL (ref 0–99)
NonHDL: 108.99
Total CHOL/HDL Ratio: 2
Triglycerides: 108 mg/dL (ref 0.0–149.0)
VLDL: 21.6 mg/dL (ref 0.0–40.0)

## 2023-11-03 LAB — VITAMIN D 25 HYDROXY (VIT D DEFICIENCY, FRACTURES): VITD: 27.39 ng/mL — ABNORMAL LOW (ref 30.00–100.00)

## 2023-11-03 LAB — TSH: TSH: 2.66 u[IU]/mL (ref 0.35–5.50)

## 2023-11-03 NOTE — Assessment & Plan Note (Signed)
 Chronic Following with rheumatology Taking Plaquenil  controlled

## 2023-11-03 NOTE — Assessment & Plan Note (Signed)
 Chronic Has had a few falls Referral to PT for balance and strength

## 2023-11-03 NOTE — Assessment & Plan Note (Addendum)
 Chronic DEXA up-to-date History of T12 compression fracture Continue regular exercise Continue taking calcium  and vitamin d  4000 units daily Continue Prolia  every 6 months Check vitamin D  level, PTH, TSH, CMP

## 2023-11-03 NOTE — Assessment & Plan Note (Signed)
 Chronic BP well controlled  monitor BP at home CMP Continue amlodipine  5 mg daily

## 2023-11-03 NOTE — Assessment & Plan Note (Signed)
Chronic Controlled, Stable Continue Lexapro 5 mg daily 

## 2023-11-03 NOTE — Assessment & Plan Note (Signed)
Chronic Continue vitamin D supplementation Check vitamin d level

## 2023-11-03 NOTE — Assessment & Plan Note (Addendum)
 Chronic Has had a few falls Referral to PT for balance and strength

## 2023-11-03 NOTE — Assessment & Plan Note (Signed)
 Chronic Regular exercise and healthy diet encouraged Lipids controlled Check CMP, lipid panel Continue Crestor  5 mg 3 times a week

## 2023-11-03 NOTE — Assessment & Plan Note (Signed)
 Chronic last TSH within normal range, but history of TSH with an elevated Check tsh

## 2023-11-05 LAB — PTH, INTACT AND CALCIUM
Calcium: 9.1 mg/dL (ref 8.6–10.4)
PTH: 45 pg/mL (ref 16–77)

## 2023-11-06 ENCOUNTER — Ambulatory Visit: Payer: Self-pay | Admitting: Internal Medicine

## 2023-11-06 MED ORDER — VITAMIN D (ERGOCALCIFEROL) 1.25 MG (50000 UNIT) PO CAPS: 50000.0000 [IU] | ORAL_CAPSULE | ORAL | 0 refills | Status: AC

## 2023-11-10 DIAGNOSIS — H35371 Puckering of macula, right eye: Secondary | ICD-10-CM | POA: Diagnosis not present

## 2023-11-10 DIAGNOSIS — H2513 Age-related nuclear cataract, bilateral: Secondary | ICD-10-CM | POA: Diagnosis not present

## 2023-11-10 DIAGNOSIS — H43813 Vitreous degeneration, bilateral: Secondary | ICD-10-CM | POA: Diagnosis not present

## 2023-11-10 DIAGNOSIS — Z79899 Other long term (current) drug therapy: Secondary | ICD-10-CM | POA: Diagnosis not present

## 2023-11-20 ENCOUNTER — Telehealth: Payer: Self-pay

## 2023-11-20 NOTE — Telephone Encounter (Signed)
 Prolia VOB initiated via [email or web address]  Next Prolia inj DUE: NOW

## 2023-11-21 ENCOUNTER — Other Ambulatory Visit (HOSPITAL_COMMUNITY): Payer: Self-pay

## 2023-11-21 ENCOUNTER — Other Ambulatory Visit: Payer: Self-pay | Admitting: Medical Genetics

## 2023-11-21 DIAGNOSIS — Z006 Encounter for examination for normal comparison and control in clinical research program: Secondary | ICD-10-CM

## 2023-11-21 NOTE — Telephone Encounter (Signed)
 Pt ready for scheduling for PROLIA  on or after : 11/21/23  Option# 1: Buy/Bill (Office supplied medication)  Out-of-pocket cost due at time of clinic visit: $372  Number of injection/visits approved: 2  Primary: [employer] Prolia  co-insurance: *UNDISCLOSED* (Following Medicare Advantage guideline: 20% coinsurance) Admin fee co-insurance: $40  Secondary: --- Prolia  co-insurance:  Admin fee co-insurance:   Medical Benefit Details: Date Benefits were checked: 11/21/23 Deductible: NO/ Coinsurance: 20%/ Admin Fee: $40  Prior Auth: APPROVED PA# [id number] Expiration Date: 05/09/23-01/21/24  # of doses approved: 2 ----------------------------------------------------------------------- Option# 2- Med Obtained from pharmacy:  Pharmacy benefit: Copay $64 (Paid to pharmacy) Admin Fee: $40 (Pay at clinic)  Prior Auth: N/A PA# Expiration Date:   # of doses approved:   If patient wants fill through the pharmacy benefit please send prescription to: [facility], and include estimated need by date in rx notes. Pharmacy will ship medication directly to the office.  Patient NOT eligible for Prolia  Copay Card. Copay Card can make patient's cost as little as $25. Link to apply: [email or web address]://[email or web address]  ** This summary of benefits is an estimation of the patient's out-of-pocket cost. Exact cost may very based on individual plan coverage.

## 2023-11-21 NOTE — Telephone Encounter (Signed)
 SABRA

## 2023-11-24 ENCOUNTER — Other Ambulatory Visit: Payer: Self-pay | Admitting: Internal Medicine

## 2023-12-08 DIAGNOSIS — N182 Chronic kidney disease, stage 2 (mild): Secondary | ICD-10-CM | POA: Diagnosis not present

## 2023-12-08 DIAGNOSIS — E039 Hypothyroidism, unspecified: Secondary | ICD-10-CM | POA: Diagnosis not present

## 2023-12-08 DIAGNOSIS — F419 Anxiety disorder, unspecified: Secondary | ICD-10-CM | POA: Diagnosis not present

## 2023-12-08 DIAGNOSIS — M8580 Other specified disorders of bone density and structure, unspecified site: Secondary | ICD-10-CM | POA: Diagnosis not present

## 2023-12-08 DIAGNOSIS — M069 Rheumatoid arthritis, unspecified: Secondary | ICD-10-CM | POA: Diagnosis not present

## 2023-12-08 DIAGNOSIS — F325 Major depressive disorder, single episode, in full remission: Secondary | ICD-10-CM | POA: Diagnosis not present

## 2023-12-08 DIAGNOSIS — I129 Hypertensive chronic kidney disease with stage 1 through stage 4 chronic kidney disease, or unspecified chronic kidney disease: Secondary | ICD-10-CM | POA: Diagnosis not present

## 2023-12-08 DIAGNOSIS — E559 Vitamin D deficiency, unspecified: Secondary | ICD-10-CM | POA: Diagnosis not present

## 2023-12-08 DIAGNOSIS — E785 Hyperlipidemia, unspecified: Secondary | ICD-10-CM | POA: Diagnosis not present

## 2023-12-11 ENCOUNTER — Encounter (HOSPITAL_BASED_OUTPATIENT_CLINIC_OR_DEPARTMENT_OTHER): Payer: Self-pay | Admitting: Physical Therapy

## 2023-12-15 ENCOUNTER — Ambulatory Visit (HOSPITAL_BASED_OUTPATIENT_CLINIC_OR_DEPARTMENT_OTHER): Admitting: Physical Therapy

## 2024-01-20 ENCOUNTER — Other Ambulatory Visit (HOSPITAL_BASED_OUTPATIENT_CLINIC_OR_DEPARTMENT_OTHER): Payer: Self-pay

## 2024-01-20 MED ORDER — FLUZONE HIGH-DOSE 0.5 ML IM SUSY
0.5000 mL | PREFILLED_SYRINGE | Freq: Once | INTRAMUSCULAR | 0 refills | Status: AC
  Filled 2024-01-20: qty 0.5, 1d supply, fill #0

## 2024-01-29 ENCOUNTER — Other Ambulatory Visit: Payer: Self-pay | Admitting: Internal Medicine

## 2024-01-29 ENCOUNTER — Encounter: Payer: Self-pay | Admitting: Internal Medicine

## 2024-02-02 ENCOUNTER — Encounter: Payer: Self-pay | Admitting: *Deleted

## 2024-02-04 ENCOUNTER — Ambulatory Visit: Admitting: Podiatry

## 2024-02-06 ENCOUNTER — Other Ambulatory Visit: Payer: Self-pay | Admitting: Internal Medicine

## 2024-02-11 ENCOUNTER — Ambulatory Visit: Admitting: Podiatry
